# Patient Record
Sex: Female | Born: 1975
Health system: Southern US, Community
[De-identification: ages and names within clinical notes are randomized; demographics above are authoritative.]

## PROBLEM LIST (undated history)

## (undated) DIAGNOSIS — F419 Anxiety disorder, unspecified: Secondary | ICD-10-CM

## (undated) DIAGNOSIS — K219 Gastro-esophageal reflux disease without esophagitis: Secondary | ICD-10-CM

## (undated) DIAGNOSIS — I10 Essential (primary) hypertension: Secondary | ICD-10-CM

## (undated) DIAGNOSIS — D649 Anemia, unspecified: Secondary | ICD-10-CM

## (undated) DIAGNOSIS — Z862 Personal history of diseases of the blood and blood-forming organs and certain disorders involving the immune mechanism: Secondary | ICD-10-CM

## (undated) DIAGNOSIS — E785 Hyperlipidemia, unspecified: Secondary | ICD-10-CM

## (undated) DIAGNOSIS — M109 Gout, unspecified: Secondary | ICD-10-CM

## (undated) DIAGNOSIS — R519 Headache, unspecified: Secondary | ICD-10-CM

## (undated) HISTORY — DX: Anemia, unspecified: D64.9

## (undated) HISTORY — PX: TUBAL LIGATION: SHX77

---

## 1997-05-11 ENCOUNTER — Inpatient Hospital Stay (HOSPITAL_COMMUNITY): Admission: AD | Admit: 1997-05-11 | Discharge: 1997-05-11 | Payer: Self-pay | Admitting: Obstetrics

## 1997-06-28 ENCOUNTER — Inpatient Hospital Stay (HOSPITAL_COMMUNITY): Admission: EM | Admit: 1997-06-28 | Discharge: 1997-06-28 | Payer: Self-pay | Admitting: Obstetrics

## 1998-04-14 ENCOUNTER — Inpatient Hospital Stay (HOSPITAL_COMMUNITY): Admission: AD | Admit: 1998-04-14 | Discharge: 1998-04-14 | Payer: Self-pay | Admitting: Obstetrics

## 1998-07-24 ENCOUNTER — Inpatient Hospital Stay (HOSPITAL_COMMUNITY): Admission: EM | Admit: 1998-07-24 | Discharge: 1998-07-24 | Payer: Self-pay | Admitting: Obstetrics

## 1998-08-16 ENCOUNTER — Encounter: Payer: Self-pay | Admitting: Emergency Medicine

## 1998-08-16 ENCOUNTER — Emergency Department (HOSPITAL_COMMUNITY): Admission: EM | Admit: 1998-08-16 | Discharge: 1998-08-16 | Payer: Self-pay | Admitting: Emergency Medicine

## 1999-07-23 ENCOUNTER — Emergency Department (HOSPITAL_COMMUNITY): Admission: EM | Admit: 1999-07-23 | Discharge: 1999-07-23 | Payer: Self-pay | Admitting: Emergency Medicine

## 1999-08-07 ENCOUNTER — Other Ambulatory Visit: Admission: RE | Admit: 1999-08-07 | Discharge: 1999-08-07 | Payer: Self-pay | Admitting: Obstetrics

## 2000-01-22 ENCOUNTER — Inpatient Hospital Stay: Admission: AD | Admit: 2000-01-22 | Discharge: 2000-01-22 | Payer: Self-pay | Admitting: Obstetrics

## 2000-03-02 ENCOUNTER — Inpatient Hospital Stay (HOSPITAL_COMMUNITY): Admission: AD | Admit: 2000-03-02 | Discharge: 2000-03-02 | Payer: Self-pay | Admitting: Obstetrics

## 2000-03-17 ENCOUNTER — Inpatient Hospital Stay (HOSPITAL_COMMUNITY): Admission: AD | Admit: 2000-03-17 | Discharge: 2000-03-17 | Payer: Self-pay | Admitting: Obstetrics

## 2000-03-24 ENCOUNTER — Inpatient Hospital Stay (HOSPITAL_COMMUNITY): Admission: AD | Admit: 2000-03-24 | Discharge: 2000-03-24 | Payer: Self-pay | Admitting: Obstetrics

## 2000-03-28 ENCOUNTER — Inpatient Hospital Stay (HOSPITAL_COMMUNITY): Admission: AD | Admit: 2000-03-28 | Discharge: 2000-03-28 | Payer: Self-pay | Admitting: Obstetrics

## 2000-04-01 ENCOUNTER — Inpatient Hospital Stay (HOSPITAL_COMMUNITY): Admission: AD | Admit: 2000-04-01 | Discharge: 2000-04-03 | Payer: Self-pay | Admitting: Obstetrics and Gynecology

## 2000-04-01 ENCOUNTER — Encounter: Payer: Self-pay | Admitting: Obstetrics

## 2001-12-16 ENCOUNTER — Emergency Department (HOSPITAL_COMMUNITY): Admission: EM | Admit: 2001-12-16 | Discharge: 2001-12-16 | Payer: Self-pay | Admitting: Emergency Medicine

## 2002-04-08 HISTORY — PX: TUBAL LIGATION: SHX77

## 2002-06-15 ENCOUNTER — Inpatient Hospital Stay (HOSPITAL_COMMUNITY): Admission: AD | Admit: 2002-06-15 | Discharge: 2002-06-15 | Payer: Self-pay | Admitting: Obstetrics

## 2002-06-26 ENCOUNTER — Inpatient Hospital Stay (HOSPITAL_COMMUNITY): Admission: AD | Admit: 2002-06-26 | Discharge: 2002-06-26 | Payer: Self-pay | Admitting: Obstetrics

## 2002-07-27 ENCOUNTER — Encounter: Payer: Self-pay | Admitting: Obstetrics

## 2002-07-27 ENCOUNTER — Inpatient Hospital Stay (HOSPITAL_COMMUNITY): Admission: AD | Admit: 2002-07-27 | Discharge: 2002-07-27 | Payer: Self-pay | Admitting: Obstetrics

## 2003-02-03 ENCOUNTER — Inpatient Hospital Stay (HOSPITAL_COMMUNITY): Admission: AD | Admit: 2003-02-03 | Discharge: 2003-02-07 | Payer: Self-pay | Admitting: Obstetrics

## 2003-02-04 ENCOUNTER — Encounter (INDEPENDENT_AMBULATORY_CARE_PROVIDER_SITE_OTHER): Payer: Self-pay | Admitting: *Deleted

## 2004-05-15 ENCOUNTER — Emergency Department (HOSPITAL_COMMUNITY): Admission: EM | Admit: 2004-05-15 | Discharge: 2004-05-15 | Payer: Self-pay | Admitting: *Deleted

## 2004-12-08 ENCOUNTER — Inpatient Hospital Stay (HOSPITAL_COMMUNITY): Admission: AD | Admit: 2004-12-08 | Discharge: 2004-12-08 | Payer: Self-pay | Admitting: *Deleted

## 2005-09-27 ENCOUNTER — Inpatient Hospital Stay (HOSPITAL_COMMUNITY): Admission: AD | Admit: 2005-09-27 | Discharge: 2005-09-27 | Payer: Self-pay | Admitting: Obstetrics & Gynecology

## 2006-04-08 DIAGNOSIS — D259 Leiomyoma of uterus, unspecified: Secondary | ICD-10-CM

## 2006-05-15 ENCOUNTER — Emergency Department (HOSPITAL_COMMUNITY): Admission: EM | Admit: 2006-05-15 | Discharge: 2006-05-15 | Payer: Self-pay | Admitting: Family Medicine

## 2006-10-12 ENCOUNTER — Inpatient Hospital Stay (HOSPITAL_COMMUNITY): Admission: AD | Admit: 2006-10-12 | Discharge: 2006-10-12 | Payer: Self-pay | Admitting: Obstetrics & Gynecology

## 2006-10-15 ENCOUNTER — Encounter: Payer: Self-pay | Admitting: Obstetrics & Gynecology

## 2006-10-15 ENCOUNTER — Ambulatory Visit: Payer: Self-pay | Admitting: Obstetrics & Gynecology

## 2006-10-15 ENCOUNTER — Other Ambulatory Visit: Admission: RE | Admit: 2006-10-15 | Discharge: 2006-10-15 | Payer: Self-pay | Admitting: Obstetrics and Gynecology

## 2006-10-15 ENCOUNTER — Encounter: Payer: Self-pay | Admitting: Obstetrics and Gynecology

## 2006-10-17 ENCOUNTER — Ambulatory Visit (HOSPITAL_COMMUNITY): Admission: RE | Admit: 2006-10-17 | Discharge: 2006-10-17 | Payer: Self-pay | Admitting: Obstetrics and Gynecology

## 2006-10-29 ENCOUNTER — Ambulatory Visit: Payer: Self-pay | Admitting: Obstetrics & Gynecology

## 2006-12-31 ENCOUNTER — Emergency Department (HOSPITAL_COMMUNITY): Admission: EM | Admit: 2006-12-31 | Discharge: 2006-12-31 | Payer: Self-pay | Admitting: Family Medicine

## 2007-05-25 ENCOUNTER — Emergency Department (HOSPITAL_COMMUNITY): Admission: EM | Admit: 2007-05-25 | Discharge: 2007-05-25 | Payer: Self-pay | Admitting: Family Medicine

## 2007-10-02 ENCOUNTER — Emergency Department (HOSPITAL_COMMUNITY): Admission: EM | Admit: 2007-10-02 | Discharge: 2007-10-02 | Payer: Self-pay | Admitting: Emergency Medicine

## 2008-01-17 ENCOUNTER — Emergency Department (HOSPITAL_COMMUNITY): Admission: EM | Admit: 2008-01-17 | Discharge: 2008-01-17 | Payer: Self-pay | Admitting: Family Medicine

## 2008-02-18 ENCOUNTER — Ambulatory Visit: Payer: Self-pay | Admitting: Nurse Practitioner

## 2008-02-18 DIAGNOSIS — F17200 Nicotine dependence, unspecified, uncomplicated: Secondary | ICD-10-CM

## 2008-04-27 ENCOUNTER — Ambulatory Visit: Payer: Self-pay | Admitting: Nurse Practitioner

## 2008-04-27 ENCOUNTER — Encounter (INDEPENDENT_AMBULATORY_CARE_PROVIDER_SITE_OTHER): Payer: Self-pay | Admitting: Nurse Practitioner

## 2008-04-27 DIAGNOSIS — E669 Obesity, unspecified: Secondary | ICD-10-CM

## 2008-04-27 LAB — CONVERTED CEMR LAB
Bilirubin Urine: NEGATIVE
Glucose, Urine, Semiquant: NEGATIVE
KOH Prep: NEGATIVE
Protein, U semiquant: NEGATIVE
Specific Gravity, Urine: 1.01

## 2008-04-28 ENCOUNTER — Encounter (INDEPENDENT_AMBULATORY_CARE_PROVIDER_SITE_OTHER): Payer: Self-pay | Admitting: Nurse Practitioner

## 2008-04-28 LAB — CONVERTED CEMR LAB
ALT: 21 units/L (ref 0–35)
AST: 22 units/L (ref 0–37)
Albumin: 4.3 g/dL (ref 3.5–5.2)
Alkaline Phosphatase: 71 units/L (ref 39–117)
Basophils Absolute: 0.1 10*3/uL (ref 0.0–0.1)
Basophils Relative: 1 % (ref 0–1)
Eosinophils Absolute: 0.1 10*3/uL (ref 0.0–0.7)
LDL Cholesterol: 29 mg/dL (ref 0–99)
Lymphs Abs: 2.3 10*3/uL (ref 0.7–4.0)
MCHC: 28.7 g/dL — ABNORMAL LOW (ref 30.0–36.0)
MCV: 67.4 fL — ABNORMAL LOW (ref 78.0–100.0)
Neutrophils Relative %: 67 % (ref 43–77)
Platelets: 302 10*3/uL (ref 150–400)
Potassium: 3.9 meq/L (ref 3.5–5.3)
RDW: 18.6 % — ABNORMAL HIGH (ref 11.5–15.5)
Sodium: 143 meq/L (ref 135–145)
TSH: 1.366 microintl units/mL (ref 0.350–4.50)
Total Protein: 7.4 g/dL (ref 6.0–8.3)
VLDL: 32 mg/dL (ref 0–40)
WBC: 9 10*3/uL (ref 4.0–10.5)

## 2008-04-29 ENCOUNTER — Telehealth (INDEPENDENT_AMBULATORY_CARE_PROVIDER_SITE_OTHER): Payer: Self-pay | Admitting: Nurse Practitioner

## 2008-04-29 ENCOUNTER — Encounter (INDEPENDENT_AMBULATORY_CARE_PROVIDER_SITE_OTHER): Payer: Self-pay | Admitting: Nurse Practitioner

## 2008-04-29 DIAGNOSIS — D649 Anemia, unspecified: Secondary | ICD-10-CM

## 2008-04-29 LAB — CONVERTED CEMR LAB: Retic Ct Pct: 1.4 % (ref 0.4–3.1)

## 2008-06-07 ENCOUNTER — Emergency Department (HOSPITAL_COMMUNITY): Admission: EM | Admit: 2008-06-07 | Discharge: 2008-06-07 | Payer: Self-pay | Admitting: Family Medicine

## 2008-09-21 ENCOUNTER — Inpatient Hospital Stay (HOSPITAL_COMMUNITY): Admission: AD | Admit: 2008-09-21 | Discharge: 2008-09-21 | Payer: Self-pay | Admitting: Obstetrics & Gynecology

## 2008-10-26 ENCOUNTER — Ambulatory Visit: Payer: Self-pay | Admitting: Obstetrics & Gynecology

## 2009-01-18 ENCOUNTER — Ambulatory Visit: Payer: Self-pay | Admitting: Obstetrics and Gynecology

## 2009-01-18 ENCOUNTER — Encounter: Payer: Self-pay | Admitting: Obstetrics and Gynecology

## 2009-01-18 ENCOUNTER — Encounter: Payer: Self-pay | Admitting: Obstetrics & Gynecology

## 2009-01-18 LAB — CONVERTED CEMR LAB
MCHC: 29.2 g/dL — ABNORMAL LOW (ref 30.0–36.0)
Platelets: 356 10*3/uL (ref 150–400)
RDW: 18.2 % — ABNORMAL HIGH (ref 11.5–15.5)

## 2009-01-24 ENCOUNTER — Ambulatory Visit (HOSPITAL_COMMUNITY): Admission: RE | Admit: 2009-01-24 | Discharge: 2009-01-24 | Payer: Self-pay | Admitting: Family Medicine

## 2009-02-08 ENCOUNTER — Ambulatory Visit: Payer: Self-pay | Admitting: Obstetrics & Gynecology

## 2009-02-22 ENCOUNTER — Ambulatory Visit (HOSPITAL_COMMUNITY): Admission: RE | Admit: 2009-02-22 | Discharge: 2009-02-22 | Payer: Self-pay | Admitting: Obstetrics and Gynecology

## 2009-02-22 ENCOUNTER — Ambulatory Visit: Payer: Self-pay | Admitting: Obstetrics and Gynecology

## 2009-03-15 ENCOUNTER — Ambulatory Visit: Payer: Self-pay | Admitting: Obstetrics and Gynecology

## 2009-12-22 ENCOUNTER — Emergency Department (HOSPITAL_COMMUNITY): Admission: EM | Admit: 2009-12-22 | Discharge: 2009-12-22 | Payer: Self-pay | Admitting: Family Medicine

## 2010-03-09 ENCOUNTER — Emergency Department (HOSPITAL_COMMUNITY)
Admission: EM | Admit: 2010-03-09 | Discharge: 2010-03-09 | Payer: Self-pay | Source: Home / Self Care | Admitting: Emergency Medicine

## 2010-06-21 LAB — DIFFERENTIAL
Basophils Relative: 0 % (ref 0–1)
Eosinophils Relative: 2 % (ref 0–5)
Lymphocytes Relative: 22 % (ref 12–46)
Monocytes Relative: 4 % (ref 3–12)
Neutro Abs: 6.2 10*3/uL (ref 1.7–7.7)
Neutrophils Relative %: 72 % (ref 43–77)

## 2010-06-21 LAB — COMPREHENSIVE METABOLIC PANEL
AST: 17 U/L (ref 0–37)
Albumin: 4 g/dL (ref 3.5–5.2)
BUN: 7 mg/dL (ref 6–23)
Calcium: 9.2 mg/dL (ref 8.4–10.5)
Chloride: 105 mEq/L (ref 96–112)
Creatinine, Ser: 0.66 mg/dL (ref 0.4–1.2)
GFR calc Af Amer: >60 mL/min (ref >60)
Total Bilirubin: 0.6 mg/dL (ref 0.3–1.2)
Total Protein: 7.5 g/dL (ref 6.0–8.3)

## 2010-06-21 LAB — POCT URINALYSIS DIPSTICK
Bilirubin Urine: NEGATIVE
Glucose, UA: NEGATIVE mg/dL
Nitrite: NEGATIVE
Urobilinogen, UA: 0.2 mg/dL (ref 0.0–1.0)
pH: 6.5 (ref 5.0–8.0)

## 2010-06-21 LAB — CBC
MCH: 19.8 pg — ABNORMAL LOW (ref 26.0–34.0)
MCV: 65.8 fL — ABNORMAL LOW (ref 78.0–100.0)
Platelets: 383 10*3/uL (ref 150–400)
RBC: 4.65 MIL/uL (ref 3.87–5.11)
RDW: 18.4 % — ABNORMAL HIGH (ref 11.5–15.5)

## 2010-07-11 LAB — CBC
HCT: 32.3 % — ABNORMAL LOW (ref 36.0–46.0)
Hemoglobin: 10.2 g/dL — ABNORMAL LOW (ref 12.0–15.0)
MCHC: 31.4 g/dL (ref 30.0–36.0)
MCV: 71.3 fL — ABNORMAL LOW (ref 78.0–100.0)
RBC: 4.53 MIL/uL (ref 3.87–5.11)
WBC: 9.2 10*3/uL (ref 4.0–10.5)

## 2010-07-11 LAB — PREGNANCY, URINE: Preg Test, Ur: NEGATIVE

## 2010-08-21 NOTE — Group Therapy Note (Signed)
NAME:  Rebecca Joseph, Rebecca Joseph NO.:  [id number]   MEDICAL RECORD NO.:  [id number]          PATIENT TYPE:  WOC   LOCATION:  [facility]                   FACILITY:  [facility]   PHYSICIAN:  Elsie Lincoln, MD      DATE OF BIRTH:  [birth year]   DATE OF SERVICE:  10/15/2006                                  CLINIC NOTE   The patient is a 35 year old G4, para 3-0-1-3 who is coming here for  workup of menorrhagia.  The patient complains she has periods that last  7 or 8 days, very heavy flow, with severe pain.  She also has some  bleeding between periods.  She has never had this worked up before.  She  also has irregular cycles.  Her last cycle was September 08, 2006, and she is  probably going to start sometime today she thinks.  The patient is also  being treated with antibiotics for a urinary tract infection from the  MAU.  She has recurrent UTIs; however, she does wipe inappropriately  back to front.  We went over this, and she is going to wipe front to  back.  The patient uses tubal ligation for birth control.  She has not  had a Pap smear in many years.  She thinks that she gets them in the MAU  when she goes for pelvics; however, they do not do Pap smears in that  facility.   PAST MEDICAL HISTORY:  Denies all medical problems.   PAST SURGICAL HISTORY:  Bilateral tubal ligation.   OB HISTORY:  NSVD x2.  Miscarriage x1.  C-section x1.   GYN HISTORY:  No history of ovarian cysts, fibroid tumors, or Pap smears  that are not normal.  She has had gonorrhea and Trichomonas in the past.  Again, she uses a tubal for birth control.   FAMILY HISTORY:  Father is positive for diabetes, father is positive for  heart attack, and father is positive for high blood pressure.   SOCIAL HISTORY:  Minimal alcohol intake.  No tobacco.  No drugs.  No  history of abuse.   REVIEW OF SYSTEMS:  Positive for fevers, night sweats, hot flashes, and  recurrent urinary tract infections.   MEDICATIONS:   Antibiotic.   ALLERGIES:  NAPROXEN.   PHYSICAL EXAMINATION:  GENERAL:  Well-nourished, well-developed, no  apparent distress.  VITAL SIGNS:  Temperature 97.5, pulse 95, blood pressure 119/81.  Height  5 feet 6 inches, weight 218 pounds.  HEENT:  Normocephalic, atraumatic.  ABDOMEN:  Obese, soft, nontender.  No organomegaly.  No hernia.  GENITALIA:  Tanner 5.  Vagina:  Pink with normal rugae.  Cervix:  Large  with very large nabothian cyst on anterior lip.  Uterus anteverted.  Adnexa with no masses.  Uterus sounds to 6 cm.   ASSESSMENT AND PLAN:  35 year old female with menometrorrhagia, painful  periods.  1. Pap smear, cultures.  2. Endometrial biopsy done after negative urinary chorionic      gonadotropins and informed consent.  Again, the uterus sounds to 6      cm which hopefully are in the uterine canal and not just in the  endocervical canal.  Two passes were done.  There was abundant      mucus.  3.  Get a complete blood count today to see if she is      anemic.  3. Transvaginal ultrasound to rule out fibroids, polyps.  4. Return to clinic in 2 weeks.  5. The patient might be a candidate for intrauterine device or      dilatation and curettage/hysteroscopy if polyp or fibroid is noted.      The patient could also be a candidate for ablation.           ______________________________  Elsie Lincoln, MD     KL/MEDQ  D:  10/15/2006  T:  10/16/2006  Job:  [id number]

## 2010-08-24 NOTE — H&P (Signed)
   NAMECALIOPE, RUPPERT                      ACCOUNT NO.:  [id number]   MEDICAL RECORD NO.:  [id number]                   PATIENT TYPE:  INP   LOCATION:  9122                                 [facility]ILITY[facility]  [facility]   PHYSICIAN:  Kathreen Cosier, M.D.           DATE OF BIRTH:  Nov 15, 1975   DATE OF ADMISSION:  02/03/2003  DATE OF DISCHARGE:                                HISTORY & PHYSICAL   HISTORY OF PRESENT ILLNESS:  The patient is a 35 year old gravida 4, para 2-  0-1-2, [facility] February 16, 2003.  Came to the hospital because of decreased  fetal movements past 24 hours.  It was known that the fetus had an  arrhythmia in the later part of the pregnancy and the fetal heart usually  ranged between 140-160.  She was also seen by [facility] perin[facility]ology in  consultation.  The patient was not contracting.  However, we were unable to  monitor the baby because of the severity of the arrhythmia and because of  that fact with decreased movement, it was decided to deliver her by cesarean  section.   PHYSICAL EXAMINATION:  GENERAL:  Well-developed female in no distress.  HEENT:  Negative.  LUNGS:  Clear.  HEART:  Regular rhythm.  No murmurs or gallops.  BREASTS:  Negative.  ABDOMEN:  Term size.  EXTREMITIES:  Negative.                                               Kathreen Cosier, M.D.    BAM/MEDQ  D:  02/04/2003  T:  02/04/2003  Job:  [id number]

## 2010-08-24 NOTE — Discharge Summary (Signed)
   Rebecca Joseph, Rebecca Joseph                      ACCOUNT NO.:  [id number]   MEDICAL RECORD NO.:  [id number]                   PATIENT TYPE:  INP   LOCATION:  9122                                 [facility]   PHYSICIAN:  Kathreen Cosier, M.D.           DATE OF BIRTH:  March 16, 1976   DATE OF ADMISSION:  02/03/2003  DATE OF DISCHARGE:  02/07/2003                                 DISCHARGE SUMMARY   HISTORY OF PRESENT ILLNESS:  The patient is a 35 year old gravida 4, para 2-  0-1-2, [facility] February 16, 2003 who came to the triage with decreased fetal  movement.  We knew there was a fetal arrhythmia.  However, the arrhythmia  was more severe and had been seen at [facility] in consult.  We  were unable to monitor the baby because of the severity of the arrhythmia  and it was decided that she would be delivered by cesarean section because  of the severe nature of this arrhythmia.  She underwent a primary low  transverse cesarean section and tubal ligation.  She had a female, Apgar 8/8  with a nuchal cord.  Baby weighed 7 pounds 10 ounces.  She also had tubal  ligation performed.  On admission her hemoglobin was 9.1 and postoperative  6.8.  She was asymptomatic.  Did well and was discharged home on the third  postoperative day ambulatory, on a regular diet, on Tylox for pain and  ferrous sulfate for her anemia.   DISCHARGE DIAGNOSES:  Status post elective cesarean section at 38 weeks  because of severe cardiac arrhythmia in the fetus.                                               Kathreen Cosier, M.D.    BAM/MEDQ  D:  02/07/2003  T:  02/07/2003  Job:  [id number]

## 2010-08-24 NOTE — Op Note (Signed)
   Rebecca Joseph, Rebecca Joseph NO.:  [id number]   MEDICAL RECORD NO.:  [id number]                   PATIENT TYPE:  INP   LOCATION:  9199                                 [facility]   PHYSICIAN:  Kathreen Cosier, M.D.           DATE OF BIRTH:  1975/07/01   DATE OF PROCEDURE:  02/04/2003  DATE OF DISCHARGE:                                 OPERATIVE REPORT   SURGEON:  Kathreen Cosier, M.D.   ASSISTANT:  Charles A. Clearance Coots, M.D.   ANESTHESIA:  Spinal.   DESCRIPTION OF PROCEDURE:  The patient was placed on the operating table in  the supine position.  Abdomen prepped and draped.  Bladder emptied with a  Foley catheter.  A transverse suprapubic incision made, carried down to the  rectus fascia.  The fascia cleaned and incised the length of the incision.  Recti muscles retracted laterally, the peritoneum incised longitudinally.  A  transverse incision made in the visceral peritoneum above the bladder and  the bladder mobilized inferiorly.  A transverse low uterine incision was  made with scissors.  The patient delivered from the LOA position of a female,  Apgar 8 and 8, weighing 7 pounds 10 ounces.  There was a nuchal cord which  was loose.  The placenta was posterior and removed manually.  The uterine  cavity cleaned with dry laps.  The uterine incision closed in one layer with  a continuous suture of #1 chromic.  Hemostasis was satisfactory.  The  bladder flap reattached with 2-0 chromic.  The uterus was well-contracted,  tubes and ovaries were normal.  The right tube grasped in the midportion  with a Babcock clamp, 0 plain suture placed in the mesosalpinx below the  portion of the tube which had been clamped.  This was tied and the tube cut.  Hemostasis was satisfactory.  The procedure done in a similar fashion on the  other side.  Lap and sponge counts correct.  Abdomen closed in layers, the  peritoneum with a continuous suture of 0 chromic, the  fascia with a  continuous suture of 0 Dexon, and the skin closed with subcuticular stitch  of 3-0 Monocryl.  Blood loss 600 mL.  The patient tolerated the procedure  well, taken to the recovery room in good condition.                                               Kathreen Cosier, M.D.    BAM/MEDQ  D:  02/04/2003  T:  02/04/2003  Job:  [id number]

## 2011-01-03 LAB — POCT CARDIAC MARKERS
CKMB, poc: 1.1
Troponin i, poc: <0.05

## 2011-01-08 LAB — POCT URINALYSIS DIP (DEVICE)
Bilirubin Urine: NEGATIVE
Glucose, UA: NEGATIVE
Nitrite: NEGATIVE
Urobilinogen, UA: 0.2

## 2011-01-08 LAB — POCT PREGNANCY, URINE: Preg Test, Ur: NEGATIVE

## 2011-01-08 LAB — URINE CULTURE: Colony Count: 85000

## 2011-01-22 LAB — URINALYSIS, ROUTINE W REFLEX MICROSCOPIC
Bilirubin Urine: NEGATIVE
Glucose, UA: NEGATIVE
Nitrite: NEGATIVE
Specific Gravity, Urine: 1.025
pH: 6

## 2011-01-22 LAB — URINE MICROSCOPIC-ADD ON

## 2011-08-20 ENCOUNTER — Ambulatory Visit (INDEPENDENT_AMBULATORY_CARE_PROVIDER_SITE_OTHER): Payer: Self-pay | Admitting: *Deleted

## 2011-08-20 VITALS — BP 134/97 | HR 99 | Temp 97.7°F | Ht 65.5 in | Wt 171.2 lb

## 2011-08-20 DIAGNOSIS — Z01419 Encounter for gynecological examination (general) (routine) without abnormal findings: Secondary | ICD-10-CM

## 2011-08-20 NOTE — Progress Notes (Signed)
No complaints today.  Pap Smear:    Pap smear completed today. Patients last Pap smear was 02/05/09 at the [facility] and normal. Prior Pap smear on 04/27/08 was normal. Per patient no history of abnormal Pap smears but stated she has a history of genital warts. Pap smear results above are in EPIC.  Physical exam: Breasts Breasts symmetrical. No skin abnormalities bilateral breasts. No nipple retraction bilateral breasts. No nipple discharge bilateral breasts. No lymphadenopathy. No lumps palpated bilateral breasts. No complaints of pain or tenderness on exam.     Pelvic/Bimanual   Ext Genitalia No lesions, no swelling and no discharge observed on external genitalia.         Vagina Vagina pink and normal texture. No lesions or discharge observed in vagina.          Cervix Cervix is present. Cervix pink and of normal texture. Small amount of blood observed at cervical os related to menstruation.    Uterus Uterus is present and palpable. Uterus in normal position and normal size.        Adnexae Bilateral ovaries present and palpable. No tenderness on palpation.          Rectovaginal No rectal exam completed today since patient had no rectal complaints. No skin abnormalities observed on exam.

## 2011-08-20 NOTE — Patient Instructions (Addendum)
Taught patient how to perform BSE. Let her know [facility] will cover Pap smears every 3 years unless has a history of abnormal Pap smears. Since patient has a history of genital warts recommended that she have a Pap smear at 1-2 years. Let her know about the free Pap smear screenings offered at the [facility] if would like a Pap smear at 1-2 years. Let patient know will follow up with her within the next couple weeks with results. Talked with patient about her elevated BP of 134/97. Per patient does not usually have a high BP. Recommended she have her BP taken several different times and keep a log of results. Gave patient a log to fill out with BP results. Told patient if BP continues to be elevated that she needs to follow up with PCP. Patient verbalized understanding.

## 2011-08-26 ENCOUNTER — Encounter: Payer: Self-pay | Admitting: *Deleted

## 2013-12-03 ENCOUNTER — Encounter (HOSPITAL_COMMUNITY): Payer: Self-pay | Admitting: Emergency Medicine

## 2013-12-03 ENCOUNTER — Emergency Department (HOSPITAL_COMMUNITY)
Admission: EM | Admit: 2013-12-03 | Discharge: 2013-12-03 | Disposition: A | Discharge status: Home / Self Care | Payer: Self-pay | Attending: Emergency Medicine | Admitting: Emergency Medicine

## 2013-12-03 DIAGNOSIS — J029 Acute pharyngitis, unspecified: Secondary | ICD-10-CM | POA: Insufficient documentation

## 2013-12-03 DIAGNOSIS — R599 Enlarged lymph nodes, unspecified: Secondary | ICD-10-CM | POA: Insufficient documentation

## 2013-12-03 DIAGNOSIS — R51 Headache: Secondary | ICD-10-CM | POA: Insufficient documentation

## 2013-12-03 DIAGNOSIS — J020 Streptococcal pharyngitis: Secondary | ICD-10-CM | POA: Insufficient documentation

## 2013-12-03 DIAGNOSIS — Z862 Personal history of diseases of the blood and blood-forming organs and certain disorders involving the immune mechanism: Secondary | ICD-10-CM | POA: Insufficient documentation

## 2013-12-03 LAB — RAPID STREP SCREEN (MED CTR MEBANE ONLY): STREPTOCOCCUS, GROUP A SCREEN (DIRECT): POSITIVE — AB

## 2013-12-03 MED ORDER — PENICILLIN G BENZATHINE 1200000 UNIT/2ML IM SUSP
1.2000 10*6.[IU] | Freq: Once | INTRAMUSCULAR | Status: AC
  Administered 2013-12-03: 1.2 10*6.[IU] via INTRAMUSCULAR
  Filled 2013-12-03: qty 2

## 2013-12-03 MED ORDER — LIDOCAINE VISCOUS 2 % MT SOLN: 20.0000 mL | OROMUCOSAL | Status: DC | PRN

## 2013-12-03 NOTE — ED Provider Notes (Signed)
  This was a shared visit with a mid-level provided (NP or PA).  Throughout the patient's course I was available for consultation/collaboration.  I saw the relevant labs and studies - I agree with the interpretation.  On my exam the patient was in no distress.  She had a patent airway, and although she had sore throat, with exudative lesions, was in and no systemic distress.       Carmin Muskrat, MD 12/03/13 [phone]

## 2013-12-03 NOTE — ED Provider Notes (Signed)
CSN: [id number]     Arrival date & time 12/03/13  0815 History   First MD Initiated Contact with Patient 12/03/13 [phone]     Chief Complaint  Patient presents with  . Headache  . Sore Throat     (Consider location/radiation/quality/duration/timing/severity/associated sxs/prior Treatment) HPI Comments: Patient presents today with a chief complaint of headache and sore throat.  She reports that she began having a sore throat 2 days ago, which is gradually worsening.  She states that pain is worse with swallowing.  She has been taken Ibuprofen and OTC decongestant for her symptoms with mild relief.  She also reports that last evening she developed a frontal headache.  Headache gradual in onset.  Headache improved with Ibuprofen.  Headache similar to headache she has had in the past.  She reports associated subjective fever, chills, bilateral ear pain, and congestion.  She denies cough, chest pain, SOB, nausea, vomiting, rash, neck pain/stiffness, vision changes, or weakness.  She reports that her son was recently diagnosed with Strep Throat five days ago.    The history is provided by the patient.    Past Medical History  Diagnosis Date  . Anemia    Past Surgical History  Procedure Laterality Date  . Tubal ligation    . Cesarean section     Family History  Problem Relation Age of Onset  . Diabetes Father   . Hypertension Father   . Heart disease Father   . Hypertension Mother    History  Substance Use Topics  . Smoking status: Never Smoker   . Smokeless tobacco: Never Used  . Alcohol Use: 1.8 oz/week    3 Glasses of wine per week   OB History   Grav Para Term Preterm Abortions TAB SAB Ect Mult Living   4 3 3  1  1   3      Review of Systems  All other systems reviewed and are negative.     Allergies  Naproxen  Home Medications   Prior to Admission medications   Medication Sig Start Date End Date Taking? Authorizing Provider  ibuprofen (ADVIL,MOTRIN) 200 MG tablet  Take 400 mg by mouth every 6 (six) hours as needed for headache or moderate pain.   Yes Historical Provider, MD   BP 115/71  Pulse 98  Temp(Src) 98.4 F (36.9 C) (Oral)  Resp 14  Ht 5\' 5"  (1.651 m)  Wt 170 lb (77.111 kg)  BMI 28.29 kg/m2  SpO2 100%  LMP 10/30/2013 Physical Exam  Nursing note and vitals reviewed. Constitutional: She appears well-developed and well-nourished.  HENT:  Head: Normocephalic and atraumatic.  Right Ear: Tympanic membrane and ear canal normal.  Left Ear: Tympanic membrane and ear canal normal.  Nose: Nose normal.  Mouth/Throat: Uvula is midline. No trismus in the jaw. Oropharyngeal exudate and posterior oropharyngeal erythema present.  Bilateral tonsillar exudate with mild bilateral tonsillar enlargement. Uvula midline Mild erythema of oropharynx Normal voice phonation Patient handling secretions well, no drooling   Eyes: EOM are normal. Pupils are equal, round, and reactive to light.  Neck: Normal range of motion. Neck supple.  Cardiovascular: Normal rate, regular rhythm and normal heart sounds.   Pulmonary/Chest: Effort normal and breath sounds normal.  Musculoskeletal: Normal range of motion.  Lymphadenopathy:    She has cervical adenopathy.  Neurological: She is alert. She has normal strength. No cranial nerve deficit or sensory deficit. Gait normal.  Skin: Skin is warm and dry. No rash noted.  Psychiatric: She has  a normal mood and affect.    ED Course  Procedures (including critical care time) Labs Review Labs Reviewed  RAPID STREP SCREEN - Abnormal; Notable for the following:    Streptococcus, Group A Screen (Direct) POSITIVE (*)    All other components within normal limits    Imaging Review No results found.   EKG Interpretation None      MDM   Final diagnoses:  None   Patient presenting with sore throat and headache.  Rapid strep positive.  Patient given IM PCN in the ED.  No signs of Peritonsillar or Retropharyngeal  Abscess at this time.  Patient also complaining of a headache.  Headache gradual in onset.  Normal neurological exam.  No nuchal rigidity.  Similar to prior headache.  Headache relieved with Ibuprofen.  Therefore, do not feel that any imaging or further work up is indicated.  Patient stable for discharge.  Return precautions given.    Hyman Bible, PA-C 12/03/13 1025

## 2013-12-03 NOTE — ED Notes (Signed)
Pt states that her son recently has strep throat. Pt states that she has had a headache and sore throat for several days now. Pt states that she has had some yellow nasal drainage as well.  NAD noted.

## 2014-02-07 ENCOUNTER — Encounter (HOSPITAL_COMMUNITY): Payer: Self-pay | Admitting: Emergency Medicine

## 2015-09-08 ENCOUNTER — Emergency Department (HOSPITAL_COMMUNITY)
Admission: EM | Admit: 2015-09-08 | Discharge: 2015-09-08 | Disposition: A | Discharge status: Home / Self Care | Payer: Self-pay | Attending: Emergency Medicine | Admitting: Emergency Medicine

## 2015-09-08 ENCOUNTER — Encounter (HOSPITAL_COMMUNITY): Payer: Self-pay | Admitting: *Deleted

## 2015-09-08 DIAGNOSIS — Z79899 Other long term (current) drug therapy: Secondary | ICD-10-CM | POA: Insufficient documentation

## 2015-09-08 DIAGNOSIS — A084 Viral intestinal infection, unspecified: Secondary | ICD-10-CM | POA: Insufficient documentation

## 2015-09-08 DIAGNOSIS — R51 Headache: Secondary | ICD-10-CM | POA: Insufficient documentation

## 2015-09-08 LAB — COMPREHENSIVE METABOLIC PANEL
ALT: 13 U/L — ABNORMAL LOW (ref 14–54)
ANION GAP: 7 (ref 5–15)
AST: 19 U/L (ref 15–41)
Albumin: 3.4 g/dL — ABNORMAL LOW (ref 3.5–5.0)
Alkaline Phosphatase: 52 U/L (ref 38–126)
BUN: 8 mg/dL (ref 6–20)
CALCIUM: 8.8 mg/dL — AB (ref 8.9–10.3)
CHLORIDE: 104 mmol/L (ref 101–111)
CO2: 23 mmol/L (ref 22–32)
Creatinine, Ser: 0.8 mg/dL (ref 0.44–1.00)
GFR calc non Af Amer: >60 mL/min (ref >60)
Glucose, Bld: 114 mg/dL — ABNORMAL HIGH (ref 65–99)
Potassium: 3.2 mmol/L — ABNORMAL LOW (ref 3.5–5.1)
SODIUM: 134 mmol/L — AB (ref 135–145)
Total Bilirubin: 1 mg/dL (ref 0.3–1.2)
Total Protein: 7.4 g/dL (ref 6.5–8.1)

## 2015-09-08 LAB — URINALYSIS, ROUTINE W REFLEX MICROSCOPIC
Bilirubin Urine: NEGATIVE
Glucose, UA: NEGATIVE mg/dL
Ketones, ur: 15 mg/dL — AB
LEUKOCYTES UA: NEGATIVE
Nitrite: NEGATIVE
PROTEIN: 30 mg/dL — AB
SPECIFIC GRAVITY, URINE: 1.029 (ref 1.005–1.030)
pH: 5.5 (ref 5.0–8.0)

## 2015-09-08 LAB — TSH: TSH: 1.13 u[IU]/mL (ref 0.350–4.500)

## 2015-09-08 LAB — CBC
HCT: 31.1 % — ABNORMAL LOW (ref 36.0–46.0)
HEMOGLOBIN: 9 g/dL — AB (ref 12.0–15.0)
MCH: 20 pg — AB (ref 26.0–34.0)
MCHC: 28.9 g/dL — ABNORMAL LOW (ref 30.0–36.0)
MCV: 69.1 fL — ABNORMAL LOW (ref 78.0–100.0)
PLATELETS: 236 10*3/uL (ref 150–400)
RBC: 4.5 MIL/uL (ref 3.87–5.11)
RDW: 20.8 % — ABNORMAL HIGH (ref 11.5–15.5)
WBC: 10.8 10*3/uL — AB (ref 4.0–10.5)

## 2015-09-08 LAB — URINE MICROSCOPIC-ADD ON

## 2015-09-08 LAB — LIPASE, BLOOD: LIPASE: 27 U/L (ref 11–51)

## 2015-09-08 LAB — I-STAT BETA HCG BLOOD, ED (MC, WL, AP ONLY)

## 2015-09-08 LAB — I-STAT CG4 LACTIC ACID, ED: LACTIC ACID, VENOUS: 1.61 mmol/L (ref 0.5–2.0)

## 2015-09-08 MED ORDER — SODIUM CHLORIDE 0.9 % IV BOLUS (SEPSIS)
1000.0000 mL | Freq: Once | INTRAVENOUS | Status: AC
  Administered 2015-09-08: 1000 mL via INTRAVENOUS

## 2015-09-08 MED ORDER — POTASSIUM CHLORIDE CRYS ER 20 MEQ PO TBCR
40.0000 meq | EXTENDED_RELEASE_TABLET | Freq: Once | ORAL | Status: AC
  Administered 2015-09-08: 40 meq via ORAL
  Filled 2015-09-08: qty 2

## 2015-09-08 MED ORDER — DIPHENHYDRAMINE HCL 50 MG/ML IJ SOLN
12.5000 mg | Freq: Once | INTRAMUSCULAR | Status: AC
  Administered 2015-09-08: 12.5 mg via INTRAVENOUS
  Filled 2015-09-08: qty 1

## 2015-09-08 MED ORDER — ONDANSETRON HCL 4 MG/2ML IJ SOLN: 4.0000 mg | Freq: Once | INTRAMUSCULAR | Status: DC | PRN

## 2015-09-08 MED ORDER — SODIUM CHLORIDE 0.9 % IV BOLUS (SEPSIS): 1000.0000 mL | Freq: Once | INTRAVENOUS | Status: DC

## 2015-09-08 MED ORDER — METOCLOPRAMIDE HCL 5 MG/ML IJ SOLN
10.0000 mg | Freq: Once | INTRAMUSCULAR | Status: AC
  Administered 2015-09-08: 10 mg via INTRAVENOUS
  Filled 2015-09-08: qty 2

## 2015-09-08 NOTE — ED Provider Notes (Signed)
CSN: [id number]     Arrival date & time 09/08/15  [id number] History   First MD Initiated Contact with Patient 09/08/15 [phone]     Chief Complaint  Patient presents with  . Diarrhea  . Headache   (Consider location/radiation/quality/duration/timing/severity/associated sxs/prior Treatment) HPI 40 y.o. female with a hx of Anemia, presents to the Emergency Department today complaining of generalized abdominal pain since last night as well as headache. States pain felt like a cramping sensation that spans from one side of her abdomen to the other. No pain currently. No headache currently. States that she tried eating something last night that caused her pain to increase. No RUQ pain at that time. Mostly lower abdomen. Noted fever 101.2 at home. Also noted headache. No neck stiffness/rigidity. Has had diarrhea yesterday as well. No N/V. No recent ABX use. Pt able to tolerate PO. Of note, pt states that her mom has had similar symptoms with Viral gastroenteritis. No other symptoms noted.    Past Medical History  Diagnosis Date  . Anemia    Past Surgical History  Procedure Laterality Date  . Tubal ligation    . Cesarean section     Family History  Problem Relation Age of Onset  . Diabetes Father   . Hypertension Father   . Heart disease Father   . Hypertension Mother    Social History  Substance Use Topics  . Smoking status: Never Smoker   . Smokeless tobacco: Never Used  . Alcohol Use: 10.2 oz/week    3 Glasses of wine, 14 Shots of liquor per week     Comment: 2 glasses vodka per day   OB History    Gravida Para Term Preterm AB TAB SAB Ectopic Multiple Living   4 3 3  1  1   3      Review of Systems ROS reviewed and all are negative for acute change except as noted in the HPI.  Allergies  Naproxen  Home Medications   Prior to Admission medications   Medication Sig Start Date End Date Taking? Authorizing Provider  ibuprofen (ADVIL,MOTRIN) 200 MG tablet Take 400 mg by mouth every 6  (six) hours as needed for headache or moderate pain.    Historical Provider, MD  lidocaine (XYLOCAINE) 2 % solution Use as directed 20 mLs in the mouth or throat as needed for mouth pain. 12/03/13   Heather Laisure, PA-C   BP 134/99 mmHg  Pulse 147  Temp(Src) 99.7 F (37.6 C) (Oral)  Resp 16  Ht 5\' 3"  (1.6 m)  Wt 79.379 kg  BMI 31.01 kg/m2  SpO2 100%  LMP 08/08/2015   Physical Exam  Constitutional: She is oriented to person, place, and time. She appears well-developed and well-nourished.  Pt sitting comfortably. Non tremulous on exam. In NAD  HENT:  Head: Normocephalic and atraumatic.  Eyes: EOM are normal. Pupils are equal, round, and reactive to light.  Neck: Normal range of motion and full passive range of motion without pain. Neck supple. No spinous process tenderness and no muscular tenderness present. No rigidity. No tracheal deviation, no edema, no erythema and normal range of motion present. No Brudzinski's sign and no Kernig's sign noted.  Cardiovascular: Regular rhythm, normal heart sounds and intact distal pulses.  Tachycardia present.   No murmur heard. Pulmonary/Chest: Effort normal. No respiratory distress. She has no wheezes. She has no rales. She exhibits no tenderness.  Abdominal: Soft. Normal appearance and bowel sounds are normal. There is no tenderness. There is  no rigidity, no rebound, no guarding, no tenderness at McBurney's point and negative Murphy's sign.  Abdomen soft. Non peritoneal.   Musculoskeletal: Normal range of motion.  Neurological: She is alert and oriented to person, place, and time.  Skin: Skin is warm and dry.  Psychiatric: She has a normal mood and affect. Her behavior is normal. Thought content normal.  Nursing note and vitals reviewed.  ED Course  Procedures (including critical care time) Labs Review Labs Reviewed  COMPREHENSIVE METABOLIC PANEL - Abnormal; Notable for the following:    Sodium 134 (*)    Potassium 3.2 (*)    Glucose, Bld  114 (*)    Calcium 8.8 (*)    Albumin 3.4 (*)    ALT 13 (*)    All other components within normal limits  CBC - Abnormal; Notable for the following:    WBC 10.8 (*)    Hemoglobin 9.0 (*)    HCT 31.1 (*)    MCV 69.1 (*)    MCH 20.0 (*)    MCHC 28.9 (*)    RDW 20.8 (*)    All other components within normal limits  URINALYSIS, ROUTINE W REFLEX MICROSCOPIC (NOT AT [facility]) - Abnormal; Notable for the following:    Color, Urine AMBER (*)    APPearance TURBID (*)    Hgb urine dipstick LARGE (*)    Ketones, ur 15 (*)    Protein, ur 30 (*)    All other components within normal limits  URINE MICROSCOPIC-ADD ON - Abnormal; Notable for the following:    Squamous Epithelial / LPF 0-5 (*)    Bacteria, UA RARE (*)    All other components within normal limits  LIPASE, BLOOD  I-STAT BETA HCG BLOOD, ED ([facility], [facility], [facility] ONLY)  I-STAT CG4 LACTIC ACID, ED   Imaging Review No results found. I have personally reviewed and evaluated these images and lab results as part of my medical decision-making.   EKG Interpretation   Date/Time:  Friday September 08 2015 11:36:28 EDT Ventricular Rate:  122 PR Interval:  148 QRS Duration: 79 QT Interval:  316 QTC Calculation: 450 R Axis:   63 Text Interpretation:  Sinus tachycardia normal intervals and normal axis   No acute changes Confirmed by Kathrynn Humble, MD, Thelma Comp [phone]) on 09/08/2015  1:52:45 PM      MDM  I have reviewed and evaluated the relevant laboratory values I have reviewed and evaluated the relevant imaging studies. I have reviewed the relevant previous healthcare records.  I obtained HPI from historian. Patient discussed with supervising physician  ED Course:  Assessment: Pt is a 40yF with hx Anemia who presents with epigastric abdominal pain since yesterday and headache. Noted diarrhea x7 with no recent abx use. On exam, pt in NAD. Nontoxic/nonseptic appearing. VS show tachycardia. No hypoxia. Temp 99.7. Lungs CTA. Heart RRR. Abdomen nontender soft.  NOn surgical abdomen. No nuchal rigidity. Full ROM of neck without pain. Notes no pain currently. No CP/ABD pain. No SOB. iStat lactate 1.61. CBC with WBC 10.8. EKG unremarkable. Given 2L Fluids in ED. HR responded well to fluids. Plan is to DC home with follow up to PCP. Likely viral gastroenteritis. At time of discharge, Patient is in no acute distress. Vital Signs are stable. Patient is able to ambulate. Patient able to tolerate PO.    Disposition/Plan:  DC Home Additional Verbal discharge instructions given and discussed with patient.  Pt Instructed to f/u with PCP in the next week for evaluation and  treatment of symptoms. Return precautions given Pt acknowledges and agrees with plan  Supervising Physician Varney Biles, MD   Final diagnoses:  Viral gastroenteritis     Shary Decamp, PA-C 09/08/15 [place], MD 09/08/15 1624

## 2015-09-08 NOTE — ED Notes (Addendum)
Pt states abdominal cramping, diarrhea x 7, leg cramping, fever of 101.2 and headache since yesterday.  HR 147.  Pt drinks 2 glasses vodka per day and has not had any since falling ill.

## 2015-09-08 NOTE — Discharge Instructions (Signed)
Please read and follow all provided instructions.  Your diagnoses today include:  1. Viral gastroenteritis    Tests performed today include:  Vital signs. See below for your results today.   Medications prescribed:   Take as prescribed   Home care instructions:  Follow any educational materials contained in this packet.  Follow-up instructions: Please follow-up with your primary care provider for further evaluation of symptoms and treatment   Return instructions:   Please return to the Emergency Department if you do not get better, if you get worse, or new symptoms OR  - Fever (temperature greater than 101.49F)  - Bleeding that does not stop with holding pressure to the area    -Severe pain (please note that you may be more sore the day after your accident)  - Chest Pain  - Difficulty breathing  - Severe nausea or vomiting  - Inability to tolerate food and liquids  - Passing out  - Skin becoming red around your wounds  - Change in mental status (confusion or lethargy)  - New numbness or weakness     Please return if you have any other emergent concerns.  Additional Information:  Your vital signs today were: BP 149/97 mmHg   Pulse 121   Temp(Src) 98.2 F (36.8 C) (Oral)   Resp 17   Ht 5\' 3"  (1.6 m)   Wt 79.379 kg   BMI 31.01 kg/m2   SpO2 100%   LMP 08/08/2015 If your blood pressure (BP) was elevated above 135/85 this visit, please have this repeated by your doctor within one month. ---------------

## 2015-09-11 ENCOUNTER — Telehealth: Payer: Self-pay | Admitting: *Deleted

## 2015-09-11 ENCOUNTER — Ambulatory Visit: Payer: Self-pay | Admitting: Family Medicine

## 2015-09-18 ENCOUNTER — Ambulatory Visit (HOSPITAL_COMMUNITY)
Admission: EM | Admit: 2015-09-18 | Discharge: 2015-09-18 | Disposition: A | Discharge status: Home / Self Care | Payer: Self-pay | Attending: Family Medicine | Admitting: Family Medicine

## 2015-09-18 ENCOUNTER — Encounter (HOSPITAL_COMMUNITY): Payer: Self-pay | Admitting: Emergency Medicine

## 2015-09-18 DIAGNOSIS — M109 Gout, unspecified: Secondary | ICD-10-CM

## 2015-09-18 DIAGNOSIS — M10072 Idiopathic gout, left ankle and foot: Secondary | ICD-10-CM

## 2015-09-18 MED ORDER — COLCHICINE 0.6 MG PO TABS: 0.6000 mg | ORAL_TABLET | Freq: Two times a day (BID) | ORAL | Status: DC

## 2015-09-18 MED ORDER — PREDNISONE 10 MG PO TABS: ORAL_TABLET | ORAL | Status: DC

## 2015-09-18 NOTE — ED Provider Notes (Signed)
CSN: [id number]     Arrival date & time 09/18/15  1307 History   First MD Initiated Contact with Patient 09/18/15 1341     Chief Complaint  Patient presents with  . Ankle Pain   (Consider location/radiation/quality/duration/timing/severity/associated sxs/prior Treatment) HPI History obtained from patient:  Pt presents with the cc of:  Left ankle pain Duration of symptoms: 2 days Treatment prior to arrival: Ibuprofen Context: Patient states that she ate a large amount of seafood this past weekend no known injury but suddenly started having pain in her left ankle. Other symptoms include: Swelling, redness Pain score: 4 FAMILY HISTORY: Gout-father    Past Medical History  Diagnosis Date  . Anemia    Past Surgical History  Procedure Laterality Date  . Tubal ligation    . Cesarean section     Family History  Problem Relation Age of Onset  . Diabetes Father   . Hypertension Father   . Heart disease Father   . Gout Father   . Hypertension Mother    Social History  Substance Use Topics  . Smoking status: Never Smoker   . Smokeless tobacco: Never Used  . Alcohol Use: 10.2 oz/week    3 Glasses of wine, 14 Shots of liquor per week     Comment: 2 glasses vodka per day   OB History    Gravida Para Term Preterm AB TAB SAB Ectopic Multiple Living   4 3 3  1  1   3      Review of Systems  Denies: HEADACHE, NAUSEA, ABDOMINAL PAIN, CHEST PAIN, CONGESTION, DYSURIA, SHORTNESS OF BREATH  Allergies  Naproxen  Home Medications   Prior to Admission medications   Medication Sig Start Date End Date Taking? Authorizing Provider  colchicine 0.6 MG tablet Take 1 tablet (0.6 mg total) by mouth 2 (two) times daily. 09/18/15 09/23/15  Konrad Felix, PA  ibuprofen (ADVIL,MOTRIN) 200 MG tablet Take 400 mg by mouth every 6 (six) hours as needed for headache or moderate pain.    Historical Provider, MD  predniSONE (DELTASONE) 10 MG tablet Sig: 4 tables once a day for 3 days, 3 tablets once a  day X3 days, 2 tablets a day for 3 days, 1 tablet a day for 3 days. 09/18/15   Konrad Felix, PA   Meds Ordered and Administered this Visit  Medications - No data to display  BP 137/91 mmHg  Pulse 108  Temp(Src) 98 F (36.7 C) (Oral)  Resp 20  SpO2 100%  LMP 09/11/2015 No data found.   Physical Exam NURSES NOTES AND VITAL SIGNS REVIEWED. CONSTITUTIONAL: Well developed, well nourished, no acute distress HEENT: normocephalic, atraumatic EYES: Conjunctiva normal NECK:normal ROM, supple, no adenopathy PULMONARY:No respiratory distress, normal effort ABDOMINAL: Soft, ND, NT BS+, No CVAT MUSCULOSKELETAL: Normal ROM of all extremities, Medial malleolus  Tender to lightest touch, warm with mild redness.  SKIN: warm and dry without rash PSYCHIATRIC: Mood and affect, behavior are normal  ED Course  Procedures (including critical care time)  Labs Review Labs Reviewed - No data to display  Imaging Review No results found.   Visual Acuity Review  Right Eye Distance:   Left Eye Distance:   Bilateral Distance:    Right Eye Near:   Left Eye Near:    Bilateral Near:      [id number], colchicine   MDM   1. Acute gout of left ankle, unspecified cause     Patient is reassured that there are no issues  that require transfer to higher level of care at this time or additional tests. Patient is advised to continue home symptomatic treatment. Patient is advised that if there are new or worsening symptoms to attend the emergency department, contact primary care provider, or return to UC. Instructions of care provided discharged home in stable condition.    THIS NOTE WAS GENERATED USING A VOICE RECOGNITION SOFTWARE PROGRAM. ALL REASONABLE EFFORTS  WERE MADE TO PROOFREAD THIS DOCUMENT FOR ACCURACY.  I have verbally reviewed the discharge instructions with the patient. A printed AVS was given to the patient.  All questions were answered prior to discharge.      Konrad Felix, [place] [employer] [place] 09/18/15 [phone]

## 2015-09-18 NOTE — Discharge Instructions (Signed)
Gout °Gout is when your joints become red, sore, and swell (inflamed). This is caused by the buildup of uric acid crystals in the joints. Uric acid is a chemical that is normally in the blood. If the level of uric acid gets too high in the blood, these crystals form in your joints and tissues. Over time, these crystals can form into masses near the joints and tissues. These masses can destroy bone and cause the bone to look misshapen (deformed). °HOME CARE  °· Do not take aspirin for pain. °· Only take medicine as told by your doctor. °· Rest the joint as much as you can. When in bed, keep sheets and blankets off painful areas. °· Keep the sore joints raised (elevated). °· Put warm or cold packs on painful joints. Use of warm or cold packs depends on which works best for you. °· Use crutches if the painful joint is in your leg. °· Drink enough fluids to keep your pee (urine) clear or pale yellow. Limit alcohol, sugary drinks, and drinks with fructose in them. °· Follow your diet instructions. Pay careful attention to how much protein you eat. Include fruits, vegetables, whole grains, and fat-free or low-fat milk products in your daily diet. Talk to your doctor or dietitian about the use of coffee, vitamin C, and cherries. These may help lower uric acid levels. °· Keep a healthy body weight. °GET HELP RIGHT AWAY IF:  °· You have watery poop (diarrhea), throw up (vomit), or have any side effects from medicines. °· You do not feel better in 24 hours, or you are getting worse. °· Your joint becomes suddenly more tender, and you have chills or a fever. °MAKE SURE YOU:  °· Understand these instructions. °· Will watch your condition. °· Will get help right away if you are not doing well or get worse. °  °This information is not intended to replace advice given to you by your health care provider. Make sure you discuss any questions you have with your health care provider. °  °Document Released: 01/02/2008 Document Revised:  04/15/2014 Document Reviewed: 11/06/2011 °Elsevier Interactive Patient Education ©2016 [employer] ° °

## 2015-09-18 NOTE — ED Notes (Signed)
Left ankle pain.  Patient woke with ankle pain on Sunday morning.  No known injury.  The inside of left ankle is swollen and painful.  Patient reports there is pain with and without weight bearing.  Only new shoes are work shoes she has worn for 2 weeks.  Patient is able to move toes, no numbness, left pedal pulse is 2 +.

## 2016-03-27 ENCOUNTER — Other Ambulatory Visit (HOSPITAL_COMMUNITY): Payer: Self-pay | Admitting: *Deleted

## 2016-03-27 ENCOUNTER — Ambulatory Visit: Payer: Self-pay | Admitting: Obstetrics and Gynecology

## 2016-03-27 DIAGNOSIS — N644 Mastodynia: Secondary | ICD-10-CM

## 2016-04-08 DIAGNOSIS — D649 Anemia, unspecified: Secondary | ICD-10-CM

## 2016-04-08 HISTORY — DX: Anemia, unspecified: D64.9

## 2016-04-25 ENCOUNTER — Ambulatory Visit (HOSPITAL_COMMUNITY): Payer: Self-pay

## 2016-04-26 ENCOUNTER — Other Ambulatory Visit: Payer: Self-pay

## 2016-05-28 ENCOUNTER — Ambulatory Visit
Admission: RE | Admit: 2016-05-28 | Discharge: 2016-05-28 | Disposition: A | Discharge status: Home / Self Care | Payer: No Typology Code available for payment source | Source: Ambulatory Visit | Attending: Obstetrics and Gynecology | Admitting: Obstetrics and Gynecology

## 2016-05-28 ENCOUNTER — Ambulatory Visit (HOSPITAL_COMMUNITY)
Admission: RE | Admit: 2016-05-28 | Discharge: 2016-05-28 | Disposition: A | Discharge status: Home / Self Care | Payer: Self-pay | Source: Ambulatory Visit | Attending: Obstetrics and Gynecology | Admitting: Obstetrics and Gynecology

## 2016-05-28 ENCOUNTER — Encounter (HOSPITAL_COMMUNITY): Payer: Self-pay

## 2016-05-28 VITALS — BP 156/100 | Temp 98.5°F | Ht 65.0 in | Wt 187.6 lb

## 2016-05-28 DIAGNOSIS — N644 Mastodynia: Secondary | ICD-10-CM

## 2016-05-28 DIAGNOSIS — Z01419 Encounter for gynecological examination (general) (routine) without abnormal findings: Secondary | ICD-10-CM

## 2016-05-28 NOTE — Progress Notes (Signed)
Complaints of left outer breast pain that radiates to the center of the breast x 3 months. Patient rates the pain at a 4 out of 10.  Pap Smear: Pap smear completed today. Last Pap smear was 08/20/2011 at the [facility] and normal. Per patient has no history of an abnormal Pap smear. Last Pap smear result is in EPIC.  Physical exam: Breasts Breasts symmetrical. No skin abnormalities bilateral breasts. No nipple retraction bilateral breasts. No nipple discharge bilateral breasts. No lymphadenopathy. No lumps palpated bilateral breasts. No complaints of pain or tenderness on exam. Referred patient to the [place] for a diagnostic mammogram. Appointment scheduled for Tuesday, May 28, 2016 at 1350.  Pelvic/Bimanual   Ext Genitalia No lesions, no swelling and no discharge observed on external genitalia.         Vagina Vagina pink and normal texture. No lesions or discharge observed in vagina.          Cervix Cervix is present. Cervix pink and of normal texture. No discharge observed.     Uterus Uterus is present and palpable. Uterus in normal position and normal size.        Adnexae Bilateral ovaries present and palpable. No tenderness on palpation.          Rectovaginal No rectal exam completed today since patient had no rectal complaints. No skin abnormalities observed on exam.    Smoking History: Patient has never smoked.  Patient Navigation: Patient education provided. Access to services provided for patient through [facility] program.

## 2016-05-28 NOTE — Progress Notes (Signed)
Pap smear completed

## 2016-05-28 NOTE — Patient Instructions (Signed)
Explained breast self awareness with Brain Hilts. Let patient know [facility] will cover Pap smears and HPV typing every 5 years unless has a history of abnormal Pap smears. Referred patient to the [place] for a diagnostic mammogram. Appointment scheduled for Tuesday, May 28, 2016 at 1350. Let patient know will follow up with her within the next couple weeks with results of Pap smear by phone. Brain Hilts verbalized understanding.  Tyreck Bell, Arvil Chaco, RN 3:08 PM

## 2016-05-29 LAB — CYTOLOGY - PAP
DIAGNOSIS: NEGATIVE
HPV (WINDOPATH): NOT DETECTED

## 2016-05-31 ENCOUNTER — Encounter (HOSPITAL_COMMUNITY): Payer: Self-pay | Admitting: *Deleted

## 2016-07-12 ENCOUNTER — Encounter (HOSPITAL_COMMUNITY): Payer: Self-pay | Admitting: *Deleted

## 2016-07-12 NOTE — Progress Notes (Signed)
Letter sent with pap smear results.

## 2016-09-09 ENCOUNTER — Ambulatory Visit (HOSPITAL_COMMUNITY)
Admission: EM | Admit: 2016-09-09 | Discharge: 2016-09-09 | Disposition: A | Discharge status: Home / Self Care | Payer: Self-pay

## 2016-09-09 NOTE — ED Notes (Addendum)
Right lower quadrant abdominal pain for a week, no worse, just not any better.  Some diarrhea, poor appetite.  Denies urinary issues.  "nagging pain".   Dr Marcille Blanco discussed options with patient .  Patient left going to emergency department

## 2016-10-15 ENCOUNTER — Ambulatory Visit (HOSPITAL_COMMUNITY)
Admission: EM | Admit: 2016-10-15 | Discharge: 2016-10-15 | Disposition: A | Discharge status: Home / Self Care | Payer: Self-pay | Attending: Family Medicine | Admitting: Family Medicine

## 2016-10-15 ENCOUNTER — Encounter (HOSPITAL_COMMUNITY): Payer: Self-pay | Admitting: *Deleted

## 2016-10-15 DIAGNOSIS — M1040 Other secondary gout, unspecified site: Secondary | ICD-10-CM

## 2016-10-15 DIAGNOSIS — M79675 Pain in left toe(s): Secondary | ICD-10-CM

## 2016-10-15 HISTORY — DX: Gout, unspecified: M10.9

## 2016-10-15 MED ORDER — METHYLPREDNISOLONE 4 MG PO TBPK: ORAL_TABLET | ORAL | 0 refills | Status: DC

## 2016-10-15 MED ORDER — COLCHICINE 0.6 MG PO TABS: 0.6000 mg | ORAL_TABLET | Freq: Two times a day (BID) | ORAL | 0 refills | Status: DC

## 2016-10-15 NOTE — ED Provider Notes (Signed)
CSN: [id number]     Arrival date & time 10/15/16  1623 History   None    Chief Complaint  Patient presents with  . Toe Pain   (Consider location/radiation/quality/duration/timing/severity/associated sxs/prior Treatment) Patient is having a gout flare and c/o left toe pain.   The history is provided by the patient.  Toe Pain  This is a new problem. The problem occurs constantly. The problem has not changed since onset.Nothing aggravates the symptoms. Nothing relieves the symptoms. She has tried nothing for the symptoms.    Past Medical History:  Diagnosis Date  . Anemia   . Gout    Past Surgical History:  Procedure Laterality Date  . CESAREAN SECTION    . TUBAL LIGATION     Family History  Problem Relation Age of Onset  . Diabetes Father   . Hypertension Father   . Heart disease Father   . Gout Father   . Hypertension Mother   . Breast cancer Maternal Aunt   . Breast cancer Cousin    Social History  Substance Use Topics  . Smoking status: Never Smoker  . Smokeless tobacco: Never Used  . Alcohol use Yes     Comment: 3 mixed drinks a week   OB History    Gravida Para Term Preterm AB Living   4 3 3   1 3    SAB TAB Ectopic Multiple Live Births   1             Review of Systems  Constitutional: Negative.   HENT: Negative.   Eyes: Negative.   Respiratory: Negative.   Cardiovascular: Negative.   Gastrointestinal: Negative.   Endocrine: Negative.   Genitourinary: Negative.   Musculoskeletal: Positive for arthralgias.  Allergic/Immunologic: Negative.   Neurological: Negative.   Hematological: Negative.   Psychiatric/Behavioral: Negative.     Allergies  Naproxen  Home Medications   Prior to Admission medications   Medication Sig Start Date End Date Taking? Authorizing Provider  colchicine 0.6 MG tablet Take 1 tablet (0.6 mg total) by mouth 2 (two) times daily. 10/15/16 10/20/16  Lysbeth Penner, FNP  ibuprofen (ADVIL,MOTRIN) 200 MG tablet Take 400 mg by  mouth every 6 (six) hours as needed for headache or moderate pain.    [provider]  methylPREDNISolone (MEDROL DOSEPAK) 4 MG TBPK tablet Take 6-5-4-3-2-1 po qd 10/15/16   Lysbeth Penner, FNP  predniSONE (DELTASONE) 10 MG tablet Sig: 4 tables once a day for 3 days, 3 tablets once a day X3 days, 2 tablets a day for 3 days, 1 tablet a day for 3 days. Patient not taking: Reported on 05/28/2016 09/18/15   Konrad Felix, PA   Meds Ordered and Administered this Visit  Medications - No data to display  BP (!) 147/86 (BP Location: Right Arm)   Pulse (!) 107 Comment: rn notified  Temp 99.4 F (37.4 C) (Oral)   LMP 10/01/2016   SpO2 100%  No data found.   Physical Exam  Constitutional: She appears well-developed and well-nourished.  HENT:  Head: Normocephalic and atraumatic.  Right Ear: External ear normal.  Left Ear: External ear normal.  Mouth/Throat: Oropharynx is clear and moist.  Eyes: Conjunctivae and EOM are normal. Pupils are equal, round, and reactive to light.  Neck: Normal range of motion. Neck supple.  Cardiovascular: Normal rate, regular rhythm and normal heart sounds.   Pulmonary/Chest: Effort normal and breath sounds normal.  Abdominal: Soft. Bowel sounds are normal.  Musculoskeletal: She exhibits  tenderness.  TTP left first toe,  Left first toe erythematous and swollen  Nursing note and vitals reviewed.   Urgent Care Course     Procedures (including critical care time)  Labs Review Labs Reviewed - No data to display  Imaging Review No results found.   Visual Acuity Review  Right Eye Distance:   Left Eye Distance:   Bilateral Distance:    Right Eye Near:   Left Eye Near:    Bilateral Near:         MDM   1. Toe pain, left   2. Other secondary acute gout, unspecified site    Medrol dose pack as directed 4;mg #21 Colchicine 0.6mg  one po bid #10      Lysbeth Penner, FNP 10/15/16 1826

## 2016-10-15 NOTE — ED Triage Notes (Signed)
Flair  Up  Of  Her  Gout  yest    The  Toe  Affected  Is    Left   Big  Toe    Denies   Any  Injury

## 2016-11-19 ENCOUNTER — Encounter (HOSPITAL_COMMUNITY): Payer: Self-pay | Admitting: Emergency Medicine

## 2016-11-19 ENCOUNTER — Ambulatory Visit (HOSPITAL_COMMUNITY)
Admission: EM | Admit: 2016-11-19 | Discharge: 2016-11-19 | Disposition: A | Discharge status: Home / Self Care | Payer: Self-pay | Attending: Family Medicine | Admitting: Family Medicine

## 2016-11-19 DIAGNOSIS — M79671 Pain in right foot: Secondary | ICD-10-CM

## 2016-11-19 DIAGNOSIS — M1000 Idiopathic gout, unspecified site: Secondary | ICD-10-CM

## 2016-11-19 MED ORDER — METHYLPREDNISOLONE 4 MG PO TBPK: ORAL_TABLET | ORAL | 0 refills | Status: DC

## 2016-11-19 MED ORDER — COLCHICINE 0.6 MG PO TABS: 0.6000 mg | ORAL_TABLET | Freq: Two times a day (BID) | ORAL | 0 refills | Status: DC

## 2016-11-19 NOTE — ED Provider Notes (Signed)
  [facility]   [id number] 11/19/16 Arrival Time: 8546  ASSESSMENT & PLAN:  1. Foot pain, right   2. Acute idiopathic gout, unspecified site     Meds ordered this encounter  Medications  . colchicine 0.6 MG tablet    Sig: Take 1 tablet (0.6 mg total) by mouth 2 (two) times daily.    Dispense:  20 tablet    Refill:  0    Order Specific Question:   Supervising Provider    Answer:   Sherlene Shams [[id number]]  . methylPREDNISolone (MEDROL DOSEPAK) 4 MG TBPK tablet    Sig: Take 6-5-4-3-2-1 po qd    Dispense:  21 tablet    Refill:  0    Order Specific Question:   Supervising Provider    Answer:   Sherlene Shams [[id number]]    Reviewed expectations re: course of current medical issues. Questions answered. Outlined signs and symptoms indicating need for more acute intervention. Patient verbalized understanding. After Visit Summary given.   SUBJECTIVE:  Rebecca Joseph is a 41 y.o. female who presents with complaint of right first toe pain and recurrent gout flares.  She does not have PCP.  She has been getting recurrent gout.  ROS: As per HPI.   OBJECTIVE:  Vitals:   11/19/16 1348  BP: (!) 159/69  Pulse: 89  Resp: 16  Temp: 98.4 F (36.9 C)  TempSrc: Oral  SpO2: 100%  Weight: 182 lb (82.6 kg)  Height: 5\' 5"  (1.651 m)     General appearance: alert; no distress HEENT: normocephalic; atraumatic; conjunctivae normal; TMs normal; nasal mucosa normal; oral mucosa normal Neck: supple Lungs: clear to auscultation bilaterally Heart: regular rate and rhythm Abdomen: soft, non-tender; bowel sounds normal; no masses or organomegaly; no guarding or rebound tenderness Back: no CVA tenderness Extremities: no cyanosis or edema; symmetrical with no gross deformities Skin: warm and dry Neurologic: normal symmetric reflexes; normal gait Psychological:  alert and cooperative; normal mood and affect    Labs Reviewed - No data to display  No results  found.  Allergies  Allergen Reactions  . Naproxen Shortness Of Breath    PMHx, SurgHx, SocialHx, Medications, and Allergies were reviewed in the Visit Navigator and updated as appropriate.      Lysbeth Penner, [place] 11/19/16 [phone]

## 2016-11-19 NOTE — ED Triage Notes (Signed)
PT reports pain in right toe. PT has history of gout.

## 2016-11-27 NOTE — Progress Notes (Signed)
Patient ID: Rebecca Joseph, female   DOB: 07-Dec-1975, 41 y.o.   MRN: [id number]    Rebecca Joseph, is a 41 y.o. female  [id number]  [id number]  DOB - March 11, 1976  Subjective:  Chief Complaint and HPI: Rebecca Joseph is a 41 y.o. female here today to establish care and for a follow up visit After being seen int he ED 11/19/2016 for R foot pain that was presumed to be recurrent gout and treated with colchicine and prednisone.  Gout is much improved. she would like a RF of colchicine to use if needed again to avoid ED visit.  Usu <3 episodes annually.  BP was elevated in ED.  usu runs "high normal."  BP issues do run in her family.  She denies HA, dizziness, CP, SOB.   PMH:  Anemia with heavy periods.  She hasn't taken iron in a long time secondary to nausea as a SE from iron tablets  ED/Hospital notes reviewed.    FH:  htn-dad and mom;  Dad no longer living, dad had gout and diabetes as well  ROS:   Constitutional:  No f/c, No night sweats, No unexplained weight loss. EENT:  No vision changes, No blurry vision, No hearing changes. No mouth, throat, or ear problems.  Respiratory: No cough, No SOB Cardiac: No CP, no palpitations GI:  No abd pain, No N/V/D. GU: No Urinary s/sx Musculoskeletal: No joint pain Neuro: No headache, no dizziness, no motor weakness.  Skin: No rash Endocrine:  No polydipsia. No polyuria.  Psych: Denies SI/HI  No problems updated.  ALLERGIES: Allergies  Allergen Reactions  . Naproxen Shortness Of Breath    PAST MEDICAL HISTORY: Past Medical History:  Diagnosis Date  . Anemia   . Gout     MEDICATIONS AT HOME: Prior to Admission medications   Medication Sig Start Date End Date Taking? Authorizing Provider  colchicine 0.6 MG tablet Take 1 tablet (0.6 mg total) by mouth 2 (two) times daily. prn 11/28/16 12/03/16  Argentina Donovan, PA-C  ferrous sulfate 325 (65 FE) MG tablet Take 1 tablet (325 mg total) by mouth daily with  breakfast. 11/28/16   Argentina Donovan, PA-C  ibuprofen (ADVIL,MOTRIN) 200 MG tablet Take 400 mg by mouth every 6 (six) hours as needed for headache or moderate pain.    [provider]  methylPREDNISolone (MEDROL DOSEPAK) 4 MG TBPK tablet Take 6-5-4-3-2-1 po qd 11/19/16   Lysbeth Penner, FNP  ondansetron [facility]) 8 MG tablet 1/2-1 every 8 hours as needed for nausea 11/28/16   Freeman Caldron M, PA-C  predniSONE (DELTASONE) 10 MG tablet Sig: 4 tables once a day for 3 days, 3 tablets once a day X3 days, 2 tablets a day for 3 days, 1 tablet a day for 3 days. Patient not taking: Reported on 05/28/2016 09/18/15   Linde Gillis C, PA     Objective:  EXAM:   Vitals:   11/28/16 1046  BP: 133/88  Pulse: (!) 108  Resp: 18  Temp: 98 F (36.7 C)  TempSrc: Oral  SpO2: 100%  Weight: 186 lb (84.4 kg)  Height: 5\' 5"  (1.651 m)    General appearance : A&OX3. NAD. Non-toxic-appearing HEENT: Atraumatic and Normocephalic.  PERRLA. EOM intact.  TM clear B. Neck: supple, no JVD. No cervical lymphadenopathy. No thyromegaly Chest/Lungs:  Breathing-non-labored, Good air entry bilaterally, breath sounds normal without rales, rhonchi, or wheezing  CVS: S1 S2 regular, no murmurs, gallops, rubs  Extremities: Bilateral Lower Ext shows no  edema, both legs are warm to touch with = pulse throughout Neurology:  CN II-XII grossly intact, Non focal.   Psych:  TP linear. J/I WNL. Normal speech. Appropriate eye contact and affect.  Skin:  No Rash  Data Review No results found for: HGBA1C   Assessment & Plan   1. Elevated BP without diagnosis of hypertension Check BP OOO and record and bring to next visit - Comprehensive metabolic panel  2. Iron deficiency anemia due to chronic blood loss - ferrous sulfate 325 (65 FE) MG tablet; Take 1 tablet (325 mg total) by mouth daily with breakfast.  Dispense: 100 tablet; Refill: 3 - ondansetron (ZOFRAN) 8 MG tablet; 1/2-1 every 8 hours as needed for nausea   Dispense: 20 tablet; Refill: 0 - CBC with Differential/Platelet  3. Gout of foot, unspecified cause, unspecified chronicity, unspecified laterality For prn use-use sparingly - colchicine 0.6 MG tablet; Take 1 tablet (0.6 mg total) by mouth 2 (two) times daily. prn  Dispense: 20 tablet; Refill: 0  Patient have been counseled extensively about nutrition and exercise  Return in about 2 months (around 01/28/2017) for assign PCP; f/up anemia and gout.  The patient was given clear instructions to go to ER or return to medical center if symptoms don't improve, worsen or new problems develop. The patient verbalized understanding. The patient was told to call to get lab results if they haven't heard anything in the next week.     Freeman Caldron, PA-C [facility] [place], [place]   11/28/2016, 10:51 AM

## 2016-11-28 ENCOUNTER — Ambulatory Visit: Payer: Self-pay | Attending: Internal Medicine | Admitting: Physician Assistant

## 2016-11-28 ENCOUNTER — Encounter: Payer: Self-pay | Admitting: Physician Assistant

## 2016-11-28 VITALS — BP 133/88 | HR 108 | Temp 98.0°F | Resp 18 | Ht 65.0 in | Wt 186.0 lb

## 2016-11-28 DIAGNOSIS — Z888 Allergy status to other drugs, medicaments and biological substances status: Secondary | ICD-10-CM | POA: Insufficient documentation

## 2016-11-28 DIAGNOSIS — Z79899 Other long term (current) drug therapy: Secondary | ICD-10-CM | POA: Insufficient documentation

## 2016-11-28 DIAGNOSIS — M79671 Pain in right foot: Secondary | ICD-10-CM | POA: Insufficient documentation

## 2016-11-28 DIAGNOSIS — D500 Iron deficiency anemia secondary to blood loss (chronic): Secondary | ICD-10-CM

## 2016-11-28 DIAGNOSIS — R030 Elevated blood-pressure reading, without diagnosis of hypertension: Secondary | ICD-10-CM

## 2016-11-28 DIAGNOSIS — M109 Gout, unspecified: Secondary | ICD-10-CM

## 2016-11-28 MED ORDER — COLCHICINE 0.6 MG PO TABS: 0.6000 mg | ORAL_TABLET | Freq: Two times a day (BID) | ORAL | 0 refills | Status: DC

## 2016-11-28 MED ORDER — FERROUS SULFATE 325 (65 FE) MG PO TABS: 325.0000 mg | ORAL_TABLET | Freq: Every day | ORAL | 3 refills | Status: DC

## 2016-11-28 MED ORDER — ONDANSETRON HCL 8 MG PO TABS
ORAL_TABLET | ORAL | 0 refills | Status: DC
Start: 2016-11-28 — End: 2017-07-14

## 2016-11-28 MED FILL — COLCHICINE 0.6 MG TABLET: 0.6 | 10 days supply | Qty: 20 | Fill #0

## 2016-11-28 MED FILL — ONDANSETRON HCL 8 MG TABLET: 8 | 6 days supply | Qty: 20 | Fill #0

## 2016-11-28 MED FILL — FERROUS SULFATE 325 MG TAB: 325 (65 FE) | 30 days supply | Qty: 30 | Fill #0

## 2016-11-28 NOTE — Patient Instructions (Addendum)
Check BP out of office 2-3 times weekly and record  Gout Gout is painful swelling that can occur in some of your joints. Gout is a type of arthritis. This condition is caused by having too much uric acid in your body. Uric acid is a chemical that forms when your body breaks down substances called purines. Purines are important for building body proteins. When your body has too much uric acid, sharp crystals can form and build up inside your joints. This causes pain and swelling. Gout attacks can happen quickly and be very painful (acute gout). Over time, the attacks can affect more joints and become more frequent (chronic gout). Gout can also cause uric acid to build up under your skin and inside your kidneys. What are the causes? This condition is caused by too much uric acid in your blood. This can occur because:  Your kidneys do not remove enough uric acid from your blood. This is the most common cause.  Your body makes too much uric acid. This can occur with some cancers and cancer treatments. It can also occur if your body is breaking down too many red blood cells (hemolytic anemia).  You eat too many foods that are high in purines. These foods include organ meats and some seafood. Alcohol, especially beer, is also high in purines.  A gout attack may be triggered by trauma or stress. What increases the risk? This condition is more likely to develop in people who:  Have a family history of gout.  Are female and middle-aged.  Are female and have gone through menopause.  Are obese.  Frequently drink alcohol, especially beer.  Are dehydrated.  Lose weight too quickly.  Have an organ transplant.  Have lead poisoning.  Take certain medicines, including aspirin, cyclosporine, diuretics, levodopa, and niacin.  Have kidney disease or psoriasis.  What are the signs or symptoms? An attack of acute gout happens quickly. It usually occurs in just one joint. The most common place is the  big toe. Attacks often start at night. Other joints that may be affected include joints of the feet, ankle, knee, fingers, wrist, or elbow. Symptoms may include:  Severe pain.  Warmth.  Swelling.  Stiffness.  Tenderness. The affected joint may be very painful to touch.  Shiny, red, or purple skin.  Chills and fever.  Chronic gout may cause symptoms more frequently. More joints may be involved. You may also have white or yellow lumps (tophi) on your hands or feet or in other areas near your joints. How is this diagnosed? This condition is diagnosed based on your symptoms, medical history, and physical exam. You may have tests, such as:  Blood tests to measure uric acid levels.  Removal of joint fluid with a needle (aspiration) to look for uric acid crystals.  X-rays to look for joint damage.  How is this treated? Treatment for this condition has two phases: treating an acute attack and preventing future attacks. Acute gout treatment may include medicines to reduce pain and swelling, including:  NSAIDs.  Steroids. These are strong anti-inflammatory medicines that can be taken by mouth (orally) or injected into a joint.  Colchicine. This medicine relieves pain and swelling when it is taken soon after an attack. It can be given orally or through an IV tube.  Preventive treatment may include:  Daily use of smaller doses of NSAIDs or colchicine.  Use of a medicine that reduces uric acid levels in your blood.  Changes to your diet.  You may need to see a specialist about healthy eating (dietitian).  Follow these instructions at home: During a Gout Attack  If directed, apply ice to the affected area: ? Put ice in a plastic bag. ? Place a towel between your skin and the bag. ? Leave the ice on for 20 minutes, 2-3 times a day.  Rest the joint as much as possible. If the affected joint is in your leg, you may be given crutches to use.  Raise (elevate) the affected joint above  the level of your heart as often as possible.  Drink enough fluids to keep your urine clear or pale yellow.  Take over-the-counter and prescription medicines only as told by your health care provider.  Do not drive or operate heavy machinery while taking prescription pain medicine.  Follow instructions from your health care provider about eating or drinking restrictions.  Return to your normal activities as told by your health care provider. Ask your health care provider what activities are safe for you. Avoiding Future Gout Attacks  Follow a low-purine diet as told by your dietitian or health care provider. Avoid foods and drinks that are high in purines, including liver, kidney, anchovies, asparagus, herring, mushrooms, mussels, and beer.  Limit alcohol intake to no more than 1 drink a day for nonpregnant women and 2 drinks a day for men. One drink equals 12 oz of beer, 5 oz of wine, or 1 oz of hard liquor.  Maintain a healthy weight or lose weight if you are overweight. If you want to lose weight, talk with your health care provider. It is important that you do not lose weight too quickly.  Start or maintain an exercise program as told by your health care provider.  Drink enough fluids to keep your urine clear or pale yellow.  Take over-the-counter and prescription medicines only as told by your health care provider.  Keep all follow-up visits as told by your health care provider. This is important. Contact a health care provider if:  You have another gout attack.  You continue to have symptoms of a gout attack after10 days of treatment.  You have side effects from your medicines.  You have chills or a fever.  You have burning pain when you urinate.  You have pain in your lower back or belly. Get help right away if:  You have severe or uncontrolled pain.  You cannot urinate. This information is not intended to replace advice given to you by your health care provider.  Make sure you discuss any questions you have with your health care provider. Document Released: 03/22/2000 Document Revised: 08/31/2015 Document Reviewed: 01/05/2015 Elsevier Interactive Patient Education  2017 [employer].

## 2016-11-29 ENCOUNTER — Telehealth: Payer: Self-pay | Admitting: *Deleted

## 2016-11-29 LAB — COMPREHENSIVE METABOLIC PANEL
A/G RATIO: 1.4 (ref 1.2–2.2)
ALK PHOS: 64 IU/L (ref 39–117)
ALT: 17 IU/L (ref 0–32)
AST: 14 IU/L (ref 0–40)
Albumin: 4.1 g/dL (ref 3.5–5.5)
BILIRUBIN TOTAL: 0.5 mg/dL (ref 0.0–1.2)
BUN/Creatinine Ratio: 14 (ref 9–23)
BUN: 11 mg/dL (ref 6–24)
CHLORIDE: 101 mmol/L (ref 96–106)
CO2: 22 mmol/L (ref 20–29)
Calcium: 9.8 mg/dL (ref 8.7–10.2)
Creatinine, Ser: 0.77 mg/dL (ref 0.57–1.00)
GFR calc non Af Amer: 96 mL/min/{1.73_m2} (ref >59)
GFR, EST AFRICAN AMERICAN: 111 mL/min/{1.73_m2} (ref >59)
GLUCOSE: 94 mg/dL (ref 65–99)
Globulin, Total: 3 g/dL (ref 1.5–4.5)
POTASSIUM: 4.5 mmol/L (ref 3.5–5.2)
SODIUM: 139 mmol/L (ref 134–144)
Total Protein: 7.1 g/dL (ref 6.0–8.5)

## 2016-11-29 LAB — CBC WITH DIFFERENTIAL/PLATELET
BASOS: 1 %
Basophils Absolute: 0.1 10*3/uL (ref 0.0–0.2)
EOS (ABSOLUTE): 0.3 10*3/uL (ref 0.0–0.4)
EOS: 3 %
HEMATOCRIT: 27.9 % — AB (ref 34.0–46.6)
Hemoglobin: 7.6 g/dL — ABNORMAL LOW (ref 11.1–15.9)
IMMATURE GRANS (ABS): 0 10*3/uL (ref 0.0–0.1)
IMMATURE GRANULOCYTES: 0 %
LYMPHS: 21 %
Lymphocytes Absolute: 2 10*3/uL (ref 0.7–3.1)
MCH: 17.5 pg — ABNORMAL LOW (ref 26.6–33.0)
MCHC: 27.2 g/dL — ABNORMAL LOW (ref 31.5–35.7)
MCV: 64 fL — AB (ref 79–97)
Monocytes Absolute: 0.3 10*3/uL (ref 0.1–0.9)
Monocytes: 3 %
NEUTROS PCT: 72 %
Neutrophils Absolute: 6.8 10*3/uL (ref 1.4–7.0)
PLATELETS: 513 10*3/uL — AB (ref 150–379)
RBC: 4.35 x10E6/uL (ref 3.77–5.28)
RDW: 20.8 % — AB (ref 12.3–15.4)
WBC: 9.4 10*3/uL (ref 3.4–10.8)

## 2016-11-29 NOTE — Telephone Encounter (Signed)
Medical Assistant left message on patient's home and cell voicemail. Voicemail states to give a call back to [place] with [facility] at [phone].  !!!Please inform patient of blood count being VERY low. Patient is instructed to take an iron supplement 3 times a day for 1 week. Then begin 2 times a da for 2 weeks. And then 1 tablet daily. Supplement was sent to [facility]. It is vital for patient to take as prescribed OR she may have to receive a blood transfusion. Please increase water intake. All other labs were normal!!!

## 2016-11-29 NOTE — Telephone Encounter (Signed)
-----   Message from Argentina Donovan, [place] sent at 11/29/2016 10:41 AM EDT ----- Your Hemoglobin is VERY low.  I want you to take iron tablets 3 times daily with food for 1 week, then twice daily for 2 weeks, then 1 daily.  This is very important or you may need a blood transfusion soon.  Increase water intake.  Your other labs look good. Follow-up as planned. Thanks, Freeman Caldron, PA-C

## 2016-11-30 ENCOUNTER — Emergency Department (HOSPITAL_COMMUNITY)
Admission: EM | Admit: 2016-11-30 | Discharge: 2016-12-01 | Disposition: A | Discharge status: Home / Self Care | Payer: Self-pay | Attending: Emergency Medicine | Admitting: Emergency Medicine

## 2016-11-30 ENCOUNTER — Encounter (HOSPITAL_COMMUNITY): Payer: Self-pay

## 2016-11-30 DIAGNOSIS — K0889 Other specified disorders of teeth and supporting structures: Secondary | ICD-10-CM | POA: Insufficient documentation

## 2016-11-30 MED ORDER — OXYCODONE-ACETAMINOPHEN 5-325 MG PO TABS
ORAL_TABLET | ORAL | Status: AC
Start: 2016-11-30 — End: 2016-12-01
  Administered 2016-12-01: 1 via ORAL
  Filled 2016-11-30: qty 1

## 2016-11-30 MED ORDER — OXYCODONE-ACETAMINOPHEN 5-325 MG PO TABS
1.0000 | ORAL_TABLET | ORAL | Status: AC | PRN
  Administered 2016-11-30 – 2016-12-01 (×2): 1 via ORAL
  Filled 2016-11-30: qty 1

## 2016-11-30 NOTE — ED Triage Notes (Signed)
Pt. Reports dental pain to right upper molar. Pt reports sharp pain that started today. Pt. Reports not having a fever or signs of infection.

## 2016-12-01 MED ORDER — PENICILLIN V POTASSIUM 500 MG PO TABS: 500.0000 mg | ORAL_TABLET | Freq: Four times a day (QID) | ORAL | 0 refills | Status: AC

## 2016-12-01 MED ORDER — IBUPROFEN 800 MG PO TABS: 800.0000 mg | ORAL_TABLET | Freq: Three times a day (TID) | ORAL | 0 refills | Status: DC

## 2016-12-01 NOTE — ED Provider Notes (Signed)
[facility] DEPT Provider Note   CSN: [id number] Arrival date & time: 11/30/16  2302     History   Chief Complaint Chief Complaint  Patient presents with  . Dental Pain    HPI Rebecca Joseph is a 41 y.o. female presenting with right upper dental pain.  Patient states that intermittently over the past week, she's had minimal antral pain. She has been controlling with salt gargles. Today around 5:00, she had increased pain. She took 800 ibuprofen and 2 BC powders without relief. She has had dental abscesses requiring tooth extraction in the past, but has been several years. Pain is of the right upper molar. She denies fever, chills, vision changes, chest mass, difficulty handling secretions, pain in the neck, or vomiting. She has no medical problems, does not take any medications on a daily basis. She is not immunocompromised. She has no recent URI symptoms.  HPI  Past Medical History:  Diagnosis Date  . Anemia   . Gout     Patient Active Problem List   Diagnosis Date Noted  . ANEMIA 04/29/2008  . OBESITY 04/27/2008  . TOBACCO ABUSE 02/18/2008  . FIBROIDS, UTERUS 04/08/2006    Past Surgical History:  Procedure Laterality Date  . CESAREAN SECTION    . TUBAL LIGATION      OB History    Gravida Para Term Preterm AB Living   4 3 3   1 3    SAB TAB Ectopic Multiple Live Births   1               Home Medications    Prior to Admission medications   Medication Sig Start Date End Date Taking? Authorizing Provider  colchicine 0.6 MG tablet Take 1 tablet (0.6 mg total) by mouth 2 (two) times daily. prn 11/28/16 12/03/16  Argentina Donovan, PA-C  ferrous sulfate 325 (65 FE) MG tablet Take 1 tablet (325 mg total) by mouth daily with breakfast. 11/28/16   Argentina Donovan, PA-C  ibuprofen (ADVIL,MOTRIN) 800 MG tablet Take 1 tablet (800 mg total) by mouth 3 (three) times daily. 12/01/16   Lashaunta Sicard, PA-C  methylPREDNISolone (MEDROL DOSEPAK) 4 MG TBPK tablet Take  6-5-4-3-2-1 po qd 11/19/16   Lysbeth Penner, FNP  ondansetron [facility]) 8 MG tablet 1/2-1 every 8 hours as needed for nausea 11/28/16   Freeman Caldron M, PA-C  penicillin v potassium (VEETID) 500 MG tablet Take 1 tablet (500 mg total) by mouth 4 (four) times daily. 12/01/16 12/08/16  Dalessandro Baldyga, PA-C  predniSONE (DELTASONE) 10 MG tablet Sig: 4 tables once a day for 3 days, 3 tablets once a day X3 days, 2 tablets a day for 3 days, 1 tablet a day for 3 days. Patient not taking: Reported on 05/28/2016 09/18/15   Konrad Felix, PA    Family History Family History  Problem Relation Age of Onset  . Diabetes Father   . Hypertension Father   . Heart disease Father   . Gout Father   . Hypertension Mother   . Breast cancer Maternal Aunt   . Breast cancer Cousin     Social History Social History  Substance Use Topics  . Smoking status: Never Smoker  . Smokeless tobacco: Never Used  . Alcohol use Yes     Comment: 3 mixed drinks a week     Allergies   Naproxen   Review of Systems Review of Systems  Constitutional: Negative for chills and fever.  HENT: Positive for dental  problem. Negative for congestion, sinus pain, sinus pressure and trouble swallowing.   Eyes: Negative for pain and visual disturbance.  Gastrointestinal: Negative for nausea and vomiting.  Allergic/Immunologic: Negative for immunocompromised state.     Physical Exam Updated Vital Signs BP 133/88 (BP Location: Right Arm)   Pulse 85   Temp 98.7 F (37.1 C) (Oral)   Resp 16   Ht 5\' 5"  (1.651 m)   Wt 84.4 kg (186 lb)   LMP 11/20/2016   SpO2 100%   BMI 30.95 kg/m   Physical Exam  Constitutional: She is oriented to person, place, and time. She appears well-developed and well-nourished. No distress.  HENT:  Head: Normocephalic and atraumatic.  Nose: Nose normal.  Mouth/Throat: Uvula is midline, oropharynx is clear and moist and mucous membranes are normal. No trismus in the jaw. Abnormal dentition.  Dental caries present. No uvula swelling.    Tenderness to palpation of right upper molar. No obvious facial swelling. No pain of lower teeth  Eyes: EOM are normal.  Neck: Normal range of motion.  Cardiovascular: Normal rate, regular rhythm and intact distal pulses.   Pulmonary/Chest: Effort normal and breath sounds normal. No respiratory distress. She has no wheezes.  Abdominal: She exhibits no distension.  Musculoskeletal: Normal range of motion.  Neurological: She is alert and oriented to person, place, and time.  Skin: Skin is warm. No rash noted.  Psychiatric: She has a normal mood and affect.  Nursing note and vitals reviewed.    ED Treatments / Results  Labs (all labs ordered are listed, but only abnormal results are displayed) Labs Reviewed - No data to display  EKG  EKG Interpretation None       Radiology No results found.  Procedures Procedures (including critical care time)  Medications Ordered in ED Medications  oxyCODONE-acetaminophen (PERCOCET/ROXICET) 5-325 MG per tablet 1 tablet (1 tablet Oral Given 12/01/16 0040)     Initial Impression / Assessment and Plan / ED Course  I have reviewed the triage vital signs and the nursing notes.  Pertinent labs & imaging results that were available during my care of the patient were reviewed by me and considered in my medical decision making (see chart for details).     Patient presenting with right upper tooth pain, worsening this afternoon. No signs of systemic infection including fever, chills, nausea, or vomiting. No signs of Ludwig's angina or periorbital cellulitis. No gross abscess, but erythema and edema of surrounding gingiva present. Will discharge with antibiotics and anti-inflammatories. Follow-up with dentistry. At this time, patient appears safe discharge. Return precautions given. Patient states she stands agrees to plan.   Final Clinical Impressions(s) / ED Diagnoses   Final diagnoses:  Pain,  dental    New Prescriptions Discharge Medication List as of 12/01/2016 12:27 AM    START taking these medications   Details  penicillin v potassium (VEETID) 500 MG tablet Take 1 tablet (500 mg total) by mouth 4 (four) times daily., Starting Sun 12/01/2016, Until Sun 12/08/2016, [place], Mukund Weinreb, PA-C 12/01/16 [place], MD 12/01/16 [phone]

## 2016-12-01 NOTE — ED Notes (Signed)
PT states understanding of care given, follow up care, and medication prescribed. Pt ambulated from ED to car with a steady gait. 

## 2016-12-01 NOTE — Discharge Instructions (Signed)
Take antibiotics as prescribed. Take ibuprofen 3 times a day with meals. Do not take other anti-inflammatories at the same time (Advil, Motrin, naproxen, Aleve). You may supplement with Tylenol as needed. Follow-up with the dentist. You may contact the dentist listed below, or use the resource guide in this packet to find another dentist. Return to the emergency room if you develop fevers, chills, difficulty breathing, difficult swallowing, or any new or worsening symptoms.

## 2016-12-13 ENCOUNTER — Ambulatory Visit: Payer: Self-pay | Attending: Internal Medicine

## 2016-12-23 MED FILL — FERROUS SULFATE 325 MG TAB: 325 (65 FE) | 30 days supply | Qty: 30 | Fill #1

## 2016-12-31 ENCOUNTER — Other Ambulatory Visit: Payer: Self-pay | Admitting: Physician Assistant

## 2016-12-31 DIAGNOSIS — M109 Gout, unspecified: Secondary | ICD-10-CM

## 2016-12-31 MED FILL — COLCHICINE 0.6 MG TABLET: 0.6 | 10 days supply | Qty: 20 | Fill #0

## 2017-01-27 ENCOUNTER — Ambulatory Visit: Payer: Self-pay | Admitting: Family Medicine

## 2017-02-10 ENCOUNTER — Ambulatory Visit: Payer: Self-pay | Admitting: Family Medicine

## 2017-02-27 ENCOUNTER — Emergency Department (HOSPITAL_COMMUNITY)
Admission: EM | Admit: 2017-02-27 | Discharge: 2017-02-27 | Disposition: A | Discharge status: Home / Self Care | Payer: Self-pay | Attending: Emergency Medicine | Admitting: Emergency Medicine

## 2017-02-27 ENCOUNTER — Other Ambulatory Visit: Payer: Self-pay

## 2017-02-27 ENCOUNTER — Encounter (HOSPITAL_COMMUNITY): Payer: Self-pay

## 2017-02-27 DIAGNOSIS — Y939 Activity, unspecified: Secondary | ICD-10-CM | POA: Insufficient documentation

## 2017-02-27 DIAGNOSIS — W268XXA Contact with other sharp object(s), not elsewhere classified, initial encounter: Secondary | ICD-10-CM | POA: Insufficient documentation

## 2017-02-27 DIAGNOSIS — Z79899 Other long term (current) drug therapy: Secondary | ICD-10-CM | POA: Insufficient documentation

## 2017-02-27 DIAGNOSIS — Y929 Unspecified place or not applicable: Secondary | ICD-10-CM | POA: Insufficient documentation

## 2017-02-27 DIAGNOSIS — S61212A Laceration without foreign body of right middle finger without damage to nail, initial encounter: Secondary | ICD-10-CM | POA: Insufficient documentation

## 2017-02-27 DIAGNOSIS — Y998 Other external cause status: Secondary | ICD-10-CM | POA: Insufficient documentation

## 2017-02-27 DIAGNOSIS — Z23 Encounter for immunization: Secondary | ICD-10-CM | POA: Insufficient documentation

## 2017-02-27 MED ORDER — TETANUS-DIPHTH-ACELL PERTUSSIS 5-2.5-18.5 LF-MCG/0.5 IM SUSP
0.5000 mL | Freq: Once | INTRAMUSCULAR | Status: AC
  Administered 2017-02-27: 0.5 mL via INTRAMUSCULAR
  Filled 2017-02-27: qty 0.5

## 2017-02-27 NOTE — Discharge Instructions (Signed)
Please gently wash with warm soapy water beginning tomorrow.  Keep the area covered.  Your tetanus shot was updated in the ER today.  Please return to the emergency department if you develop redness, pain around the laceration.

## 2017-02-27 NOTE — ED Triage Notes (Signed)
Patient here with small laceration to right hand middle finger after cutting same on can top at 0100, no bleeding

## 2017-02-27 NOTE — ED Notes (Signed)
States she was opening a can and the lid fell on her finger. Small laceration to right middle finger. Bleeding controled.

## 2017-02-27 NOTE — ED Provider Notes (Signed)
[facility] EMERGENCY DEPARTMENT Provider Note   CSN: [id number] Arrival date & time: 02/27/17  5916     History   Chief Complaint No chief complaint on file.   HPI Rebecca Joseph is a 41 y.o. female.  HPI   Rebecca Joseph is a 41 year old female with a history of tobacco use, gout who presents emergency department for evaluation of right middle finger laceration.  She states that she cut the finger on a tin can about 7 hours ago.  States that she placed hydrogen peroxide over the wound and Neosporin.  Bleeding stopped with pressure. She endorses minimal burning pain with palpation over the laceration.  She is unsure of her last tetanus vaccination.  Denies numbness, weakness, fever, wound elsewhere.  Past Medical History:  Diagnosis Date  . Anemia   . Gout     Patient Active Problem List   Diagnosis Date Noted  . ANEMIA 04/29/2008  . OBESITY 04/27/2008  . TOBACCO ABUSE 02/18/2008  . FIBROIDS, UTERUS 04/08/2006    Past Surgical History:  Procedure Laterality Date  . CESAREAN SECTION    . TUBAL LIGATION      OB History    Gravida Para Term Preterm AB Living   4 3 3   1 3    SAB TAB Ectopic Multiple Live Births   1               Home Medications    Prior to Admission medications   Medication Sig Start Date End Date Taking? Authorizing Provider  colchicine 0.6 MG tablet Take 1 tablet (0.6 mg total) by mouth 2 (two) times daily. prn 12/31/16   Argentina Donovan, PA-C  ferrous sulfate 325 (65 FE) MG tablet Take 1 tablet (325 mg total) by mouth daily with breakfast. 11/28/16   Argentina Donovan, PA-C  ibuprofen (ADVIL,MOTRIN) 800 MG tablet Take 1 tablet (800 mg total) by mouth 3 (three) times daily. 12/01/16   [place], Sophia, PA-C  methylPREDNISolone (MEDROL DOSEPAK) 4 MG TBPK tablet Take 6-5-4-3-2-1 po qd 11/19/16   Lysbeth Penner, FNP  ondansetron [facility]) 8 MG tablet 1/2-1 every 8 hours as needed for nausea 11/28/16   Freeman Caldron M,  PA-C  predniSONE (DELTASONE) 10 MG tablet Sig: 4 tables once a day for 3 days, 3 tablets once a day X3 days, 2 tablets a day for 3 days, 1 tablet a day for 3 days. Patient not taking: Reported on 05/28/2016 09/18/15   Konrad Felix, PA    Family History Family History  Problem Relation Age of Onset  . Diabetes Father   . Hypertension Father   . Heart disease Father   . Gout Father   . Hypertension Mother   . Breast cancer Maternal Aunt   . Breast cancer Cousin     Social History Social History   Tobacco Use  . Smoking status: Never Smoker  . Smokeless tobacco: Never Used  Substance Use Topics  . Alcohol use: Yes    Comment: 3 mixed drinks a week  . Drug use: No     Allergies   Naproxen   Review of Systems Review of Systems  Constitutional: Negative for fever.  Musculoskeletal: Negative for arthralgias and joint swelling.  Skin: Positive for wound.  Neurological: Negative for weakness and numbness.     Physical Exam Updated Vital Signs BP (!) 149/101 (BP Location: Left Arm)   Pulse 85   Temp 97.7 F (36.5 C) (Oral)  Resp 17   SpO2 100%   Physical Exam  Constitutional: She is oriented to person, place, and time. She appears well-developed and well-nourished. No distress.  HENT:  Head: Normocephalic and atraumatic.  Eyes: Right eye exhibits no discharge. Left eye exhibits no discharge.  Pulmonary/Chest: Effort normal. No respiratory distress.  Musculoskeletal:  Right middle finger with approximately 0.5 cm laceration on the dorsal aspect of the middle phalanx.  Laceration is approximately 1 mm deep.  No surrounding erythema, edema, ecchymosis.  Full active ROM without pain.  Grip strength 5/5.  Radial pulse 2+.  Cap refill less than 2 seconds.  Sensation intact distal to the injury.  Neurological: She is alert and oriented to person, place, and time. Coordination normal.  Skin: Skin is warm and dry. Capillary refill takes less than 2 seconds. She is not  diaphoretic.  Psychiatric: She has a normal mood and affect. Her behavior is normal.  Nursing note and vitals reviewed.    ED Treatments / Results  Labs (all labs ordered are listed, but only abnormal results are displayed) Labs Reviewed - No data to display  EKG  EKG Interpretation None       Radiology No results found.  Procedures Procedures (including critical care time)  Medications Ordered in ED Medications  Tdap (BOOSTRIX) injection 0.5 mL (not administered)     Initial Impression / Assessment and Plan / ED Course  I have reviewed the triage vital signs and the nursing notes.  Pertinent labs & imaging results that were available during my care of the patient were reviewed by me and considered in my medical decision making (see chart for details).    Laceration is superficial, no need for suturing.  I cleaned the wound extensively with sterile saline pressure wash.  Closed with Dermabond.  No erythema, edema, induration to suggest infection.  No tenderness over the phalanx to suggest fracture or deeper injury. Tdap was updated in the ER.  Discussed wound care.  Counseled patient to return if any signs of infection.  Patient's blood pressure was elevated in the ER today, counseled her to have this rechecked.  Patient agreed and voiced understanding to the above plan.  Final Clinical Impressions(s) / ED Diagnoses   Final diagnoses:  Laceration of right middle finger without foreign body without damage to nail, initial encounter    ED Discharge Orders    None       Bernarda Caffey 02/27/17 8546    Lajean Saver, MD 02/27/17 [phone]

## 2017-03-03 ENCOUNTER — Encounter (HOSPITAL_COMMUNITY): Payer: Self-pay

## 2017-03-10 ENCOUNTER — Ambulatory Visit: Payer: Self-pay | Attending: Family Medicine | Admitting: Family Medicine

## 2017-03-10 ENCOUNTER — Encounter: Payer: Self-pay | Admitting: Family Medicine

## 2017-03-10 VITALS — BP 133/87 | HR 104 | Temp 97.8°F | Resp 18 | Ht 65.0 in | Wt 190.0 lb

## 2017-03-10 DIAGNOSIS — D649 Anemia, unspecified: Secondary | ICD-10-CM | POA: Insufficient documentation

## 2017-03-10 DIAGNOSIS — M109 Gout, unspecified: Secondary | ICD-10-CM | POA: Insufficient documentation

## 2017-03-10 DIAGNOSIS — Z79899 Other long term (current) drug therapy: Secondary | ICD-10-CM | POA: Insufficient documentation

## 2017-03-10 DIAGNOSIS — Z7952 Long term (current) use of systemic steroids: Secondary | ICD-10-CM | POA: Insufficient documentation

## 2017-03-10 DIAGNOSIS — Z8742 Personal history of other diseases of the female genital tract: Secondary | ICD-10-CM

## 2017-03-10 DIAGNOSIS — N760 Acute vaginitis: Secondary | ICD-10-CM | POA: Insufficient documentation

## 2017-03-10 DIAGNOSIS — Z87898 Personal history of other specified conditions: Secondary | ICD-10-CM | POA: Insufficient documentation

## 2017-03-10 DIAGNOSIS — Z791 Long term (current) use of non-steroidal anti-inflammatories (NSAID): Secondary | ICD-10-CM | POA: Insufficient documentation

## 2017-03-10 DIAGNOSIS — N920 Excessive and frequent menstruation with regular cycle: Secondary | ICD-10-CM | POA: Insufficient documentation

## 2017-03-10 MED ORDER — FLUCONAZOLE 150 MG PO TABS: 150.0000 mg | ORAL_TABLET | Freq: Once | ORAL | 0 refills | Status: AC

## 2017-03-10 MED FILL — FLUCONAZOLE 150 MG TABLET: 150 | 1 days supply | Qty: 1 | Fill #0

## 2017-03-10 MED FILL — FERROUS SULFATE 325 MG TAB: 325 (65 FE) | 30 days supply | Qty: 30 | Fill #2

## 2017-03-10 NOTE — Progress Notes (Signed)
Subjective:  Patient ID: Rebecca Joseph, female    DOB: [birth year]  Age: 41 y.o. MRN: [id number]  CC: Establish Care   HPI Rebecca Joseph presents to establish care.  History of anemia.  She does report history of heavy menstrual cycles.  She reports seeing a gynecologist less than 1 year ago.  Menstrual cycles typically last 7 days with having bleeding days 1 through 5.  She reports using ultra absorbency tampons and pads with menstrual cycles, she reports using 6-7 tampons per day.  Complaints of vaginal irritation and burning onset 3 days ago.  She reports using vagisil for symptoms with no relief.  She denies any vaginal lesions, vaginal discharge, or dysuria.  History of gout she reports last gout flare was 6 months ago.  Left and right great toe affected.  She reports adherence with use of colchicine for gout and dietary modifications.   Outpatient Medications Prior to Visit  Medication Sig Dispense Refill  . ferrous sulfate 325 (65 FE) MG tablet Take 1 tablet (325 mg total) by mouth daily with breakfast. 100 tablet 3  . colchicine 0.6 MG tablet Take 1 tablet (0.6 mg total) by mouth 2 (two) times daily. prn 20 tablet 0  . ibuprofen (ADVIL,MOTRIN) 800 MG tablet Take 1 tablet (800 mg total) by mouth 3 (three) times daily. 21 tablet 0  . methylPREDNISolone (MEDROL DOSEPAK) 4 MG TBPK tablet Take 6-5-4-3-2-1 po qd 21 tablet 0  . ondansetron (ZOFRAN) 8 MG tablet 1/2-1 every 8 hours as needed for nausea 20 tablet 0  . predniSONE (DELTASONE) 10 MG tablet Sig: 4 tables once a day for 3 days, 3 tablets once a day X3 days, 2 tablets a day for 3 days, 1 tablet a day for 3 days. (Patient not taking: Reported on 05/28/2016) 30 tablet 0   No facility-administered medications prior to visit.     ROS Review of Systems  Constitutional: Negative.   Respiratory: Negative.   Cardiovascular: Negative.   Gastrointestinal: Negative.   Genitourinary: Positive for menstrual problem (heavy  menstrual periods).       Vaginal irritation.  Musculoskeletal: Negative.   Skin: Negative.    Objective:  BP 133/87 (BP Location: Left Arm, Patient Position: Sitting, Cuff Size: Normal)   Pulse (!) 104   Temp 97.8 F (36.6 C) (Oral)   Resp 18   Ht 5\' 5"  (1.651 m)   Wt 190 lb (86.2 kg)   SpO2 100%   BMI 31.62 kg/m   BP/Weight 03/10/2017 02/27/2017 3/54/6568  Systolic BP 127 517 001  Diastolic BP 87 749 88  Wt. (Lbs) 190 - -  BMI 31.62 - -     Physical Exam  Constitutional: She appears well-developed and well-nourished.  HENT:  Mouth/Throat: Oropharynx is clear and moist.  Eyes: Conjunctivae are normal. Pupils are equal, round, and reactive to light.  Neck: No JVD present.  Cardiovascular: Normal rate, regular rhythm, normal heart sounds and intact distal pulses.  Pulmonary/Chest: Effort normal and breath sounds normal.  Abdominal: Soft. Bowel sounds are normal. There is no tenderness.  Genitourinary:  Genitourinary Comments: Urine cyto  Skin: Skin is warm and dry.  Nursing note and vitals reviewed.   Depression screen [facility] 2/9 03/10/2017 11/28/2016  Decreased Interest 0 0  Down, Depressed, Hopeless 0 0  PHQ - 2 Score 0 0  Altered sleeping 0 -  Tired, decreased energy 0 -  Change in appetite 0 -  Feeling bad or failure about yourself  0 -  Trouble concentrating 0 -  Moving slowly or fidgety/restless 0 -  Suicidal thoughts 0 -  PHQ-9 Score 0 -    GAD 7 : Generalized Anxiety Score 03/10/2017 11/28/2016  Nervous, Anxious, on Edge 0 0  Control/stop worrying 0 0  Worry too much - different things 0 0  Trouble relaxing 0 0  Restless 0 0  Easily annoyed or irritable 0 0  Afraid - awful might happen 0 0  Total GAD 7 Score 0 0       Assessment & Plan:   1. Menorrhagia with regular cycle Interventions with hormonal contraceptive discussed patient declines at this time. - Ambulatory referral to Gynecology - US Pelvis Complete; Future - US Transvaginal Non-OB;  Future - CBC  2. Personal history of endometriosis  - Ambulatory referral to Gynecology  3. Chronic anemia Referral for imaging to determine cause. - US Pelvis Complete; Future - US Transvaginal Non-OB; Future - CBC - Iron, TIBC and Ferritin Panel  4. Vulvovaginitis  - Urine cytology ancillary only - fluconazole (DIFLUCAN) 150 MG tablet; Take 1 tablet (150 mg total) by mouth once for 1 dose.  Dispense: 1 tablet; Refill: 0     Follow-up: Return in about 3 months (around 06/08/2017) for Anemia.   Alfonse Spruce FNP

## 2017-03-10 NOTE — Patient Instructions (Signed)
Menorrhagia Menorrhagia is when your menstrual periods are heavy or last longer than usual. Follow these instructions at home:  Only take medicine as told by your doctor.  Take any iron pills as told by your doctor. Heavy bleeding may cause low levels of iron in your body.  Do not take aspirin 1 week before or during your period. Aspirin can make the bleeding worse.  Lie down for a while if you change your tampon or pad more than once in 2 hours. This may help lessen the bleeding.  Eat a healthy diet and foods with iron. These foods include leafy green vegetables, meat, liver, eggs, and whole grain breads and cereals.  Do not try to lose weight. Wait until the heavy bleeding has stopped and your iron level is normal. Contact a doctor if:  You soak through a pad or tampon every 1 or 2 hours, and this happens every time you have a period.  You need to use pads and tampons at the same time because you are bleeding so much.  You need to change your pad or tampon during the night.  You have a period that lasts for more than 8 days.  You pass clots bigger than 1 inch (2.5 cm) wide.  You have irregular periods that happen more or less often than once a month.  You feel dizzy or pass out (faint).  You feel very weak or tired.  You feel short of breath or feel your heart is beating too fast when you exercise.  You feel sick to your stomach (nausea) and you throw up (vomit) while you are taking your medicine.  You have watery poop (diarrhea) while you are taking your medicine.  You have any problems that may be related to the medicine you are taking. Get help right away if:  You soak through 4 or more pads or tampons in 2 hours.  You have any bleeding while you are pregnant. This information is not intended to replace advice given to you by your health care provider. Make sure you discuss any questions you have with your health care provider. Document Released: 01/02/2008 Document  Revised: 08/31/2015 Document Reviewed: 09/24/2012 Elsevier Interactive Patient Education  2017 Elsevier Inc.  

## 2017-03-10 NOTE — Progress Notes (Signed)
Patient is here for establish care   Patient uninsured about flu shot

## 2017-03-11 LAB — IRON,TIBC AND FERRITIN PANEL
Ferritin: 6 ng/mL — ABNORMAL LOW (ref 15–150)
IRON SATURATION: 5 % — AB (ref 15–55)
Iron: 19 ug/dL — ABNORMAL LOW (ref 27–159)
TIBC: 399 ug/dL (ref 250–450)
UIBC: 380 ug/dL (ref 131–425)

## 2017-03-11 LAB — CBC
Hematocrit: 31.3 % — ABNORMAL LOW (ref 34.0–46.6)
Hemoglobin: 9.5 g/dL — ABNORMAL LOW (ref 11.1–15.9)
MCH: 22.4 pg — AB (ref 26.6–33.0)
MCHC: 30.4 g/dL — AB (ref 31.5–35.7)
MCV: 74 fL — AB (ref 79–97)
PLATELETS: 339 10*3/uL (ref 150–379)
RBC: 4.24 x10E6/uL (ref 3.77–5.28)
RDW: 19.4 % — ABNORMAL HIGH (ref 12.3–15.4)
WBC: 10.4 10*3/uL (ref 3.4–10.8)

## 2017-03-13 ENCOUNTER — Other Ambulatory Visit: Payer: Self-pay | Admitting: Family Medicine

## 2017-03-13 ENCOUNTER — Other Ambulatory Visit: Payer: Self-pay | Admitting: Physician Assistant

## 2017-03-13 DIAGNOSIS — M109 Gout, unspecified: Secondary | ICD-10-CM

## 2017-03-13 DIAGNOSIS — D500 Iron deficiency anemia secondary to blood loss (chronic): Secondary | ICD-10-CM

## 2017-03-13 LAB — URINE CYTOLOGY ANCILLARY ONLY
Bacterial vaginitis: NEGATIVE
Candida vaginitis: NEGATIVE

## 2017-03-13 MED ORDER — FERROUS SULFATE 325 (65 FE) MG PO TABS
325.0000 mg | ORAL_TABLET | Freq: Three times a day (TID) | ORAL | 3 refills | Status: DC
Start: 2017-03-13 — End: 2017-12-26

## 2017-03-13 MED FILL — FERROUS SULFATE 325 MG TAB: 325 (65 FE) | 30 days supply | Qty: 90 | Fill #0

## 2017-03-13 MED FILL — COLCHICINE 0.6 MG TABS: 0.6 | 10 days supply | Qty: 20 | Fill #0

## 2017-03-14 ENCOUNTER — Telehealth: Payer: Self-pay

## 2017-03-14 NOTE — Telephone Encounter (Signed)
-----   Message from Alfonse Spruce, [place] sent at 03/13/2017  2:19 PM EST ----- Labs show chronic anemia. Anemia slightly improved from previous labs. You will be prescribed iron supplement. Take on an empty stomach with orange juice for better absorption. Increase fiber and water intake to prevent constipation. Iron levels are very low due to anemia.  -Increase your dietary iron intake. Good sources of iron include dark green leafy vegetables, meats, beans, and iron fortified cereals. Follow up with imaging.

## 2017-03-14 NOTE — Telephone Encounter (Signed)
CMA call regarding lab results   Patient Verify DOB   Patient was aware and understood  

## 2017-03-17 ENCOUNTER — Ambulatory Visit (HOSPITAL_COMMUNITY): Payer: Self-pay

## 2017-03-24 ENCOUNTER — Ambulatory Visit (HOSPITAL_COMMUNITY)
Admission: RE | Admit: 2017-03-24 | Discharge: 2017-03-24 | Disposition: A | Discharge status: Home / Self Care | Payer: Self-pay | Source: Ambulatory Visit | Attending: Family Medicine | Admitting: Family Medicine

## 2017-03-24 DIAGNOSIS — D649 Anemia, unspecified: Secondary | ICD-10-CM | POA: Insufficient documentation

## 2017-03-24 DIAGNOSIS — N920 Excessive and frequent menstruation with regular cycle: Secondary | ICD-10-CM | POA: Insufficient documentation

## 2017-03-28 ENCOUNTER — Other Ambulatory Visit: Payer: Self-pay | Admitting: Family Medicine

## 2017-03-28 DIAGNOSIS — D269 Other benign neoplasm of uterus, unspecified: Secondary | ICD-10-CM

## 2017-04-22 ENCOUNTER — Inpatient Hospital Stay (HOSPITAL_COMMUNITY)
Admission: AD | Admit: 2017-04-22 | Discharge: 2017-04-22 | Disposition: A | Discharge status: Home / Self Care | Payer: Self-pay | Source: Ambulatory Visit | Attending: Obstetrics and Gynecology | Admitting: Obstetrics and Gynecology

## 2017-04-22 ENCOUNTER — Other Ambulatory Visit: Payer: Self-pay

## 2017-04-22 ENCOUNTER — Encounter (HOSPITAL_COMMUNITY): Payer: Self-pay

## 2017-04-22 DIAGNOSIS — R102 Pelvic and perineal pain: Secondary | ICD-10-CM | POA: Insufficient documentation

## 2017-04-22 DIAGNOSIS — Z711 Person with feared health complaint in whom no diagnosis is made: Secondary | ICD-10-CM

## 2017-04-22 NOTE — MAU Provider Note (Signed)
History     CSN: [id number]  Arrival date and time: 04/22/17 5093   First Provider Initiated Contact with Patient 04/22/17 1735      Chief Complaint  Patient presents with  . Foreign Body in Vagina    pt is unsure if tampon is lost in vagina or not   HPI   Patient is a 42 y.o. nonpregnant female who presents for a possible tampon stuck inside her vagina. She states that she had inserted a tampon on Sunday and was unsure if she removed it before inserting a new one. She denies any pain, discharge, or bleeding. She has no other complaints.   OB History    Gravida Para Term Preterm AB Living   4 3 3   1 3    SAB TAB Ectopic Multiple Live Births   1              Past Medical History:  Diagnosis Date  . Anemia   . Gout     Past Surgical History:  Procedure Laterality Date  . CESAREAN SECTION    . TUBAL LIGATION      Family History  Problem Relation Age of Onset  . Diabetes Father   . Hypertension Father   . Heart disease Father   . Gout Father   . Hypertension Mother   . Breast cancer Maternal Aunt   . Breast cancer Cousin     Social History   Tobacco Use  . Smoking status: Never Smoker  . Smokeless tobacco: Never Used  Substance Use Topics  . Alcohol use: Yes    Comment: 3 mixed drinks a week  . Drug use: No    Allergies:  Allergies  Allergen Reactions  . Naproxen Shortness Of Breath    Medications Prior to Admission  Medication Sig Dispense Refill Last Dose  . colchicine 0.6 MG tablet TAKE 1 TABLET BY MOUTH 2 TIMES DAILY AS NEEDED 20 tablet 0   . ferrous sulfate 325 (65 FE) MG tablet Take 1 tablet (325 mg total) by mouth 3 (three) times daily with meals. 90 tablet 3   . ibuprofen (ADVIL,MOTRIN) 800 MG tablet Take 1 tablet (800 mg total) by mouth 3 (three) times daily. 21 tablet 0   . methylPREDNISolone (MEDROL DOSEPAK) 4 MG TBPK tablet Take 6-5-4-3-2-1 po qd 21 tablet 0   . ondansetron (ZOFRAN) 8 MG tablet 1/2-1 every 8 hours as needed for nausea  20 tablet 0   . predniSONE (DELTASONE) 10 MG tablet Sig: 4 tables once a day for 3 days, 3 tablets once a day X3 days, 2 tablets a day for 3 days, 1 tablet a day for 3 days. (Patient not taking: Reported on 05/28/2016) 30 tablet 0 Not Taking    Review of Systems  All other systems reviewed and are negative.  Physical Exam   Blood pressure (!) 142/97, pulse (!) 111, temperature 97.9 F (36.6 C), temperature source Oral, resp. rate 20, height 5\' 5"  (1.651 m), weight 84.7 kg (186 lb 12 oz), last menstrual period 04/18/2017, SpO2 99 %.  Physical Exam  Nursing note and vitals reviewed. Constitutional: She is oriented to person, place, and time. She appears well-developed and well-nourished.  HENT:  Head: Normocephalic and atraumatic.  Genitourinary: Vagina normal.  Neurological: She is alert and oriented to person, place, and time.  Skin: Skin is warm and dry.    MAU Course  Procedures  MDM Pelvic exam with speculum done today. No tampon visualized  Assessment and Plan  Patient is a 42 y.o. Female with concerns of possible tampon stuck in vagina.   1. Physically well but worried Plan: Discharge patient Follow up with GYN as scheduled    Elsworth Soho PA-S2 04/22/2017, 5:35 PM

## 2017-04-22 NOTE — MAU Provider Note (Signed)
History     CSN: [id number]  Arrival date and time: 04/22/17 3295   First Provider Initiated Contact with Patient 04/22/17 1735      Chief Complaint  Patient presents with  . Foreign Body in Vagina    pt is unsure if tampon is lost in vagina or not   HPI Rebecca Joseph is a 42 y.o. [phone] non pregnant female who presents unsure if she has a tampon still in her vagina. She states she used a tampon Sunday night and is unsure if it fell out when she went to the bathroom or if it is still inside. She denies any pain, discharge or bleeding.   OB History    Gravida Para Term Preterm AB Living   4 3 3   1 3    SAB TAB Ectopic Multiple Live Births   1              Past Medical History:  Diagnosis Date  . Anemia   . Gout     Past Surgical History:  Procedure Laterality Date  . CESAREAN SECTION    . TUBAL LIGATION      Family History  Problem Relation Age of Onset  . Diabetes Father   . Hypertension Father   . Heart disease Father   . Gout Father   . Hypertension Mother   . Breast cancer Maternal Aunt   . Breast cancer Cousin     Social History   Tobacco Use  . Smoking status: Never Smoker  . Smokeless tobacco: Never Used  Substance Use Topics  . Alcohol use: Yes    Comment: 3 mixed drinks a week  . Drug use: No    Allergies:  Allergies  Allergen Reactions  . Naproxen Shortness Of Breath    Medications Prior to Admission  Medication Sig Dispense Refill Last Dose  . colchicine 0.6 MG tablet TAKE 1 TABLET BY MOUTH 2 TIMES DAILY AS NEEDED 20 tablet 0   . ferrous sulfate 325 (65 FE) MG tablet Take 1 tablet (325 mg total) by mouth 3 (three) times daily with meals. 90 tablet 3   . ibuprofen (ADVIL,MOTRIN) 800 MG tablet Take 1 tablet (800 mg total) by mouth 3 (three) times daily. 21 tablet 0   . methylPREDNISolone (MEDROL DOSEPAK) 4 MG TBPK tablet Take 6-5-4-3-2-1 po qd 21 tablet 0   . ondansetron (ZOFRAN) 8 MG tablet 1/2-1 every 8 hours as needed for  nausea 20 tablet 0   . predniSONE (DELTASONE) 10 MG tablet Sig: 4 tables once a day for 3 days, 3 tablets once a day X3 days, 2 tablets a day for 3 days, 1 tablet a day for 3 days. (Patient not taking: Reported on 05/28/2016) 30 tablet 0 Not Taking    Review of Systems  Constitutional: Negative.  Negative for fatigue and fever.  HENT: Negative.   Respiratory: Negative.  Negative for shortness of breath.   Cardiovascular: Negative.  Negative for chest pain.  Gastrointestinal: Negative.  Negative for abdominal pain, constipation, diarrhea, nausea and vomiting.  Genitourinary: Negative.  Negative for dysuria.  Neurological: Negative.  Negative for dizziness and headaches.   Physical Exam   Blood pressure (!) 142/97, pulse (!) 111, temperature 97.9 F (36.6 C), temperature source Oral, resp. rate 20, height 5\' 5"  (1.651 m), weight 186 lb 12 oz (84.7 kg), last menstrual period 04/18/2017, SpO2 99 %.  Physical Exam  Nursing note and vitals reviewed. Constitutional: She is oriented to person, place,  and time. She appears well-developed and well-nourished. No distress.  HENT:  Head: Normocephalic.  Eyes: Pupils are equal, round, and reactive to light.  Cardiovascular: Normal rate, regular rhythm and normal heart sounds.  Respiratory: Effort normal and breath sounds normal. No respiratory distress.  GI: Soft. Bowel sounds are normal. She exhibits no distension. There is no tenderness.  Genitourinary:  Genitourinary Comments: No tampon seen in vagina Pelvic exam: Cervix pink, visually closed, without lesion, scant white creamy discharge, vaginal walls and external genitalia normal   Neurological: She is alert and oriented to person, place, and time.  Skin: Skin is warm and dry.  Psychiatric: She has a normal mood and affect. Her behavior is normal. Judgment and thought content normal.    MAU Course  Procedures  MDM Reassurance provided, no retained tampon  Assessment and Plan   1.  Physically well but worried   2. Vaginal pain    -Discharge home in stable condition -Patient advised to follow-up with gyn of choice for routine gyn care -Patient may return to MAU as needed or if her condition were to change or worsen  Wende Mott CNM 04/22/2017, 5:37 PM

## 2017-04-22 NOTE — MAU Note (Signed)
Pt states that Sunday night she put a tampon into her vagina, but was unsure if she took it out or if it came out. She put in another tampon on Monday and thinks it is possible she could have pushed the first one up further.   She denies pain.

## 2017-04-22 NOTE — Discharge Instructions (Signed)
In late 2019, the [facility] will be moving to the [place]. At that time, the MAU (Maternity Admissions Unit), where you are being seen today, will no longer take care of non-pregnant patients. We strongly encourage you to find a doctor's office before that time, so that you can be seen with any GYN concerns, like vaginal discharge, urinary tract infection, etc.. in a timely manner.  In order to make an office visit more convenient, the [facility] for [place] at [facility] will be offering evening hours with same-day appointments, walk-in appointments and scheduled appointments available during this time.  [facility] @ [facility] Hours: Monday - 8am - 7:30 pm with walk-in between 4pm- 7:30 pm Tuesday - 8 am - 5 pm (starting 07/08/17 we will be open late and accepting walk-ins from 4pm - 7:30pm) Wednesday - 8 am - 5 pm (starting 10/08/17 we will be open late and accepting walk-ins from 4pm - 7:30pm) Thursday 8 am - 5 pm (starting 01/08/18 we will be open late and accepting walk-ins from 4pm - 7:30pm) Friday 8 am - 5 pm  For an appointment please call the Center for [place] @ [facility] at [phone]  For urgent needs, [facility] is also available for management of urgent GYN complaints such as vaginal discharge or urinary tract infections.     [employer] for [employer] at [facility]       Phone: [phone]  Center for [employer] at [place] Phone: [place] for [employer] at [place]  Phone: [place] for [place] at [employer]  Phone: [place] [facility] [place] at [place]  Phone: [facility]       Phone: [phone]  [facility]    Phone: [phone]   [facility])    Phone: [facility]    Phone: [phone]  [facility]    Phone: [phone]  [place]    Phone: [phone]  [place] [facility]       Phone: [phone]   [place] [facility]  Phone: [place]    Phone: [phone]  [facility]   Phone: [phone]  [facility]      Phone: [phone]  [facility]    Phone: [phone]

## 2017-05-08 ENCOUNTER — Encounter: Payer: Self-pay | Admitting: Obstetrics & Gynecology

## 2017-05-30 ENCOUNTER — Ambulatory Visit: Payer: Self-pay | Admitting: Obstetrics & Gynecology

## 2017-05-30 ENCOUNTER — Encounter: Payer: Self-pay | Admitting: Obstetrics & Gynecology

## 2017-05-30 VITALS — BP 154/103 | HR 104 | Wt 189.4 lb

## 2017-05-30 DIAGNOSIS — N920 Excessive and frequent menstruation with regular cycle: Secondary | ICD-10-CM

## 2017-05-30 DIAGNOSIS — Z0000 Encounter for general adult medical examination without abnormal findings: Secondary | ICD-10-CM

## 2017-05-30 DIAGNOSIS — D500 Iron deficiency anemia secondary to blood loss (chronic): Secondary | ICD-10-CM

## 2017-05-30 DIAGNOSIS — N800 Endometriosis of uterus: Secondary | ICD-10-CM

## 2017-05-30 DIAGNOSIS — N809 Endometriosis, unspecified: Secondary | ICD-10-CM

## 2017-05-30 DIAGNOSIS — N8003 Adenomyosis of the uterus: Secondary | ICD-10-CM

## 2017-05-30 MED ORDER — NORGESTREL-ETHINYL ESTRADIOL 0.3-30 MG-MCG PO TABS: 1.0000 | ORAL_TABLET | Freq: Every day | ORAL | 11 refills | Status: DC

## 2017-05-30 NOTE — Progress Notes (Signed)
Patient ID: Rebecca Joseph, female   DOB: 1975/06/22, 42 y.o.   MRN: [id number]  No chief complaint on file.   HPI Rebecca Joseph is a 42 y.o. female. Separated P3 (19, 56, and 42 yo sons) here today to discuss her heavy, painful periods. They last 7 days. She is anemic, taking iron TID. Her hbg was 7 last summer, most recently 9 in 12/18. Her u/s done 12/18 showed a small uterus, normal endometrium, with adenomyosis. Many years ago she reports being seen by Dr. Elly Modena (but I can't find any notes). She says that she was in the OR and was told when she woke that there was too much scar tissue to do anything. At [place] she was offered OCPs but she was too worried about weight gain. She does not smoke cigarettes.   HPI  Past Medical History:  Diagnosis Date  . Anemia   . Gout     Past Surgical History:  Procedure Laterality Date  . CESAREAN SECTION    . TUBAL LIGATION      Family History  Problem Relation Age of Onset  . Diabetes Father   . Hypertension Father   . Heart disease Father   . Gout Father   . Hypertension Mother   . Breast cancer Maternal Aunt   . Breast cancer Cousin     Social History Social History   Tobacco Use  . Smoking status: Never Smoker  . Smokeless tobacco: Never Used  Substance Use Topics  . Alcohol use: Yes    Comment: 3 mixed drinks a week  . Drug use: No    Allergies  Allergen Reactions  . Naproxen Shortness Of Breath    Current Outpatient Medications  Medication Sig Dispense Refill  . colchicine 0.6 MG tablet TAKE 1 TABLET BY MOUTH 2 TIMES DAILY AS NEEDED 20 tablet 0  . ferrous sulfate 325 (65 FE) MG tablet Take 1 tablet (325 mg total) by mouth 3 (three) times daily with meals. 90 tablet 3  . ibuprofen (ADVIL,MOTRIN) 800 MG tablet Take 1 tablet (800 mg total) by mouth 3 (three) times daily. 21 tablet 0  . methylPREDNISolone (MEDROL DOSEPAK) 4 MG TBPK tablet Take 6-5-4-3-2-1 po qd 21 tablet 0  .  ondansetron (ZOFRAN) 8 MG tablet 1/2-1 every 8 hours as needed for nausea 20 tablet 0  . predniSONE (DELTASONE) 10 MG tablet Sig: 4 tables once a day for 3 days, 3 tablets once a day X3 days, 2 tablets a day for 3 days, 1 tablet a day for 3 days. (Patient not taking: Reported on 05/28/2016) 30 tablet 0   No current facility-administered medications for this visit.     Review of Systems Review of Systems She has been monogamous for about 2-3 years Occasional dyspareunia She has had a BTL, done with Cesarean section. Works at [employer].  There were no vitals taken for this visit.  Physical Exam Physical Exam  Breathing, conversing, and ambulating normally Well nourished, well hydrated Black female, no apparent distress  Abd- benign  Data Reviewed Measurements: 9.5 x 6.5 x 7.6 cm. Retroverted uterus. No discrete uterine fibroids. Suspected adenomyosis.  Endometrium  Thickness: 7 mm.  No focal abnormality visualized.  Right ovary  Measurements: 4.5 x 2.1 x 2.1 cm. Normal appearance/no adnexal mass.  Left ovary  Measurements: 4.2 x 1.3 x 2.0 cm. Normal appearance/no adnexal mass.  Other findings  No abnormal free fluid.  IMPRESSION: Suspected uterine adenomyosis.  Endometrial complex  measures 7 mm, within normal limits.   Electronically Signed   By: Julian Hy M.D.   On: 03/24/2017 13:13  Assessment    Anemia, menorrhagia, adenomyosis Preventative care    Plan    Can't use OCPs due to her HTN. This being followed at [place] She will sign a form to get a free Mirena Schedule mammogram         Emily Filbert 05/30/2017, 8:42 AM

## 2017-06-23 ENCOUNTER — Other Ambulatory Visit: Payer: Self-pay | Admitting: Pharmacist

## 2017-06-23 DIAGNOSIS — M109 Gout, unspecified: Secondary | ICD-10-CM

## 2017-06-23 MED ORDER — COLCHICINE 0.6 MG PO TABS: ORAL_TABLET | ORAL | 0 refills | Status: DC

## 2017-07-02 MED FILL — COLCHICINE 0.6 MG TABS: 0.6 | 5 days supply | Qty: 10 | Fill #0

## 2017-07-02 MED FILL — FERROUS SULFATE 325 MG TAB: 325 (65 FE) | 30 days supply | Qty: 90 | Fill #1

## 2017-07-14 ENCOUNTER — Encounter: Payer: Self-pay | Admitting: Nurse Practitioner

## 2017-07-14 ENCOUNTER — Ambulatory Visit: Payer: Self-pay | Attending: Nurse Practitioner | Admitting: Nurse Practitioner

## 2017-07-14 VITALS — BP 158/105 | HR 87 | Temp 98.4°F | Ht 65.0 in | Wt 189.2 lb

## 2017-07-14 DIAGNOSIS — Z8249 Family history of ischemic heart disease and other diseases of the circulatory system: Secondary | ICD-10-CM | POA: Insufficient documentation

## 2017-07-14 DIAGNOSIS — I10 Essential (primary) hypertension: Secondary | ICD-10-CM | POA: Insufficient documentation

## 2017-07-14 DIAGNOSIS — E669 Obesity, unspecified: Secondary | ICD-10-CM | POA: Insufficient documentation

## 2017-07-14 DIAGNOSIS — M109 Gout, unspecified: Secondary | ICD-10-CM | POA: Insufficient documentation

## 2017-07-14 DIAGNOSIS — Z79899 Other long term (current) drug therapy: Secondary | ICD-10-CM | POA: Insufficient documentation

## 2017-07-14 DIAGNOSIS — Z886 Allergy status to analgesic agent status: Secondary | ICD-10-CM | POA: Insufficient documentation

## 2017-07-14 DIAGNOSIS — Z833 Family history of diabetes mellitus: Secondary | ICD-10-CM | POA: Insufficient documentation

## 2017-07-14 DIAGNOSIS — Z6831 Body mass index (BMI) 31.0-31.9, adult: Secondary | ICD-10-CM | POA: Insufficient documentation

## 2017-07-14 DIAGNOSIS — Z87891 Personal history of nicotine dependence: Secondary | ICD-10-CM | POA: Insufficient documentation

## 2017-07-14 MED ORDER — HYDROCHLOROTHIAZIDE 25 MG PO TABS: 25.0000 mg | ORAL_TABLET | Freq: Every day | ORAL | 3 refills | Status: DC

## 2017-07-14 MED ORDER — CLONIDINE HCL 0.1 MG PO TABS
0.2000 mg | ORAL_TABLET | Freq: Once | ORAL | Status: AC
  Administered 2017-07-14: 0.2 mg via ORAL

## 2017-07-14 MED FILL — HYDROCHLOROTHIAZIDE 25 MG T: 25 | 30 days supply | Qty: 30 | Fill #0

## 2017-07-14 NOTE — Patient Instructions (Signed)
Hypertension Hypertension is another name for high blood pressure. High blood pressure forces your heart to work harder to pump blood. This can cause problems over time. There are two numbers in a blood pressure reading. There is a top number (systolic) over a bottom number (diastolic). It is best to have a blood pressure below 120/80. Healthy choices can help lower your blood pressure. You may need medicine to help lower your blood pressure if:  Your blood pressure cannot be lowered with healthy choices.  Your blood pressure is higher than 130/80.  Follow these instructions at home: Eating and drinking  If directed, follow the DASH eating plan. This diet includes: ? Filling half of your plate at each meal with fruits and vegetables. ? Filling one quarter of your plate at each meal with whole grains. Whole grains include whole wheat pasta, brown rice, and whole grain bread. ? Eating or drinking low-fat dairy products, such as skim milk or low-fat yogurt. ? Filling one quarter of your plate at each meal with low-fat (lean) proteins. Low-fat proteins include fish, skinless chicken, eggs, beans, and tofu. ? Avoiding fatty meat, cured and processed meat, or chicken with skin. ? Avoiding premade or processed food.  Eat less than 1,500 mg of salt (sodium) a day.  Limit alcohol use to no more than 1 drink a day for nonpregnant women and 2 drinks a day for men. One drink equals 12 oz of beer, 5 oz of wine, or 1 oz of hard liquor. Lifestyle  Work with your doctor to stay at a healthy weight or to lose weight. Ask your doctor what the best weight is for you.  Get at least 30 minutes of exercise that causes your heart to beat faster (aerobic exercise) most days of the week. This may include walking, swimming, or biking.  Get at least 30 minutes of exercise that strengthens your muscles (resistance exercise) at least 3 days a week. This may include lifting weights or pilates.  Do not use any  products that contain nicotine or tobacco. This includes cigarettes and e-cigarettes. If you need help quitting, ask your doctor.  Check your blood pressure at home as told by your doctor.  Keep all follow-up visits as told by your doctor. This is important. Medicines  Take over-the-counter and prescription medicines only as told by your doctor. Follow directions carefully.  Do not skip doses of blood pressure medicine. The medicine does not work as well if you skip doses. Skipping doses also puts you at risk for problems.  Ask your doctor about side effects or reactions to medicines that you should watch for. Contact a doctor if:  You think you are having a reaction to the medicine you are taking.  You have headaches that keep coming back (recurring).  You feel dizzy.  You have swelling in your ankles.  You have trouble with your vision. Get help right away if:  You get a very bad headache.  You start to feel confused.  You feel weak or numb.  You feel faint.  You get very bad pain in your: ? Chest. ? Belly (abdomen).  You throw up (vomit) more than once.  You have trouble breathing. Summary  Hypertension is another name for high blood pressure.  Making healthy choices can help lower blood pressure. If your blood pressure cannot be controlled with healthy choices, you may need to take medicine. This information is not intended to replace advice given to you by your health care   provider. Make sure you discuss any questions you have with your health care provider. Document Released: 09/11/2007 Document Revised: 02/21/2016 Document Reviewed: 02/21/2016 Elsevier Interactive Patient Education  2018 [employer]  DASH Eating Plan DASH stands for "Dietary Approaches to Stop Hypertension." The DASH eating plan is a healthy eating plan that has been shown to reduce high blood pressure (hypertension). It may also reduce your risk for type 2 diabetes, heart disease, and  stroke. The DASH eating plan may also help with weight loss. What are tips for following this plan? General guidelines  Avoid eating more than 2,300 mg (milligrams) of salt (sodium) a day. If you have hypertension, you may need to reduce your sodium intake to 1,500 mg a day.  Limit alcohol intake to no more than 1 drink a day for nonpregnant women and 2 drinks a day for men. One drink equals 12 oz of beer, 5 oz of wine, or 1 oz of hard liquor.  Work with your health care provider to maintain a healthy body weight or to lose weight. Ask what an ideal weight is for you.  Get at least 30 minutes of exercise that causes your heart to beat faster (aerobic exercise) most days of the week. Activities may include walking, swimming, or biking.  Work with your health care provider or diet and nutrition specialist (dietitian) to adjust your eating plan to your individual calorie needs. Reading food labels  Check food labels for the amount of sodium per serving. Choose foods with less than 5 percent of the Daily Value of sodium. Generally, foods with less than 300 mg of sodium per serving fit into this eating plan.  To find whole grains, look for the word "whole" as the first word in the ingredient list. Shopping  Buy products labeled as "low-sodium" or "no salt added."  Buy fresh foods. Avoid canned foods and premade or frozen meals. Cooking  Avoid adding salt when cooking. Use salt-free seasonings or herbs instead of table salt or sea salt. Check with your health care provider or pharmacist before using salt substitutes.  Do not fry foods. Cook foods using healthy methods such as baking, boiling, grilling, and broiling instead.  Cook with heart-healthy oils, such as olive, canola, soybean, or sunflower oil. Meal planning   Eat a balanced diet that includes: ? 5 or more servings of fruits and vegetables each day. At each meal, try to fill half of your plate with fruits and vegetables. ? Up  to 6-8 servings of whole grains each day. ? Less than 6 oz of lean meat, poultry, or fish each day. A 3-oz serving of meat is about the same size as a deck of cards. One egg equals 1 oz. ? 2 servings of low-fat dairy each day. ? A serving of nuts, seeds, or beans 5 times each week. ? Heart-healthy fats. Healthy fats called Omega-3 fatty acids are found in foods such as flaxseeds and coldwater fish, like sardines, salmon, and mackerel.  Limit how much you eat of the following: ? Canned or prepackaged foods. ? Food that is high in trans fat, such as fried foods. ? Food that is high in saturated fat, such as fatty meat. ? Sweets, desserts, sugary drinks, and other foods with added sugar. ? Full-fat dairy products.  Do not salt foods before eating.  Try to eat at least 2 vegetarian meals each week.  Eat more home-cooked food and less restaurant, buffet, and fast food.  When eating at a restaurant,   Canned or prepackaged foods.  ? Food that is high in trans fat, such as fried foods.  ? Food that is high in saturated fat, such as fatty meat.  ? Sweets, desserts, sugary drinks, and other foods with added sugar.  ? Full-fat dairy products.   Do not salt foods before eating.   Try to eat at least 2 vegetarian meals each week.   Eat more home-cooked food and less restaurant, buffet, and fast food.   When eating at a restaurant, ask that your food be prepared with less salt or no salt, if possible.  What foods are recommended?  The items listed may not be a complete list. Talk with your dietitian about what dietary choices are best for you.  Grains  Whole-grain or whole-wheat bread. Whole-grain or whole-wheat pasta. Brown rice. Oatmeal. Quinoa. Bulgur. Whole-grain and low-sodium cereals. Pita bread. Low-fat, low-sodium crackers. Whole-wheat flour tortillas.  Vegetables  Fresh or frozen vegetables (raw, steamed, roasted, or grilled). Low-sodium or reduced-sodium tomato and vegetable juice. Low-sodium or reduced-sodium tomato sauce and tomato paste. Low-sodium or reduced-sodium canned vegetables.  Fruits  All fresh, dried, or frozen fruit. Canned fruit in natural juice (without added sugar).  Meat and other protein foods  Skinless chicken or turkey. Ground chicken or turkey. Pork with fat trimmed off. Fish and seafood. Egg whites. Dried beans, peas, or lentils. Unsalted nuts, nut butters, and seeds. Unsalted canned beans. Lean cuts of  beef with fat trimmed off. Low-sodium, lean deli meat.  Dairy  Low-fat (1%) or fat-free (skim) milk. Fat-free, low-fat, or reduced-fat cheeses. Nonfat, low-sodium ricotta or cottage cheese. Low-fat or nonfat yogurt. Low-fat, low-sodium cheese.  Fats and oils  Soft margarine without trans fats. Vegetable oil. Low-fat, reduced-fat, or light mayonnaise and salad dressings (reduced-sodium). Canola, safflower, olive, soybean, and sunflower oils. Avocado.  Seasoning and other foods  Herbs. Spices. Seasoning mixes without salt. Unsalted popcorn and pretzels. Fat-free sweets.  What foods are not recommended?  The items listed may not be a complete list. Talk with your dietitian about what dietary choices are best for you.  Grains  Baked goods made with fat, such as croissants, muffins, or some breads. Dry pasta or rice meal packs.  Vegetables  Creamed or fried vegetables. Vegetables in a cheese sauce. Regular canned vegetables (not low-sodium or reduced-sodium). Regular canned tomato sauce and paste (not low-sodium or reduced-sodium). Regular tomato and vegetable juice (not low-sodium or reduced-sodium). Pickles. Olives.  Fruits  Canned fruit in a light or heavy syrup. Fried fruit. Fruit in cream or butter sauce.  Meat and other protein foods  Fatty cuts of meat. Ribs. Fried meat. Bacon. Sausage. Bologna and other processed lunch meats. Salami. Fatback. Hotdogs. Bratwurst. Salted nuts and seeds. Canned beans with added salt. Canned or smoked fish. Whole eggs or egg yolks. Chicken or turkey with skin.  Dairy  Whole or 2% milk, cream, and half-and-half. Whole or full-fat cream cheese. Whole-fat or sweetened yogurt. Full-fat cheese. Nondairy creamers. Whipped toppings. Processed cheese and cheese spreads.  Fats and oils  Butter. Stick margarine. Lard. Shortening. Ghee. Bacon fat. Tropical oils, such as coconut, palm kernel, or palm oil.  Seasoning and other foods  Salted popcorn and pretzels. Onion salt, garlic salt, seasoned  salt, table salt, and sea salt. Worcestershire sauce. Tartar sauce. Barbecue sauce. Teriyaki sauce. Soy sauce, including reduced-sodium. Steak sauce. Canned and packaged gravies. Fish sauce. Oyster sauce. Cocktail sauce. Horseradish that you find on the shelf. Ketchup. Mustard. Meat flavorings and tenderizers.   fruits and vegetables, whole grains, lean proteins, low-fat dairy, and heart-healthy fats.  Work with your health care provider or diet and nutrition specialist (dietitian) to adjust your eating plan to your individual calorie needs. This information is not intended to replace advice given to you by your health care provider. Make sure you discuss any questions you have with your health care provider. Document Released: 03/14/2011 Document Revised: 03/18/2016 Document Reviewed: 03/18/2016 Elsevier Interactive Patient Education  2018 [employer]  

## 2017-07-14 NOTE — Progress Notes (Addendum)
Assessment & Plan:  Rebecca Joseph was seen today for establish care.  Diagnoses and all orders for this visit:  Essential hypertension -     cloNIDine (CATAPRES) tablet 0.2 mg -     hydrochlorothiazide (HYDRODIURIL) 25 MG tablet; Take 1 tablet (25 mg total) by mouth daily. -     Lipid panel -     VITAMIN D 25 Hydroxy (Vit-D Deficiency, Fractures) Continue all antihypertensives as prescribed.  Remember to bring in your blood pressure log with you for your follow up appointment.  DASH/Mediterranean Diets are healthier choices for HTN.   Obesity Discussed diet and exercise for person with BMI >30. Instructed: You must burn more calories than you eat. Losing 5 percent of your body weight should be considered a success. In the longer term, losing more than 15 percent of your body weight and staying at this weight is an extremely good result. However, keep in mind that even losing 5 percent of your body weight leads to important health benefits, so try not to get discouraged if you're not able to lose more than this. Will recheck weight at next office visit.  Patient has been counseled on age-appropriate routine health concerns for screening and prevention. These are reviewed and up-to-date. Referrals have been placed accordingly. Immunizations are up-to-date or declined.    Subjective:   Chief Complaint  Patient presents with  . Establish Care    Pt is concern with her blood pressure being high lately.    HPI Rebecca Joseph 42 y.o. female presents to office today to establish care. Her blood pressure is elevated today.    Essential Hypertension Blood pressure is elevated today. She has a significant family history of HTN in both her mother and father. She endorses past BP readings also being elevated as well. Denies chest pain, shortness of breath, palpitations, lightheadedness, dizziness, headaches or BLE edema. She is a former smoker and has not smoked in several years. Will start  on HCTZ today and follow up in a few weeks. She has been instructed to log her blood pressures several times a week.  BP Readings from Last 3 Encounters:  07/14/17 (!) 158/105  05/30/17 (!) 154/103  04/22/17 140/86    Review of Systems  Constitutional: Negative for fever, malaise/fatigue and weight loss.  HENT: Negative.  Negative for nosebleeds.   Eyes: Negative.  Negative for blurred vision, double vision and photophobia.  Respiratory: Negative.  Negative for cough and shortness of breath.   Cardiovascular: Negative.  Negative for chest pain, palpitations and leg swelling.  Gastrointestinal: Negative.  Negative for heartburn, nausea and vomiting.  Musculoskeletal: Negative.  Negative for myalgias.  Neurological: Negative.  Negative for dizziness, focal weakness, seizures and headaches.  Psychiatric/Behavioral: Negative.  Negative for suicidal ideas.    Past Medical History:  Diagnosis Date  . Anemia   . Gout     Past Surgical History:  Procedure Laterality Date  . CESAREAN SECTION    . TUBAL LIGATION      Family History  Problem Relation Age of Onset  . Diabetes Father   . Hypertension Father   . Heart disease Father        CHF  . Gout Father   . Hypertension Mother   . Breast cancer Maternal Aunt   . Cancer Maternal Aunt   . Breast cancer Cousin   . Cancer Cousin        Maternal Cousin died from breast cancer    Social History  Reviewed with no changes to be made today.   Outpatient Medications Prior to Visit  Medication Sig Dispense Refill  . colchicine 0.6 MG tablet TAKE 1 TABLET BY MOUTH 2 TIMES DAILY AS NEEDED 20 tablet 0  . ferrous sulfate 325 (65 FE) MG tablet Take 1 tablet (325 mg total) by mouth 3 (three) times daily with meals. 90 tablet 3  . ibuprofen (ADVIL,MOTRIN) 800 MG tablet Take 1 tablet (800 mg total) by mouth 3 (three) times daily. (Patient not taking: Reported on 05/30/2017) 21 tablet 0  . methylPREDNISolone (MEDROL DOSEPAK) 4 MG TBPK tablet  Take 6-5-4-3-2-1 po qd (Patient not taking: Reported on 05/30/2017) 21 tablet 0  . ondansetron (ZOFRAN) 8 MG tablet 1/2-1 every 8 hours as needed for nausea (Patient not taking: Reported on 05/30/2017) 20 tablet 0  . predniSONE (DELTASONE) 10 MG tablet Sig: 4 tables once a day for 3 days, 3 tablets once a day X3 days, 2 tablets a day for 3 days, 1 tablet a day for 3 days. (Patient not taking: Reported on 05/28/2016) 30 tablet 0   No facility-administered medications prior to visit.     Allergies  Allergen Reactions  . Naproxen Shortness Of Breath   [phone] Sarah     Objective:    BP (!) 158/105 (BP Location: Left Arm, Cuff Size: Normal)   Pulse 87   Temp 98.4 F (36.9 C) (Oral)   Ht 5\' 5"  (1.651 m)   Wt 189 lb 3.2 oz (85.8 kg)   LMP 07/13/2017   SpO2 99%   BMI 31.48 kg/m  Wt Readings from Last 3 Encounters:  07/14/17 189 lb 3.2 oz (85.8 kg)  05/30/17 189 lb 6.4 oz (85.9 kg)  04/22/17 186 lb 12 oz (84.7 kg)    Physical Exam  Constitutional: She is oriented to person, place, and time. She appears well-developed and well-nourished. She is cooperative.  HENT:  Head: Normocephalic and atraumatic.  Eyes: EOM are normal.  Neck: Normal range of motion.  Cardiovascular: Normal rate, regular rhythm and normal heart sounds. Exam reveals no gallop and no friction rub.  No murmur heard. Pulmonary/Chest: Effort normal and breath sounds normal. No tachypnea. No respiratory distress. She has no decreased breath sounds. She has no wheezes. She has no rhonchi. She has no rales. She exhibits no tenderness.  Abdominal: Soft. Bowel sounds are normal.  Musculoskeletal: Normal range of motion. She exhibits no edema.  Neurological: She is alert and oriented to person, place, and time. Coordination normal.  Skin: Skin is warm and dry.  Psychiatric: She has a normal mood and affect. Her behavior is normal. Judgment and thought content normal.  Nursing note and vitals reviewed.      Patient  has been counseled extensively about nutrition and exercise as well as the importance of adherence with medications and regular follow-up. The patient was given clear instructions to go to ER or return to medical center if symptoms don't improve, worsen or new problems develop. The patient verbalized understanding.   Follow-up: Return in about 3 weeks (around 08/04/2017) for BP recheck.   [name], FNP-BC [facility] and [place] [place], [place]   07/14/2017, 1:17 PM

## 2017-07-14 NOTE — Progress Notes (Signed)
lon

## 2017-07-15 LAB — LIPID PANEL
CHOL/HDL RATIO: 3.8 ratio (ref 0.0–4.4)
Cholesterol, Total: 159 mg/dL (ref 100–199)
HDL: 42 mg/dL (ref >39)
Triglycerides: 404 mg/dL — ABNORMAL HIGH (ref 0–149)

## 2017-07-15 LAB — VITAMIN D 25 HYDROXY (VIT D DEFICIENCY, FRACTURES): VIT D 25 HYDROXY: 9.4 ng/mL — AB (ref 30.0–100.0)

## 2017-07-20 ENCOUNTER — Other Ambulatory Visit: Payer: Self-pay | Admitting: Nurse Practitioner

## 2017-07-20 MED ORDER — VITAMIN D (ERGOCALCIFEROL) 1.25 MG (50000 UNIT) PO CAPS: 50000.0000 [IU] | ORAL_CAPSULE | ORAL | 1 refills | Status: DC

## 2017-07-20 MED ORDER — GEMFIBROZIL 600 MG PO TABS: 600.0000 mg | ORAL_TABLET | Freq: Two times a day (BID) | ORAL | 3 refills | Status: DC

## 2017-07-21 MED FILL — GEMFIBROZIL 600 MG TABS: 600 | 30 days supply | Qty: 60 | Fill #0

## 2017-07-21 MED FILL — VIT D2 1.25 MG (50,000 UNIT: 1.25 MG | 28 days supply | Qty: 4 | Fill #0

## 2017-08-12 ENCOUNTER — Other Ambulatory Visit: Payer: Self-pay | Admitting: Internal Medicine

## 2017-08-12 DIAGNOSIS — M109 Gout, unspecified: Secondary | ICD-10-CM

## 2017-08-12 MED ORDER — COLCHICINE 0.6 MG PO TABS: ORAL_TABLET | ORAL | 0 refills | Status: DC

## 2017-08-12 MED FILL — !COLCRYS 0.6 MG TABLET: 0.6 MG | 10 days supply | Qty: 20 | Fill #0

## 2017-08-15 MED FILL — HYDROCHLOROTHIAZIDE 25 MG T: 25 | 30 days supply | Qty: 30 | Fill #1

## 2017-08-20 MED FILL — VIT D2 1.25 MG (50,000 UNIT: 1.25 MG | 28 days supply | Qty: 4 | Fill #1

## 2017-08-27 MED FILL — GEMFIBROZIL 600 MG TABS: 600 | 30 days supply | Qty: 60 | Fill #1

## 2017-09-16 MED FILL — HYDROCHLOROTHIAZIDE 25 MG T: 25 | 30 days supply | Qty: 30 | Fill #2

## 2017-09-29 MED FILL — VIT D2 1.25 MG (50,000 UNIT: 1.25 MG | 28 days supply | Qty: 4 | Fill #2

## 2017-09-29 MED FILL — GEMFIBROZIL 600 MG TABS: 600 | 30 days supply | Qty: 60 | Fill #2

## 2017-10-10 MED FILL — !COLCRYS 0.6 MG TABLET: 0.6 MG | 5 days supply | Qty: 10 | Fill #1

## 2017-10-20 ENCOUNTER — Encounter: Payer: Self-pay | Admitting: Nurse Practitioner

## 2017-10-20 ENCOUNTER — Ambulatory Visit: Payer: Self-pay | Attending: Nurse Practitioner | Admitting: Nurse Practitioner

## 2017-10-20 VITALS — BP 122/87 | HR 109 | Temp 98.8°F | Ht 65.0 in | Wt 182.2 lb

## 2017-10-20 DIAGNOSIS — E559 Vitamin D deficiency, unspecified: Secondary | ICD-10-CM | POA: Insufficient documentation

## 2017-10-20 DIAGNOSIS — E781 Pure hyperglyceridemia: Secondary | ICD-10-CM | POA: Insufficient documentation

## 2017-10-20 DIAGNOSIS — N920 Excessive and frequent menstruation with regular cycle: Secondary | ICD-10-CM | POA: Insufficient documentation

## 2017-10-20 DIAGNOSIS — D500 Iron deficiency anemia secondary to blood loss (chronic): Secondary | ICD-10-CM | POA: Insufficient documentation

## 2017-10-20 DIAGNOSIS — I10 Essential (primary) hypertension: Secondary | ICD-10-CM | POA: Insufficient documentation

## 2017-10-20 MED ORDER — HYDROCHLOROTHIAZIDE 25 MG PO TABS: 25.0000 mg | ORAL_TABLET | Freq: Every day | ORAL | 3 refills | Status: DC

## 2017-10-20 MED ORDER — VITAMIN D (ERGOCALCIFEROL) 1.25 MG (50000 UNIT) PO CAPS: 50000.0000 [IU] | ORAL_CAPSULE | ORAL | 1 refills | Status: DC

## 2017-10-20 MED FILL — HYDROCHLOROTHIAZIDE 25 MG T: 25 | 30 days supply | Qty: 30 | Fill #0

## 2017-10-20 MED FILL — VIT D2 1.25 MG (50,000 UNIT: 1.25 MG | 28 days supply | Qty: 4 | Fill #3

## 2017-10-20 NOTE — Progress Notes (Signed)
Assessment & Plan:  Rebecca Joseph was seen today for follow-up.  Diagnoses and all orders for this visit:  Essential hypertension -     hydrochlorothiazide (HYDRODIURIL) 25 MG tablet; Take 1 tablet (25 mg total) by mouth daily. Continue all antihypertensives as prescribed.  Remember to bring in your blood pressure log with you for your follow up appointment.  DASH/Mediterranean Diets are healthier choices for HTN.    Vitamin D deficiency disease -     Vitamin D, Ergocalciferol, (DRISDOL) 50000 units CAPS capsule; Take 1 capsule (50,000 Units total) by mouth every 7 (seven) days. -     VITAMIN D 25 Hydroxy (Vit-D Deficiency, Fractures)  Iron deficiency anemia due to chronic blood loss -     CBC Continue ferrous sulfate as instructed.  Follow up with GYN for options to help treat menorrhagia  Hypertriglyceridemia -     Lipid panel INSTRUCTIONS: Work on a low fat, heart healthy diet and participate in regular aerobic exercise program by working out at least 150 minutes per week. No fried foods. No junk foods, sodas, sugary drinks, unhealthy snacking, alcohol or smoking.       Patient has been counseled on age-appropriate routine health concerns for screening and prevention. These are reviewed and up-to-date. Referrals have been placed accordingly. Immunizations are up-to-date or declined.    Subjective:   Chief Complaint  Patient presents with  . Follow-up    Pt. is here for blood pressure check.    HPI Rebecca Joseph 42 y.o. female presents to office today for follow up HTN.   Essential Hypertension CHRONIC HYPERTENSION Stable/well controlled.  Disease Monitoring  Blood pressure range: She has been monitoring at home but can not recall specific readings but "they have all been good"  Chest pain: no   Dyspnea: no   Claudication: no  Medication compliance: yes; taking HCTZ  Medication Side Effects  Lightheadedness: no   Urinary frequency: no   Edema: no    Impotence: no  Preventitive Healthcare:  Exercise: no   Diet Pattern: salt not added to cooking  Salt Restriction:  no  BP Readings from Last 3 Encounters:  10/20/17 122/87  07/14/17 (!) 158/105  05/30/17 (!) 154/103   Anemia Patient presents for presents evaluation of anemia. Anemia was found by routine CBC.  It has been present for several years.  Associated signs & symptoms: fatigue. She endorses dysmenorrhea and menorrhagia. She has not been taking her iron as instructed. Recently was referred to [facility] (Dr. Hulan Fray 05-30-2017). Due to her HTN the mirena/lilleta was suggested in lieu of OCPs. She has not followed up.   Hyperlipidemia Patient presents for follow up to hyperlipidemia.  She is medication compliant. She is not diet compliant and denies skin xanthelasma or statin intolerance including myalgias. Fasting lipid panel results pending.  Lab Results  Component Value Date   CHOL 159 07/14/2017   Lab Results  Component Value Date   HDL 42 07/14/2017   Lab Results  Component Value Date   LDLCALC Comment 07/14/2017   Lab Results  Component Value Date   TRIG 404 (H) 07/14/2017   Lab Results  Component Value Date   CHOLHDL 3.8 07/14/2017   No results found for: LDLDIRECT Review of Systems  Constitutional: Positive for malaise/fatigue. Negative for fever and weight loss.  HENT: Negative.  Negative for nosebleeds.   Eyes: Negative.  Negative for blurred vision, double vision and photophobia.  Respiratory: Negative.  Negative for cough and  shortness of breath.   Cardiovascular: Negative.  Negative for chest pain, palpitations and leg swelling.  Gastrointestinal: Negative for abdominal pain, blood in stool, constipation, diarrhea, heartburn, melena, nausea and vomiting.  Genitourinary:       SEE HPI  Musculoskeletal: Negative.  Negative for myalgias.  Neurological: Negative.  Negative for dizziness (initially with taking HCTZ; currently resolved),  focal weakness, seizures and headaches.  Psychiatric/Behavioral: Negative.  Negative for suicidal ideas.    Past Medical History:  Diagnosis Date  . Anemia   . Gout     Past Surgical History:  Procedure Laterality Date  . CESAREAN SECTION    . TUBAL LIGATION      Family History  Problem Relation Age of Onset  . Diabetes Father   . Hypertension Father   . Heart disease Father        CHF  . Gout Father   . Hypertension Mother   . Breast cancer Maternal Aunt   . Cancer Maternal Aunt   . Breast cancer Cousin   . Cancer Cousin        Maternal Cousin died from breast cancer    Social History Reviewed with no changes to be made today.   Outpatient Medications Prior to Visit  Medication Sig Dispense Refill  . colchicine 0.6 MG tablet TAKE 1 TABLET BY MOUTH 2 TIMES DAILY AS NEEDED 20 tablet 0  . ferrous sulfate 325 (65 FE) MG tablet Take 1 tablet (325 mg total) by mouth 3 (three) times daily with meals. 90 tablet 3  . hydrochlorothiazide (HYDRODIURIL) 25 MG tablet Take 1 tablet (25 mg total) by mouth daily. 90 tablet 3  . Vitamin D, Ergocalciferol, (DRISDOL) 50000 units CAPS capsule Take 1 capsule (50,000 Units total) by mouth every 7 (seven) days. 12 capsule 1  . gemfibrozil (LOPID) 600 MG tablet Take 1 tablet (600 mg total) by mouth 2 (two) times daily before a meal. 180 tablet 3   No facility-administered medications prior to visit.     Allergies  Allergen Reactions  . Naproxen Shortness Of Breath       Objective:    BP 122/87 (BP Location: Left Arm, Patient Position: Sitting, Cuff Size: Large)   Pulse (!) 109   Temp 98.8 F (37.1 C) (Oral)   Ht 5\' 5"  (1.651 m)   Wt 182 lb 3.2 oz (82.6 kg)   SpO2 100%   BMI 30.32 kg/m  Wt Readings from Last 3 Encounters:  10/20/17 182 lb 3.2 oz (82.6 kg)  07/14/17 189 lb 3.2 oz (85.8 kg)  05/30/17 189 lb 6.4 oz (85.9 kg)    Physical Exam  Constitutional: She is oriented to person, place, and time. She appears  well-developed and well-nourished. She is cooperative.  HENT:  Head: Normocephalic and atraumatic.  Eyes: EOM are normal.  Neck: Normal range of motion.  Cardiovascular: Regular rhythm, normal heart sounds and intact distal pulses. Tachycardia present. Exam reveals no gallop and no friction rub.  No murmur heard. Pulmonary/Chest: Effort normal and breath sounds normal. No tachypnea. No respiratory distress. She has no decreased breath sounds. She has no wheezes. She has no rhonchi. She has no rales. She exhibits no tenderness.  Abdominal: Bowel sounds are normal.  Musculoskeletal: Normal range of motion. She exhibits no edema.  Neurological: She is alert and oriented to person, place, and time. Coordination normal.  Skin: Skin is warm and dry.  Psychiatric: She has a normal mood and affect. Her behavior is normal. Judgment and  thought content normal.  Nursing note and vitals reviewed.       Patient has been counseled extensively about nutrition and exercise as well as the importance of adherence with medications and regular follow-up. The patient was given clear instructions to go to ER or return to medical center if symptoms don't improve, worsen or new problems develop. The patient verbalized understanding.   Follow-up: Return in about 2 months (around 12/21/2017) for Physical .   [name], FNP-BC [facility] and [facility], [place]   10/20/2017, 12:13 PM

## 2017-10-20 NOTE — Patient Instructions (Signed)
Anemia Anemia is a condition in which you do not have enough red blood cells or hemoglobin. Hemoglobin is a substance in red blood cells that carries oxygen. When you do not have enough red blood cells or hemoglobin (are anemic), your body cannot get enough oxygen and your organs may not work properly. As a result, you may feel very tired or have other problems. What are the causes? Common causes of anemia include:  Excessive bleeding. Anemia can be caused by excessive bleeding inside or outside the body, including bleeding from the intestine or from periods in women.  Poor nutrition.  Long-lasting (chronic) kidney, thyroid, and liver disease.  Bone marrow disorders.  Cancer and treatments for cancer.  HIV (human immunodeficiency virus) and AIDS (acquired immunodeficiency syndrome).  Treatments for HIV and AIDS.  Spleen problems.  Blood disorders.  Infections, medicines, and autoimmune disorders that destroy red blood cells.  What are the signs or symptoms? Symptoms of this condition include:  Minor weakness.  Dizziness.  Headache.  Feeling heartbeats that are irregular or faster than normal (palpitations).  Shortness of breath, especially with exercise.  Paleness.  Cold sensitivity.  Indigestion.  Nausea.  Difficulty sleeping.  Difficulty concentrating.  Symptoms may occur suddenly or develop slowly. If your anemia is mild, you may not have symptoms. How is this diagnosed? This condition is diagnosed based on:  Blood tests.  Your medical history.  A physical exam.  Bone marrow biopsy.  Your health care provider may also check your stool (feces) for blood and may do additional testing to look for the cause of your bleeding. You may also have other tests, including:  Imaging tests, such as a CT scan or MRI.  Endoscopy.  Colonoscopy.  How is this treated? Treatment for this condition depends on the cause. If you continue to lose a lot of blood,  you may need to be treated at a hospital. Treatment may include:  Taking supplements of iron, vitamin B12, or folic acid.  Taking a hormone medicine (erythropoietin) that can help to stimulate red blood cell growth.  Having a blood transfusion. This may be needed if you lose a lot of blood.  Making changes to your diet.  Having surgery to remove your spleen.  Follow these instructions at home:  Take over-the-counter and prescription medicines only as told by your health care provider.  Take supplements only as told by your health care provider.  Follow any diet instructions that you were given.  Keep all follow-up visits as told by your health care provider. This is important. Contact a health care provider if:  You develop new bleeding anywhere in the body. Get help right away if:  You are very weak.  You are short of breath.  You have pain in your abdomen or chest.  You are dizzy or feel faint.  You have trouble concentrating.  You have bloody or black, tarry stools.  You vomit repeatedly or you vomit up blood. Summary  Anemia is a condition in which you do not have enough red blood cells or enough of a substance in your red blood cells that carries oxygen (hemoglobin).  Symptoms may occur suddenly or develop slowly.  If your anemia is mild, you may not have symptoms.  This condition is diagnosed with blood tests as well as a medical history and physical exam. Other tests may be needed.  Treatment for this condition depends on the cause of the anemia. This information is not intended to replace advice   given to you by your health care provider. Make sure you discuss any questions you have with your health care provider. Document Released: 05/02/2004 Document Revised: 04/26/2016 Document Reviewed: 04/26/2016 Elsevier Interactive Patient Education  [employer].

## 2017-10-21 LAB — LIPID PANEL
CHOL/HDL RATIO: 3.1 ratio (ref 0.0–4.4)
CHOLESTEROL TOTAL: 174 mg/dL (ref 100–199)
HDL: 57 mg/dL (ref >39)
LDL Calculated: 98 mg/dL (ref 0–99)
Triglycerides: 96 mg/dL (ref 0–149)
VLDL Cholesterol Cal: 19 mg/dL (ref 5–40)

## 2017-10-21 LAB — CBC
HEMATOCRIT: 30.2 % — AB (ref 34.0–46.6)
Hemoglobin: 9.7 g/dL — ABNORMAL LOW (ref 11.1–15.9)
MCH: 24.7 pg — ABNORMAL LOW (ref 26.6–33.0)
MCHC: 32.1 g/dL (ref 31.5–35.7)
MCV: 77 fL — ABNORMAL LOW (ref 79–97)
PLATELETS: 459 10*3/uL — AB (ref 150–450)
RBC: 3.92 x10E6/uL (ref 3.77–5.28)
RDW: 15.4 % (ref 12.3–15.4)
WBC: 6.3 10*3/uL (ref 3.4–10.8)

## 2017-10-21 LAB — VITAMIN D 25 HYDROXY (VIT D DEFICIENCY, FRACTURES): Vit D, 25-Hydroxy: 34.3 ng/mL (ref 30.0–100.0)

## 2017-10-24 ENCOUNTER — Other Ambulatory Visit: Payer: Self-pay | Admitting: Obstetrics and Gynecology

## 2017-10-24 DIAGNOSIS — Z1231 Encounter for screening mammogram for malignant neoplasm of breast: Secondary | ICD-10-CM

## 2017-11-12 MED FILL — GEMFIBROZIL 600 MG TAB: 600 | 30 days supply | Qty: 60 | Fill #3

## 2017-11-17 MED FILL — HYDROCHLOROTHIAZIDE 25 MG T: 25 | 30 days supply | Qty: 30 | Fill #1

## 2017-11-25 ENCOUNTER — Ambulatory Visit (HOSPITAL_COMMUNITY)
Admission: RE | Admit: 2017-11-25 | Discharge: 2017-11-25 | Disposition: A | Discharge status: Home / Self Care | Payer: Self-pay | Source: Ambulatory Visit | Attending: Nurse Practitioner | Admitting: Nurse Practitioner

## 2017-11-25 ENCOUNTER — Ambulatory Visit: Payer: Self-pay | Attending: Nurse Practitioner | Admitting: Nurse Practitioner

## 2017-11-25 ENCOUNTER — Encounter: Payer: Self-pay | Admitting: Nurse Practitioner

## 2017-11-25 VITALS — BP 130/93 | HR 107 | Temp 98.5°F | Ht 65.0 in | Wt 183.0 lb

## 2017-11-25 DIAGNOSIS — R000 Tachycardia, unspecified: Secondary | ICD-10-CM | POA: Insufficient documentation

## 2017-11-25 DIAGNOSIS — M7989 Other specified soft tissue disorders: Secondary | ICD-10-CM | POA: Insufficient documentation

## 2017-11-25 DIAGNOSIS — M25572 Pain in left ankle and joints of left foot: Secondary | ICD-10-CM | POA: Insufficient documentation

## 2017-11-25 MED ORDER — METOPROLOL SUCCINATE ER 25 MG PO TB24: 25.0000 mg | ORAL_TABLET | Freq: Every day | ORAL | 3 refills | Status: DC

## 2017-11-25 MED ORDER — METOPROLOL SUCCINATE ER 25 MG PO TB24: 12.5000 mg | ORAL_TABLET | Freq: Every day | ORAL | 0 refills | Status: DC

## 2017-11-25 MED FILL — METOPROLOL SUCCINATE ER 25: 25 | 90 days supply | Qty: 45 | Fill #0

## 2017-11-25 NOTE — Patient Instructions (Signed)
Ankle Sprain  An ankle sprain is a stretch or tear in one of the tough tissues (ligaments) in your ankle.  Follow these instructions at home:   Rest your ankle.   Take over-the-counter and prescription medicines only as told by your doctor.   For 2-3 days, keep your ankle higher than the level of your heart (elevated) as much as possible.   If directed, put ice on the area:  ? Put ice in a plastic bag.  ? Place a towel between your skin and the bag.  ? Leave the ice on for 20 minutes, 2-3 times a day.   If you were given a brace:  ? Wear it as told.  ? Take it off to shower or bathe.  ? Try not to move your ankle much, but wiggle your toes from time to time. This helps to prevent swelling.   If you were given an elastic bandage (dressing):  ? Take it off when you shower or bathe.  ? Try not to move your ankle much, but wiggle your toes from time to time. This helps to prevent swelling.  ? Adjust the bandage to make it more comfortable if it feels too tight.  ? Loosen the bandage if you lose feeling in your foot, your foot tingles, or your foot gets cold and blue.   If you have crutches, use them as told by your doctor. Continue to use them until you can walk without feeling pain in your ankle.  Contact a doctor if:   Your bruises or swelling are quickly getting worse.   Your pain does not get better after you take medicine.  Get help right away if:   You cannot feel your toes or foot.   Your toes or your foot looks blue.   You have very bad pain that gets worse.  This information is not intended to replace advice given to you by your health care provider. Make sure you discuss any questions you have with your health care provider.  Document Released: 09/11/2007 Document Revised: 08/31/2015 Document Reviewed: 10/25/2014  Elsevier Interactive Patient Education  2018 Elsevier Inc[employer]

## 2017-11-25 NOTE — Progress Notes (Signed)
Assessment & Plan:  Rebecca Joseph was seen today for ankle pain.  Diagnoses and all orders for this visit:  Acute left ankle pain -     DG Ankle Complete Left; Future RICE instructions given   Tachycardia -     metoprolol succinate (TOPROL-XL) 25 MG 24 hr tablet; Take 0.5 tablets (12.5 mg total) by mouth daily.   Patient has been counseled on age-appropriate routine health concerns for screening and prevention. These are reviewed and up-to-date. Referrals have been placed accordingly. Immunizations are up-to-date or declined.    Subjective:   Chief Complaint  Patient presents with  . Ankle Pain    Pt. is here for ankle pain. Pt. stated she may have sprain her ankle yesterday. Pt. stated putting weight on hurts her ankle.    HPI Rebecca Joseph 42 y.o. female presents to office today with complaints of left ankle pain. Blood pressure is slightly elevated.   Ankle Pain Patient complains of left ankle pain.  Onset of the symptoms was yesterday. Inciting event: injured while walking, inverted while walking in slides. Current symptoms include ability to bear weight, but with some pain, pain at the medial aspect of the ankle, pain with inversion of the foot, pain with eversion of the foot and swelling.  Aggravating symptoms: any weight bearing. Patient's overall course: waxing and waning but worse overall. Patient has had no prior ankle problems. Previous visits for this problem: none.  Evaluation to date: none.  Treatment to date: ice.   Review of Systems  Constitutional: Negative for fever, malaise/fatigue and weight loss.  HENT: Negative for nosebleeds.   Eyes: Negative.  Negative for blurred vision, double vision and photophobia.  Respiratory: Negative.  Negative for cough and shortness of breath.   Cardiovascular: Negative.  Negative for chest pain, palpitations and leg swelling.  Gastrointestinal: Negative.  Negative for heartburn, nausea and vomiting.  Musculoskeletal:  Positive for joint pain. Negative for myalgias.  Neurological: Negative.  Negative for dizziness, focal weakness, seizures and headaches.  Psychiatric/Behavioral: Negative.  Negative for suicidal ideas.    Past Medical History:  Diagnosis Date  . Anemia   . Gout     Past Surgical History:  Procedure Laterality Date  . CESAREAN SECTION    . TUBAL LIGATION      Family History  Problem Relation Age of Onset  . Diabetes Father   . Hypertension Father   . Heart disease Father        CHF  . Gout Father   . Hypertension Mother   . Breast cancer Maternal Aunt   . Cancer Maternal Aunt   . Breast cancer Cousin   . Cancer Cousin        Maternal Cousin died from breast cancer    Social History Reviewed with no changes to be made today.   Outpatient Medications Prior to Visit  Medication Sig Dispense Refill  . colchicine 0.6 MG tablet TAKE 1 TABLET BY MOUTH 2 TIMES DAILY AS NEEDED 20 tablet 0  . ferrous sulfate 325 (65 FE) MG tablet Take 1 tablet (325 mg total) by mouth 3 (three) times daily with meals. 90 tablet 3  . hydrochlorothiazide (HYDRODIURIL) 25 MG tablet Take 1 tablet (25 mg total) by mouth daily. 90 tablet 3  . Vitamin D, Ergocalciferol, (DRISDOL) 50000 units CAPS capsule Take 1 capsule (50,000 Units total) by mouth every 7 (seven) days. 12 capsule 1  . gemfibrozil (LOPID) 600 MG tablet Take 1 tablet (600 mg total)  by mouth 2 (two) times daily before a meal. 180 tablet 3   No facility-administered medications prior to visit.     Allergies  Allergen Reactions  . Naproxen Shortness Of Breath       Objective:    BP (!) 130/93 (BP Location: Left Arm, Patient Position: Sitting, Cuff Size: Normal)   Pulse (!) 107   Temp 98.5 F (36.9 C) (Oral)   Ht 5\' 5"  (1.651 m)   Wt 183 lb (83 kg)   LMP 10/06/2017   SpO2 100%   BMI 30.45 kg/m  Wt Readings from Last 3 Encounters:  11/25/17 183 lb (83 kg)  10/20/17 182 lb 3.2 oz (82.6 kg)  07/14/17 189 lb 3.2 oz (85.8 kg)     Physical Exam  Constitutional: She is oriented to person, place, and time. She appears well-developed and well-nourished. She is cooperative.  HENT:  Head: Normocephalic and atraumatic.  Cardiovascular: Regular rhythm, normal heart sounds and intact distal pulses. Tachycardia present. Exam reveals no gallop and no friction rub.  No murmur heard. Pulses:      Dorsalis pedis pulses are 2+ on the right side, and 2+ on the left side.       Posterior tibial pulses are 2+ on the right side, and 2+ on the left side.  Pulmonary/Chest: Effort normal and breath sounds normal. No tachypnea. No respiratory distress. She has no decreased breath sounds. She has no wheezes. She has no rhonchi. She has no rales. She exhibits no tenderness.  Abdominal: Bowel sounds are normal.  Musculoskeletal: She exhibits edema, tenderness and deformity.       Right foot: There is normal range of motion and no deformity.       Left foot: There is decreased range of motion, bony tenderness, swelling and deformity.       Feet:  Neurological: She is alert and oriented to person, place, and time. Coordination normal.  Skin: Skin is warm and dry. No rash noted. No erythema.  Psychiatric: She has a normal mood and affect. Her behavior is normal. Judgment and thought content normal.  Nursing note and vitals reviewed.      Patient has been counseled extensively about nutrition and exercise as well as the importance of adherence with medications and regular follow-up. The patient was given clear instructions to go to ER or return to medical center if symptoms don't improve, worsen or new problems develop. The patient verbalized understanding.   Follow-up: Return if symptoms worsen or fail to improve.   [name], FNP-BC [facility] and [place], [place]   11/25/2017, 12:09 PM

## 2017-11-27 ENCOUNTER — Telehealth: Payer: Self-pay | Admitting: Nurse Practitioner

## 2017-11-27 ENCOUNTER — Encounter (HOSPITAL_COMMUNITY): Payer: Self-pay | Admitting: Emergency Medicine

## 2017-11-27 ENCOUNTER — Other Ambulatory Visit: Payer: Self-pay

## 2017-11-27 ENCOUNTER — Emergency Department (HOSPITAL_COMMUNITY)
Admission: EM | Admit: 2017-11-27 | Discharge: 2017-11-27 | Disposition: A | Discharge status: Home / Self Care | Payer: Self-pay | Attending: Emergency Medicine | Admitting: Emergency Medicine

## 2017-11-27 DIAGNOSIS — Z79899 Other long term (current) drug therapy: Secondary | ICD-10-CM | POA: Insufficient documentation

## 2017-11-27 DIAGNOSIS — Y929 Unspecified place or not applicable: Secondary | ICD-10-CM | POA: Insufficient documentation

## 2017-11-27 DIAGNOSIS — Z87891 Personal history of nicotine dependence: Secondary | ICD-10-CM | POA: Insufficient documentation

## 2017-11-27 DIAGNOSIS — Y998 Other external cause status: Secondary | ICD-10-CM | POA: Insufficient documentation

## 2017-11-27 DIAGNOSIS — Y939 Activity, unspecified: Secondary | ICD-10-CM | POA: Insufficient documentation

## 2017-11-27 DIAGNOSIS — M25572 Pain in left ankle and joints of left foot: Secondary | ICD-10-CM | POA: Insufficient documentation

## 2017-11-27 DIAGNOSIS — X509XXA Other and unspecified overexertion or strenuous movements or postures, initial encounter: Secondary | ICD-10-CM | POA: Insufficient documentation

## 2017-11-27 NOTE — Discharge Instructions (Signed)
Add tylenol to your pain management plan. Take one to two 500 mg tylenol along with one to two 200 mg ibuprofen every 8 hours. Continue ice therapy for another 24 hours. If no improvement in symptoms over the next week, please contact [facility] for potential follow-up with orthopedics.

## 2017-11-27 NOTE — ED Notes (Signed)
See provider assessment 

## 2017-11-27 NOTE — ED Provider Notes (Signed)
[facility] EMERGENCY DEPARTMENT Provider Note   CSN: [id number] Arrival date & time: 11/27/17  1529     History   Chief Complaint Chief Complaint  Patient presents with  . Foot Pain    HPI Rebecca Joseph is a 42 y.o. female.  Patient seen on 11/25/17 at [facility]. During that visit, reported that she twisted her left ankle and foot on Monday. Outpatient xray obtained. Patient reports continuing pain and inability to bear weight. She has been unsuccessful in obtaining the results of her xray.  The history is provided by the patient and medical records.  Foot Pain  This is a new problem. The current episode started more than 2 days ago. The problem occurs constantly. The problem has not changed since onset.The symptoms are aggravated by walking. She has tried a cold compress (ibuprofen) for the symptoms. The treatment provided no relief.    Past Medical History:  Diagnosis Date  . Anemia   . Gout     Patient Active Problem List   Diagnosis Date Noted  . Adenomyosis 05/30/2017  . ANEMIA 04/29/2008  . OBESITY 04/27/2008  . TOBACCO ABUSE 02/18/2008    Past Surgical History:  Procedure Laterality Date  . CESAREAN SECTION    . TUBAL LIGATION       OB History    Gravida  4   Para  3   Term  3   Preterm      AB  1   Living  3     SAB  1   TAB      Ectopic      Multiple      Live Births               Home Medications    Prior to Admission medications   Medication Sig Start Date End Date Taking? Authorizing Provider  colchicine 0.6 MG tablet TAKE 1 TABLET BY MOUTH 2 TIMES DAILY AS NEEDED 08/12/17   Ladell Pier, MD  ferrous sulfate 325 (65 FE) MG tablet Take 1 tablet (325 mg total) by mouth 3 (three) times daily with meals. 03/13/17   Alfonse Spruce, FNP  gemfibrozil (LOPID) 600 MG tablet Take 1 tablet (600 mg total) by mouth 2 (two) times daily before a meal. 07/20/17 10/18/17  [name], NP    hydrochlorothiazide (HYDRODIURIL) 25 MG tablet Take 1 tablet (25 mg total) by mouth daily. 10/20/17   [name], NP  metoprolol succinate (TOPROL-XL) 25 MG 24 hr tablet Take 0.5 tablets (12.5 mg total) by mouth daily. 11/25/17 02/23/18  [name], NP  Vitamin D, Ergocalciferol, (DRISDOL) 50000 units CAPS capsule Take 1 capsule (50,000 Units total) by mouth every 7 (seven) days. 10/20/17   [name], NP    Family History Family History  Problem Relation Age of Onset  . Diabetes Father   . Hypertension Father   . Heart disease Father        CHF  . Gout Father   . Hypertension Mother   . Breast cancer Maternal Aunt   . Cancer Maternal Aunt   . Breast cancer Cousin   . Cancer Cousin        Maternal Cousin died from breast cancer    Social History Social History   Tobacco Use  . Smoking status: Former Research scientist (life sciences)  . Smokeless tobacco: Never Used  Substance Use Topics  . Alcohol use: Yes    Comment:  occasionally  . Drug use: No     Allergies   Naproxen   Review of Systems Review of Systems   Physical Exam Updated Vital Signs BP 123/82   Pulse (!) 111   Temp 98.8 F (37.1 C) (Oral)   Resp 16   Ht 5\' 5"  (1.651 m)   Wt 83 kg   LMP 10/06/2017   SpO2 100%   BMI 30.45 kg/m   Physical Exam  Constitutional: She is oriented to person, place, and time. She appears well-developed and well-nourished.  HENT:  Head: Atraumatic.  Eyes: Conjunctivae are normal.  Neck: Neck supple.  Cardiovascular: Normal rate and regular rhythm.  Pulmonary/Chest: Effort normal and breath sounds normal.  Abdominal: Soft. Bowel sounds are normal.  Musculoskeletal: She exhibits edema and tenderness.       Left ankle: She exhibits decreased range of motion and swelling. She exhibits no deformity and normal pulse. Tenderness. Medial malleolus tenderness found.       Feet:  Neurological: She is alert and oriented to person, place, and time.  Skin: Skin is warm and dry.   Psychiatric: She has a normal mood and affect.  Nursing note and vitals reviewed.    ED Treatments / Results  Labs (all labs ordered are listed, but only abnormal results are displayed) Labs Reviewed - No data to display  EKG None  Radiology No results found.  Procedures Procedures (including critical care time)  Medications Ordered in ED Medications - No data to display   Initial Impression / Assessment and Plan / ED Course  I have reviewed the triage vital signs and the nursing notes.  Pertinent labs & imaging results that were available during my care of the patient were reviewed by me and considered in my medical decision making (see chart for details).     Dg Ankle Complete Left  Result Date: 11/25/2017 CLINICAL DATA:  Ankle sprain, pain, swelling EXAM: LEFT ANKLE COMPLETE - 3+ VIEW COMPARISON:  None. FINDINGS: Mild spurring in the medial right ankle. Mild overlying soft tissue swelling. No fracture, subluxation or dislocation. IMPRESSION: No acute bony abnormality. Electronically Signed   By: Rolm Baptise M.D.   On: 11/25/2017 12:52   Patient X-Ray negative for obvious fracture or dislocation.  Pt advised to follow up with orthopedics. Patient given ankle splint and crutches while in ED, conservative therapy recommended and discussed. Patient will be discharged home & is agreeable with above plan. Returns precautions discussed. Pt appears safe for discharge.  Final Clinical Impressions(s) / ED Diagnoses   Final diagnoses:  Acute left ankle pain    ED Discharge Orders    None       Etta Quill, NP 11/28/17 0149    Valarie Merino, MD 11/28/17 [phone]

## 2017-11-27 NOTE — Telephone Encounter (Signed)
Pt returned call, she has further questions to ask. Please follow up

## 2017-11-27 NOTE — ED Provider Notes (Signed)
[facility] EMERGENCY DEPARTMENT Provider Note   CSN: [id number] Arrival date & time: 11/27/17  1529     History   Chief Complaint Chief Complaint  Patient presents with  . Foot Pain    HPI Rebecca Joseph is a 42 y.o. female.  HPI  Past Medical History:  Diagnosis Date  . Anemia   . Gout     Patient Active Problem List   Diagnosis Date Noted  . Adenomyosis 05/30/2017  . ANEMIA 04/29/2008  . OBESITY 04/27/2008  . TOBACCO ABUSE 02/18/2008    Past Surgical History:  Procedure Laterality Date  . CESAREAN SECTION    . TUBAL LIGATION       OB History    Gravida  4   Para  3   Term  3   Preterm      AB  1   Living  3     SAB  1   TAB      Ectopic      Multiple      Live Births               Home Medications    Prior to Admission medications   Medication Sig Start Date End Date Taking? Authorizing Provider  colchicine 0.6 MG tablet TAKE 1 TABLET BY MOUTH 2 TIMES DAILY AS NEEDED 08/12/17   Ladell Pier, MD  ferrous sulfate 325 (65 FE) MG tablet Take 1 tablet (325 mg total) by mouth 3 (three) times daily with meals. 03/13/17   Alfonse Spruce, FNP  gemfibrozil (LOPID) 600 MG tablet Take 1 tablet (600 mg total) by mouth 2 (two) times daily before a meal. 07/20/17 10/18/17  [name], NP  hydrochlorothiazide (HYDRODIURIL) 25 MG tablet Take 1 tablet (25 mg total) by mouth daily. 10/20/17   [name], NP  metoprolol succinate (TOPROL-XL) 25 MG 24 hr tablet Take 0.5 tablets (12.5 mg total) by mouth daily. 11/25/17 02/23/18  [name], NP  Vitamin D, Ergocalciferol, (DRISDOL) 50000 units CAPS capsule Take 1 capsule (50,000 Units total) by mouth every 7 (seven) days. 10/20/17   [name], NP    Family History Family History  Problem Relation Age of Onset  . Diabetes Father   . Hypertension Father   . Heart disease Father        CHF  . Gout Father   . Hypertension Mother   . Breast cancer  Maternal Aunt   . Cancer Maternal Aunt   . Breast cancer Cousin   . Cancer Cousin        Maternal Cousin died from breast cancer    Social History Social History   Tobacco Use  . Smoking status: Former Research scientist (life sciences)  . Smokeless tobacco: Never Used  Substance Use Topics  . Alcohol use: Yes    Comment: occasionally  . Drug use: No     Allergies   Naproxen   Review of Systems Review of Systems  Musculoskeletal: Positive for arthralgias.  All other systems reviewed and are negative.    Physical Exam Updated Vital Signs BP 123/82   Pulse (!) 111   Temp 98.8 F (37.1 C) (Oral)   Resp 16   Ht 5\' 5"  (1.651 m)   Wt 83 kg   LMP 10/06/2017   SpO2 100%   BMI 30.45 kg/m   Physical Exam  Constitutional: She is oriented to person, place, and time. She appears well-developed and well-nourished. No distress.  HENT:  Head: Atraumatic.  Eyes: Conjunctivae are normal.  Musculoskeletal: She exhibits edema and tenderness.  Neurological: She is alert and oriented to person, place, and time. No sensory deficit.  Skin: Skin is warm and dry.  Psychiatric: She has a normal mood and affect.  Nursing note and vitals reviewed.    ED Treatments / Results  Labs (all labs ordered are listed, but only abnormal results are displayed) Labs Reviewed - No data to display  EKG None  Radiology    Dg Ankle Complete Left  Result Date: 11/25/2017 CLINICAL DATA:  Ankle sprain, pain, swelling EXAM: LEFT ANKLE COMPLETE - 3+ VIEW COMPARISON:  None. FINDINGS: Mild spurring in the medial right ankle. Mild overlying soft tissue swelling. No fracture, subluxation or dislocation. IMPRESSION: No acute bony abnormality. Electronically Signed   By: Rolm Baptise M.D.   On: 11/25/2017 12:52    Procedures Procedures (including critical care time)  Medications Ordered in ED Medications - No data to display   Initial Impression / Assessment and Plan / ED Course  I have reviewed the triage vital  signs and the nursing notes.  Pertinent labs & imaging results that were available during my care of the patient were reviewed by me and considered in my medical decision making (see chart for details).     Patient X-Ray negative for obvious fracture or dislocation.  Pt advised to follow up with orthopedics. Patient given ankle splint, crutches while in ED, conservative therapy recommended and discussed. Patient will be discharged home & is agreeable with above plan. Returns precautions discussed. Pt appears safe for discharge.  Final Clinical Impressions(s) / ED Diagnoses   Final diagnoses:  Acute left ankle pain    ED Discharge Orders    None       Etta Quill, NP 11/27/17 1751    Valarie Merino, MD 11/28/17 [phone]

## 2017-11-27 NOTE — Telephone Encounter (Signed)
Patient called requesting to speak to the nurse because of her ankle pain, patient requesting results of x-ray and advice on what to do because she would like to go back to work. Please follow up with patient.

## 2017-11-27 NOTE — Telephone Encounter (Signed)
Will route to PCP regarding Xray Results.  CMA attempt to call patient back, no answer and left a Vm.

## 2017-11-27 NOTE — ED Triage Notes (Signed)
Pt reports twisting her foot on Monday. Pt has been unable to bear weight on left foot since then. Pt reports 9/10 throbbing pain and minimal relief with ibuprofen. Pt unable to get in touch with doctor for xray results.

## 2017-11-28 NOTE — Telephone Encounter (Signed)
CMA called patient back. Patient stated she went out to buy a ankle sprint and she is feeling fine now.

## 2017-12-02 ENCOUNTER — Telehealth: Payer: Self-pay | Admitting: Nurse Practitioner

## 2017-12-02 NOTE — Telephone Encounter (Signed)
Patient called stating that she can not bare weight on her foot, she states she is still using crutches and would like to be referred to a foot specialist. Patient does not have oc.

## 2017-12-02 NOTE — Telephone Encounter (Signed)
CMA spoke to patient to inform her that due to she she uninsured she can establish with any foot specialist without needing PCP referral.   Patient understood.

## 2017-12-11 ENCOUNTER — Other Ambulatory Visit: Payer: Self-pay | Admitting: Internal Medicine

## 2017-12-11 ENCOUNTER — Other Ambulatory Visit: Payer: Self-pay | Admitting: Nurse Practitioner

## 2017-12-11 DIAGNOSIS — M109 Gout, unspecified: Secondary | ICD-10-CM

## 2017-12-11 MED ORDER — GEMFIBROZIL 600 MG PO TABS: 600.0000 mg | ORAL_TABLET | Freq: Two times a day (BID) | ORAL | 3 refills | Status: DC

## 2017-12-11 MED ORDER — COLCHICINE 0.6 MG PO TABS: ORAL_TABLET | ORAL | 0 refills | Status: DC

## 2017-12-11 NOTE — Telephone Encounter (Signed)
Refill Request.  

## 2017-12-12 MED FILL — GEMFIBROZIL 600 MG TAB: 600 | 30 days supply | Qty: 60 | Fill #0

## 2017-12-12 MED FILL — !COLCRYS 0.6 MG TABLET: 0.6 MG | 10 days supply | Qty: 20 | Fill #0

## 2017-12-16 MED FILL — HYDROCHLOROTHIAZIDE 25 MG T: 25 | 30 days supply | Qty: 30 | Fill #2

## 2017-12-16 MED FILL — VIT D2 1.25 MG (50,000 UNIT: 1.25 MG | 28 days supply | Qty: 4 | Fill #4

## 2017-12-18 ENCOUNTER — Encounter (HOSPITAL_COMMUNITY): Payer: Self-pay

## 2017-12-18 ENCOUNTER — Ambulatory Visit
Admission: RE | Admit: 2017-12-18 | Discharge: 2017-12-18 | Disposition: A | Discharge status: Home / Self Care | Payer: No Typology Code available for payment source | Source: Ambulatory Visit | Attending: Obstetrics and Gynecology | Admitting: Obstetrics and Gynecology

## 2017-12-18 ENCOUNTER — Ambulatory Visit (HOSPITAL_COMMUNITY)
Admission: RE | Admit: 2017-12-18 | Discharge: 2017-12-18 | Disposition: A | Discharge status: Home / Self Care | Payer: Self-pay | Source: Ambulatory Visit | Attending: Obstetrics and Gynecology | Admitting: Obstetrics and Gynecology

## 2017-12-18 VITALS — BP 124/80 | Ht 65.0 in | Wt 185.6 lb

## 2017-12-18 DIAGNOSIS — Z1231 Encounter for screening mammogram for malignant neoplasm of breast: Secondary | ICD-10-CM

## 2017-12-18 DIAGNOSIS — Z1239 Encounter for other screening for malignant neoplasm of breast: Secondary | ICD-10-CM

## 2017-12-18 HISTORY — DX: Hyperlipidemia, unspecified: E78.5

## 2017-12-18 HISTORY — DX: Essential (primary) hypertension: I10

## 2017-12-18 NOTE — Patient Instructions (Signed)
Explained breast self awareness with Rebecca Joseph. Patient did not need a Pap smear today due to last Pap smear and HPV typing was 05/28/2016. Let her know [facility] will cover Pap smears and HPV typing every 5 years unless has a history of abnormal Pap smears. Referred patient to the [facility] for a screening mammogram. Appointment scheduled for Thursday, December 18, 2017 at 1600. Let patient know the [facility] will follow up with her within the next couple weeks with results of mammogram by letter or phone. Rebecca Joseph verbalized understanding.  Rebecca Joseph, Arvil Chaco, RN 3:42 PM

## 2017-12-18 NOTE — Progress Notes (Signed)
No complaints today.   Pap Smear: Pap smear not completed today. Last Pap smear was 05/28/2016 at [facility] and normal with negative HPV. Per patient has no history of an abnormal Pap smear. Last two Pap smear results are in Epic.  Physical exam: Breasts Left breast slightly larger than right breast that per patient she has not noticed any changes. No skin abnormalities bilateral breasts. No nipple retraction bilateral breasts. No nipple discharge bilateral breasts. No lymphadenopathy. No lumps palpated bilateral breasts. No complaints of pain or tenderness on exam. Referred patient to the [facility] for a screening mammogram. Appointment scheduled for Thursday, December 18, 2017 at 1600.        Pelvic/Bimanual No Pap smear completed today since last Pap smear and HPV typing was 05/28/2016. Pap smear not indicated per [facility] guidelines.   Smoking History: Patient is a former smoker that quit over 20 years ago.   Patient Navigation: Patient education provided. Access to services provided for patient through [facility] program.   Breast and Cervical Cancer Risk Assessment: Patient has a family history of a maternal aunt having breast cancer. Patient has no known genetic mutations or history of radiation treatment to the chest before age 72. Patient has no history of cervical dysplasia, immunocompromised, or DES exposure in-utero.  Risk Assessment    Risk Scores      12/18/2017   Last edited by: Armond Hang, LPN   5-year risk: 0.5 %   Lifetime risk: 7.4 %

## 2017-12-19 ENCOUNTER — Encounter (HOSPITAL_COMMUNITY): Payer: Self-pay | Admitting: *Deleted

## 2017-12-22 ENCOUNTER — Encounter: Payer: Self-pay | Admitting: Nurse Practitioner

## 2017-12-22 ENCOUNTER — Ambulatory Visit: Payer: Self-pay | Attending: Nurse Practitioner | Admitting: Nurse Practitioner

## 2017-12-22 VITALS — BP 131/91 | HR 95 | Temp 99.0°F | Ht 65.0 in | Wt 184.8 lb

## 2017-12-22 DIAGNOSIS — M25572 Pain in left ankle and joints of left foot: Secondary | ICD-10-CM

## 2017-12-22 DIAGNOSIS — Z0000 Encounter for general adult medical examination without abnormal findings: Secondary | ICD-10-CM

## 2017-12-22 DIAGNOSIS — Z79899 Other long term (current) drug therapy: Secondary | ICD-10-CM | POA: Insufficient documentation

## 2017-12-22 DIAGNOSIS — E785 Hyperlipidemia, unspecified: Secondary | ICD-10-CM | POA: Insufficient documentation

## 2017-12-22 DIAGNOSIS — M109 Gout, unspecified: Secondary | ICD-10-CM | POA: Insufficient documentation

## 2017-12-22 DIAGNOSIS — R000 Tachycardia, unspecified: Secondary | ICD-10-CM

## 2017-12-22 DIAGNOSIS — I10 Essential (primary) hypertension: Secondary | ICD-10-CM

## 2017-12-22 MED ORDER — HYDROCHLOROTHIAZIDE 25 MG PO TABS: 25.0000 mg | ORAL_TABLET | Freq: Every day | ORAL | 3 refills | Status: DC

## 2017-12-22 MED ORDER — METOPROLOL SUCCINATE ER 25 MG PO TB24: 12.5000 mg | ORAL_TABLET | Freq: Every day | ORAL | 1 refills | Status: DC

## 2017-12-22 NOTE — Progress Notes (Signed)
Assessment & Plan:  [name] was seen today for annual exam.  Diagnoses and all orders for this visit:  Well woman exam without gynecological exam  Tachycardia -     metoprolol succinate (TOPROL-XL) 25 MG 24 hr tablet; Take 0.5 tablets (12.5 mg total) by mouth daily. CONTROLLED. Chronic.   Essential hypertension She endorses medication compliance taking HCTZ daily. Denies chest pain, shortness of breath, palpitations, lightheadedness, dizziness, headaches or BLE edema.  -     hydrochlorothiazide (HYDRODIURIL) 25 MG tablet; Take 1 tablet (25 mg total) by mouth daily. Continue all antihypertensives as prescribed.  Remember to bring in your blood pressure log with you for your follow up appointment.  DASH/Mediterranean Diets are healthier choices for HTN.  BP Readings from Last 3 Encounters:  12/22/17 (!) 131/91  12/18/17 124/80  11/27/17 129/84   Acute left ankle pain She is currently being followed by orthopedics. Ultrasound is pending. -     CBC -     Uric Acid    Patient has been counseled on age-appropriate routine health concerns for screening and prevention. These are reviewed and up-to-date. Referrals have been placed accordingly. Immunizations are up-to-date or declined.    Subjective:   Chief Complaint  Patient presents with  . Annual Exam    Pt. is here for a physical.    HPI Rebecca Joseph 41 y.o. female presents to office today for annual wellness exam. She is not due for PAP.    Review of Systems  Constitutional: Negative.  Negative for chills, fever, malaise/fatigue and weight loss.  HENT: Negative.  Negative for congestion, hearing loss, nosebleeds, sinus pain and sore throat.   Eyes: Negative.  Negative for blurred vision, double vision, photophobia and pain.  Respiratory: Negative.  Negative for cough, sputum production, shortness of breath and wheezing.   Cardiovascular: Negative.  Negative for chest pain, palpitations and leg swelling.    Gastrointestinal: Negative.  Negative for abdominal pain, constipation, diarrhea, heartburn, nausea and vomiting.  Genitourinary: Negative.   Musculoskeletal: Positive for joint pain (left ankle). Negative for myalgias.  Skin: Negative.  Negative for rash.  Neurological: Negative.  Negative for dizziness, tremors, speech change, focal weakness, seizures and headaches.  Endo/Heme/Allergies: Negative.  Negative for environmental allergies.  Psychiatric/Behavioral: Negative.  Negative for depression and suicidal ideas. The patient is not nervous/anxious and does not have insomnia.     Past Medical History:  Diagnosis Date  . Anemia   . Gout   . Hyperlipidemia   . Hypertension     Past Surgical History:  Procedure Laterality Date  . CESAREAN SECTION    . TUBAL LIGATION      Family History  Problem Relation Age of Onset  . Diabetes Father   . Hypertension Father   . Heart disease Father        CHF  . Gout Father   . Hypertension Mother   . Breast cancer Maternal Aunt   . Cancer Maternal Aunt   . Breast cancer Cousin   . Cancer Cousin        Maternal Cousin died from breast cancer    Social History Reviewed with no changes to be made today.   Outpatient Medications Prior to Visit  Medication Sig Dispense Refill  . ferrous sulfate 325 (65 FE) MG tablet Take 1 tablet (325 mg total) by mouth 3 (three) times daily with meals. 90 tablet 3  . gemfibrozil (LOPID) 600 MG tablet Take 1 tablet (600 mg total) by  mouth 2 (two) times daily before a meal. 180 tablet 3  . hydrochlorothiazide (HYDRODIURIL) 25 MG tablet Take 1 tablet (25 mg total) by mouth daily. 90 tablet 3  . metoprolol succinate (TOPROL-XL) 25 MG 24 hr tablet Take 0.5 tablets (12.5 mg total) by mouth daily. 45 tablet 0  . colchicine 0.6 MG tablet TAKE 1 TABLET BY MOUTH 2 TIMES DAILY AS NEEDED (Patient not taking: Reported on 12/18/2017) 20 tablet 0  . Vitamin D, Ergocalciferol, (DRISDOL) 50000 units CAPS capsule Take 1  capsule (50,000 Units total) by mouth every 7 (seven) days. (Patient not taking: Reported on 12/22/2017) 12 capsule 1   No facility-administered medications prior to visit.     Allergies  Allergen Reactions  . Naproxen Shortness Of Breath       Objective:    BP (!) 131/91 (BP Location: Left Arm, Patient Position: Sitting, Cuff Size: Large)   Pulse 95   Temp 99 F (37.2 C) (Oral)   Ht 5\' 5"  (1.651 m)   Wt 184 lb 12.8 oz (83.8 kg)   LMP 12/01/2017 (Approximate)   SpO2 98%   BMI 30.75 kg/m  Wt Readings from Last 3 Encounters:  12/22/17 184 lb 12.8 oz (83.8 kg)  12/18/17 185 lb 9.6 oz (84.2 kg)  11/27/17 183 lb (83 kg)    Physical Exam  Constitutional: She is oriented to person, place, and time. She appears well-developed and well-nourished.  HENT:  Head: Normocephalic and atraumatic.  Right Ear: External ear normal.  Left Ear: External ear normal.  Nose: Nose normal.  Mouth/Throat: Oropharynx is clear and moist. No oropharyngeal exudate.  Eyes: Pupils are equal, round, and reactive to light. Conjunctivae and EOM are normal. Right eye exhibits no discharge. No scleral icterus.  Neck: Normal range of motion. Neck supple. No tracheal deviation present. No thyromegaly present.  Cardiovascular: Normal rate, regular rhythm, normal heart sounds and intact distal pulses. Exam reveals no friction rub.  No murmur heard. Pulmonary/Chest: Effort normal and breath sounds normal. No accessory muscle usage. No respiratory distress. She has no decreased breath sounds. She has no wheezes. She has no rhonchi. She has no rales. She exhibits no tenderness. Right breast exhibits inverted nipple. Right breast exhibits no mass, no nipple discharge, no skin change and no tenderness. Left breast exhibits inverted nipple. Left breast exhibits no mass, no nipple discharge, no skin change and no tenderness. Breasts are symmetrical.  Abdominal: Soft. Bowel sounds are normal. She exhibits no distension and  no mass. There is no tenderness. There is no rebound and no guarding.  Musculoskeletal: Normal range of motion. She exhibits no edema, tenderness or deformity.       Left ankle: She exhibits swelling.       Feet:  Lymphadenopathy:    She has no cervical adenopathy.  Neurological: She is alert and oriented to person, place, and time. She has normal reflexes. No cranial nerve deficit. Coordination normal.  Skin: Skin is warm and dry. No erythema.  Psychiatric: She has a normal mood and affect. Her speech is normal and behavior is normal. Judgment and thought content normal.       Patient has been counseled extensively about nutrition and exercise as well as the importance of adherence with medications and regular follow-up. The patient was given clear instructions to go to ER or return to medical center if symptoms don't improve, worsen or new problems develop. The patient verbalized understanding.   Follow-up: Return in about 3 months (around 03/23/2018)  for HTN.   [name], FNP-BC [facility], [place]   12/22/2017, 12:42 PM

## 2017-12-23 LAB — CBC
Hematocrit: 30.3 % — ABNORMAL LOW (ref 34.0–46.6)
Hemoglobin: 9.5 g/dL — ABNORMAL LOW (ref 11.1–15.9)
MCH: 21.4 pg — AB (ref 26.6–33.0)
MCHC: 31.4 g/dL — AB (ref 31.5–35.7)
MCV: 68 fL — AB (ref 79–97)
PLATELETS: 498 10*3/uL — AB (ref 150–450)
RBC: 4.44 x10E6/uL (ref 3.77–5.28)
RDW: 18.2 % — AB (ref 12.3–15.4)
WBC: 7.3 10*3/uL (ref 3.4–10.8)

## 2017-12-23 LAB — URIC ACID: Uric Acid: 10.2 mg/dL — ABNORMAL HIGH (ref 2.5–7.1)

## 2017-12-26 ENCOUNTER — Other Ambulatory Visit: Payer: Self-pay | Admitting: Nurse Practitioner

## 2017-12-26 DIAGNOSIS — D500 Iron deficiency anemia secondary to blood loss (chronic): Secondary | ICD-10-CM

## 2017-12-26 MED ORDER — FERROUS SULFATE 325 (65 FE) MG PO TABS: 325.0000 mg | ORAL_TABLET | Freq: Two times a day (BID) | ORAL | 3 refills | Status: DC

## 2017-12-26 MED ORDER — ALLOPURINOL 100 MG PO TABS: 100.0000 mg | ORAL_TABLET | Freq: Every day | ORAL | 6 refills | Status: DC

## 2017-12-26 MED FILL — FERROUS SULFATE 325 MG TAB: 325 (65 FE) | 30 days supply | Qty: 60 | Fill #0

## 2017-12-26 MED FILL — ALLOPURINOL 100 MG TABLET: 100 | 30 days supply | Qty: 30 | Fill #0

## 2017-12-29 ENCOUNTER — Encounter: Payer: Self-pay | Admitting: Podiatry

## 2017-12-29 ENCOUNTER — Other Ambulatory Visit: Payer: Self-pay | Admitting: Orthopedic Surgery

## 2017-12-29 ENCOUNTER — Ambulatory Visit (INDEPENDENT_AMBULATORY_CARE_PROVIDER_SITE_OTHER): Payer: Self-pay | Admitting: Podiatry

## 2017-12-29 VITALS — BP 142/94 | HR 122

## 2017-12-29 DIAGNOSIS — M76829 Posterior tibial tendinitis, unspecified leg: Secondary | ICD-10-CM

## 2017-12-29 DIAGNOSIS — M76822 Posterior tibial tendinitis, left leg: Secondary | ICD-10-CM

## 2017-12-29 MED ORDER — METHYLPREDNISOLONE 4 MG PO TBPK: ORAL_TABLET | ORAL | 0 refills | Status: DC

## 2017-12-29 MED FILL — METHYLPREDNISOLONE 4 MG TAB: 4 | 6 days supply | Qty: 21 | Fill #0

## 2017-12-30 ENCOUNTER — Ambulatory Visit: Payer: Self-pay | Attending: Nurse Practitioner

## 2018-01-01 NOTE — Progress Notes (Signed)
Subjective:   Patient ID: Brain Hilts, female   DOB: 42 y.o.   MRN: [id number]   HPI 42 year old female presents the office with concerns of left ankle pain which is been ongoing for about 1 month.  She states that she has been able to do a lot of walking as it causes pain when she tries to walk.  She states that she felt a pop about a month ago to her ankle she points on the medial aspect of the ankle.  She did follow-up with orthopedics and they were in order an ultrasound but she was unable to get this done.  She presents today wearing a regular shoe.  She has been trying some Tylenol and ibuprofen without any significant improvement.  She has no other concerns today.   Review of Systems  All other systems reviewed and are negative.  Past Medical History:  Diagnosis Date  . Anemia   . Gout   . Hyperlipidemia   . Hypertension     Past Surgical History:  Procedure Laterality Date  . CESAREAN SECTION    . TUBAL LIGATION       Current Outpatient Medications:  .  allopurinol (ZYLOPRIM) 100 MG tablet, Take 1 tablet (100 mg total) by mouth daily., Disp: 30 tablet, Rfl: 6 .  colchicine 0.6 MG tablet, TAKE 1 TABLET BY MOUTH 2 TIMES DAILY AS NEEDED, Disp: 20 tablet, Rfl: 0 .  ferrous sulfate 325 (65 FE) MG tablet, Take 1 tablet (325 mg total) by mouth 2 (two) times daily with a meal., Disp: 60 tablet, Rfl: 3 .  gemfibrozil (LOPID) 600 MG tablet, Take 1 tablet (600 mg total) by mouth 2 (two) times daily before a meal., Disp: 180 tablet, Rfl: 3 .  hydrochlorothiazide (HYDRODIURIL) 25 MG tablet, Take 1 tablet (25 mg total) by mouth daily., Disp: 90 tablet, Rfl: 3 .  metoprolol succinate (TOPROL-XL) 25 MG 24 hr tablet, Take 0.5 tablets (12.5 mg total) by mouth daily., Disp: 45 tablet, Rfl: 1 .  Vitamin D, Ergocalciferol, (DRISDOL) 50000 units CAPS capsule, Take 1 capsule (50,000 Units total) by mouth every 7 (seven) days., Disp: 12 capsule, Rfl: 1 .  methylPREDNISolone (MEDROL DOSEPAK)  4 MG TBPK tablet, Take as directed, Disp: 21 tablet, Rfl: 0  Allergies  Allergen Reactions  . Naproxen Shortness Of Breath        Objective:  Physical Exam  General: AAO x3, NAD  Dermatological: Skin is warm, dry and supple bilateral. Nails x 10 are well manicured; remaining integument appears unremarkable at this time. There are no open sores, no preulcerative lesions, no rash or signs of infection present.  Vascular: Dorsalis Pedis artery and Posterior Tibial artery pedal pulses are 2/4 bilateral with immedate capillary fill time. Pedal hair growth present. No varicosities and no lower extremity edema present bilateral. There is no pain with calf compression, swelling, warmth, erythema.   Neruologic: Grossly intact via light touch bilateral. Vibratory intact via tuning fork bilateral. Protective threshold with Semmes Wienstein monofilament intact to all pedal sites bilateral. Negative tinel sign.   Musculoskeletal: There is evidence of flatfoot deformity bilaterally.  There is tenderness on the distal portion of the posterior tibial tendon just posterior and inferior to the medial malleolus on the insertion into the navicular tuberosity.  She is able to do a single and double heel rise.  On the single heel rise however it is painful for her to do.  She has adequate strength.  There is minimal edema  to the medial ankle there is no erythema or warmth.  There is no area pinpoint tenderness.  Muscular strength 5/5 in all groups tested bilateral.  Gait: Unassisted, Nonantalgic.       Assessment:   Posterior tibial tendon dysfunction, possible partial tear    Plan:  -Treatment options discussed including all alternatives, risks, and complications -Etiology of symptoms were discussed -I independently reviewed the x-rays.  On clinical exam does not appear that there is a complete rupture however concern for partial tear of the posterior tibial tendon.  At this point I want to immobilize her  in a cam boot was dispensed.  Continue ice elevate.  Prescribed Medrol Dosepak.  Limit activity.  I will see her back in the next couple weeks to see how she is doing and she is continued to have symptoms we will try again for the ultrasound. She is in agreement to this and has no further questions.  Trula Slade DPM

## 2018-01-11 ENCOUNTER — Other Ambulatory Visit: Payer: Self-pay | Admitting: Nurse Practitioner

## 2018-01-11 DIAGNOSIS — M109 Gout, unspecified: Secondary | ICD-10-CM

## 2018-01-12 ENCOUNTER — Other Ambulatory Visit: Payer: Self-pay | Admitting: Nurse Practitioner

## 2018-01-12 DIAGNOSIS — M109 Gout, unspecified: Secondary | ICD-10-CM

## 2018-01-12 MED ORDER — COLCHICINE 0.6 MG PO TABS: ORAL_TABLET | ORAL | 0 refills | Status: DC

## 2018-01-12 MED FILL — !COLCRYS 0.6 MG TABLET: 0.6 MG | 10 days supply | Qty: 20 | Fill #0

## 2018-01-15 MED FILL — HYDROCHLOROTHIAZIDE 25 MG T: 25 | 30 days supply | Qty: 30 | Fill #3

## 2018-01-20 MED FILL — GEMFIBROZIL 600 MG TAB: 600 | 30 days supply | Qty: 60 | Fill #4

## 2018-01-20 MED FILL — ALLOPURINOL 100 MG TABLET: 100 | 30 days supply | Qty: 30 | Fill #1

## 2018-01-23 ENCOUNTER — Ambulatory Visit: Payer: Self-pay | Admitting: Podiatry

## 2018-01-25 ENCOUNTER — Encounter: Payer: Self-pay | Admitting: Nurse Practitioner

## 2018-02-02 ENCOUNTER — Ambulatory Visit: Payer: Self-pay | Admitting: Podiatry

## 2018-02-02 ENCOUNTER — Other Ambulatory Visit: Payer: Self-pay | Admitting: Podiatry

## 2018-02-02 ENCOUNTER — Ambulatory Visit: Payer: Self-pay | Attending: Nurse Practitioner

## 2018-02-02 DIAGNOSIS — M109 Gout, unspecified: Secondary | ICD-10-CM

## 2018-02-02 DIAGNOSIS — M76822 Posterior tibial tendinitis, left leg: Secondary | ICD-10-CM

## 2018-02-02 MED ORDER — COLCHICINE 0.6 MG PO TABS: 0.6000 mg | ORAL_TABLET | Freq: Every day | ORAL | 2 refills | Status: DC

## 2018-02-02 MED FILL — !COLCRYS 0.6 MG TABLET: 0.6 MG | 15 days supply | Qty: 15 | Fill #0

## 2018-02-02 NOTE — Patient Instructions (Signed)
Colchicine; Probenecid oral tablet What is this medicine? COLCHICINE; PROBENECID (KOL chi seen; proe BEN e sid) is for joint pain and swelling due to gouty arthritis. This medicine may be used for other purposes; ask your health care provider or pharmacist if you have questions. What should I tell my health care provider before I take this medicine? They need to know if you have any of these conditions: -anemia -blood disorders like leukemia or lymphoma -having an acute gouty attack -heart disease -immune system problems -intestinal disease -kidney disease, stones -liver disease -low platelet counts -stomach problems -an unusual or allergic reaction to colchicine, probenecid, other medicines, lactose, foods, dyes, or preservatives -pregnant or trying to get pregnant -breast-feeding How should I use this medicine? Take this medicine by mouth with a full glass of water. Follow the directions on the prescription label. Take your medicine at regular intervals. Do not take your medicine more often than directed. Do not stop taking except on your doctor's advice. Talk to your pediatrician regarding the use of this medicine in children. Special care may be needed. Overdosage: If you think you have taken too much of this medicine contact a poison control center or emergency room at once. NOTE: This medicine is only for you. Do not share this medicine with others. What if I miss a dose? If you miss a dose, take it as soon as you can. If it is almost time for your next dose, take only that dose. Do not take double or extra doses. What may interact with this medicine? Do not take this medicine with any of the following medications: -aspirin and aspirin-like drugs -certain medicines for fungal infections like itraconazole or ketoconazole -clarithromycin -erythromycin -ketorolac -methotrexate -topiramate This medicine may also interact with the following  medications: -acetaminophen -alcohol -antibiotics including penicillins, sulfonamides -antiviral medicines such as acyclovir, famciclovir, ganciclovir -cyclosporine -epinephrine -lorazepam -meclofenamate -medicines for diabetes -medicines for sleep during surgery -methenamine -methotrexate -NSAIDs, medicines for pain and inflammation, like ibuprofen or naproxen -pyrazinamide -rifampin -sodium bicarbonate This list may not describe all possible interactions. Give your health care provider a list of all the medicines, herbs, non-prescription drugs, or dietary supplements you use. Also tell them if you smoke, drink alcohol, or use illegal drugs. Some items may interact with your medicine. What should I watch for while using this medicine? Visit your doctor or health care professional for regular checks on your progress. You may need periodic blood checks. Aspirin and non-steroidal antiinflammatory drugs like ibuprofen can make this medicine less effective. Do not treat yourself for headaches or pain. Ask your doctor or health care professional for advice. Alcohol can increase the chance of getting stomach problems and gout attacks. Do not drink alcohol. You may need to be on a special diet while taking this medicine. Check with your doctor. Also, ask how many glasses of fluid you need to drink a day. You must not get dehydrated. This medicine can interfere with some urine glucose tests. If you use such tests talk with your health care professional. What side effects may I notice from receiving this medicine? Side effects that you should report to your doctor or health care professional as soon as possible: -allergic reactions like skin rash, itching or hives, swelling of the face, lips, or tongue -back or kidney pain -breathing problems -dark urine -fever, chills, or sore throat -numbness or tingling in hands or feet -trouble passing urine or change in the amount of urine -unusual  bleeding or bruising -unusually weak  or tired Side effects that usually do not require medical attention (report to your doctor or health care professional if they continue or are bothersome): -diarrhea -hair loss -headache -loss of appetite -nausea, vomiting -stomach upset This list may not describe all possible side effects. Call your doctor for medical advice about side effects. You may report side effects to FDA at 1-800-FDA-1088. Where should I keep my medicine? Keep out of the reach of children. Store at room temperature between 20 and 25 degrees C (68 and 77 degrees F). Keep container tightly closed. Throw away any unused medicine after the expiration date. NOTE: This sheet is a summary. It may not cover all possible information. If you have questions about this medicine, talk to your doctor, pharmacist, or health care provider.  2018 Elsevier/Gold Standard (2012-09-21 16:50:46)   Gout Gout is painful swelling that can happen in some of your joints. Gout is a type of arthritis. This condition is caused by having too much uric acid in your body. Uric acid is a chemical that is made when your body breaks down substances called purines. If your body has too much uric acid, sharp crystals can form and build up in your joints. This causes pain and swelling. Gout attacks can happen quickly and be very painful (acute gout). Over time, the attacks can affect more joints and happen more often (chronic gout). Follow these instructions at home: During a Gout Attack  If directed, put ice on the painful area: ? Put ice in a plastic bag. ? Place a towel between your skin and the bag. ? Leave the ice on for 20 minutes, 2-3 times a day.  Rest the joint as much as possible. If the joint is in your leg, you may be given crutches to use.  Raise (elevate) the painful joint above the level of your heart as often as you can.  Drink enough fluids to keep your pee (urine) clear or pale  yellow.  Take over-the-counter and prescription medicines only as told by your doctor.  Do not drive or use heavy machinery while taking prescription pain medicine.  Follow instructions from your doctor about what you can or cannot eat and drink.  Return to your normal activities as told by your doctor. Ask your doctor what activities are safe for you. Avoiding Future Gout Attacks  Follow a low-purine diet as told by a specialist (dietitian) or your doctor. Avoid foods and drinks that have a lot of purines, such as: ? Liver. ? Kidney. ? Anchovies. ? Asparagus. ? Herring. ? Mushrooms ? Mussels. ? Beer.  Limit alcohol intake to no more than 1 drink a day for nonpregnant women and 2 drinks a day for men. One drink equals 12 oz of beer, 5 oz of wine, or 1 oz of hard liquor.  Stay at a healthy weight or lose weight if you are overweight. If you want to lose weight, talk with your doctor. It is important that you do not lose weight too fast.  Start or continue an exercise plan as told by your doctor.  Drink enough fluids to keep your pee clear or pale yellow.  Take over-the-counter and prescription medicines only as told by your doctor.  Keep all follow-up visits as told by your doctor. This is important. Contact a doctor if:  You have another gout attack.  You still have symptoms of a gout attack after10 days of treatment.  You have problems (side effects) because of your medicines.  You  have chills or a fever.  You have burning pain when you pee (urinate).  You have pain in your lower back or belly. Get help right away if:  You have very bad pain.  Your pain cannot be controlled.  You cannot pee. This information is not intended to replace advice given to you by your health care provider. Make sure you discuss any questions you have with your health care provider. Document Released: 01/02/2008 Document Revised: 08/31/2015 Document Reviewed: 01/05/2015 Elsevier  Interactive Patient Education  [employer].

## 2018-02-02 NOTE — Progress Notes (Signed)
Subjective: 42 year old female presents the office today for follow-up evaluation of left ankle pain, PTTD.  She states that her left ankle is doing much better she came out of the boot she is back to her regular shoe without any pain.  However she has new concerns of right ankle pain.  She states that she has a gout and her last uric acid level was 10.2.  She states that this she had a flare on Friday and she had colchicine at home and she went ahead and took 2 tablets on Friday and Saturday but she ran out.  The pain is better since starting colchicine but overall does continue.  She states that her father also had gout. Denies any systemic complaints such as fevers, chills, nausea, vomiting. No acute changes since last appointment, and no other complaints at this time.   Objective: AAO x3, NAD DP/PT pulses palpable bilaterally, CRT less than 3 seconds At this time there is no tenderness palpation of the left ankle.  Posterior tibial tendon appears to be intact and there is no tenderness along the flexor, extensor tendons.  On the right ankle there is mild tenderness in the anterior ankle joint line there is faint edema and erythema.  This appears to be resolving gout she states is much better since starting colchicine.  No recent injury. No open lesions or pre-ulcerative lesions.  No pain with calf compression, swelling, warmth, erythema  Assessment: Posterior tibial tendon dysfunction left side with resolution, right ankle pain/gout  Plan: -All treatment options discussed with the patient including all alternatives, risks, complications.  -In regards to her left ankle she is doing much better and she is having no problems.  Discussed wearing supportive shoes to help take pressure off the area. -Regards to the gout I did prescribe colchicine for her to take next couple days.  She is on allopurinol already.  I would recheck a uric acid level in the order was given for today.  Discussed possible  rheumatology consult. -Patient encouraged to call the office with any questions, concerns, change in symptoms.   Trula Slade DPM

## 2018-02-03 LAB — URIC ACID: Uric Acid: 5.2 mg/dL (ref 2.5–7.1)

## 2018-02-09 ENCOUNTER — Telehealth: Payer: Self-pay | Admitting: Nurse Practitioner

## 2018-02-09 ENCOUNTER — Encounter: Payer: Self-pay | Admitting: Nurse Practitioner

## 2018-02-09 ENCOUNTER — Telehealth: Payer: Self-pay | Admitting: Podiatry

## 2018-02-09 NOTE — Telephone Encounter (Signed)
CMA spoke to patient and inform her to call her Podiatry for the Uric Acid level lab results.  Pt. Understood.  Patient stated she is taking her Allopurinol 2x a day and request if she can get a refill.

## 2018-02-09 NOTE — Telephone Encounter (Signed)
Patient was calling concerning Uric Acid results. She had the test taken at her Primary Office, just wondering if you received the results.

## 2018-02-09 NOTE — Telephone Encounter (Signed)
[facility] medication refill

## 2018-02-09 NOTE — Telephone Encounter (Signed)
Patient called requesting her Uric Acid results. Please f/u

## 2018-02-09 NOTE — Telephone Encounter (Signed)
I informed pt of the normal 5.2 Uric Acid results as normal. Pt states she will inform her PCP.

## 2018-02-11 ENCOUNTER — Other Ambulatory Visit: Payer: Self-pay | Admitting: Nurse Practitioner

## 2018-02-11 MED ORDER — ALLOPURINOL 100 MG PO TABS: 200.0000 mg | ORAL_TABLET | Freq: Every day | ORAL | 1 refills | Status: DC

## 2018-02-12 MED FILL — ?ALLOPURINOL 100MG TABLET: 100 | 30 days supply | Qty: 60 | Fill #0

## 2018-02-13 ENCOUNTER — Encounter: Payer: Self-pay | Admitting: Nurse Practitioner

## 2018-02-13 ENCOUNTER — Ambulatory Visit: Payer: Self-pay | Attending: Nurse Practitioner | Admitting: Nurse Practitioner

## 2018-02-13 VITALS — BP 136/87 | HR 104 | Temp 98.2°F | Ht 65.0 in | Wt 191.0 lb

## 2018-02-13 DIAGNOSIS — E559 Vitamin D deficiency, unspecified: Secondary | ICD-10-CM | POA: Insufficient documentation

## 2018-02-13 DIAGNOSIS — E785 Hyperlipidemia, unspecified: Secondary | ICD-10-CM | POA: Insufficient documentation

## 2018-02-13 DIAGNOSIS — Z79899 Other long term (current) drug therapy: Secondary | ICD-10-CM | POA: Insufficient documentation

## 2018-02-13 DIAGNOSIS — I10 Essential (primary) hypertension: Secondary | ICD-10-CM | POA: Insufficient documentation

## 2018-02-13 DIAGNOSIS — M79671 Pain in right foot: Secondary | ICD-10-CM | POA: Insufficient documentation

## 2018-02-13 DIAGNOSIS — M109 Gout, unspecified: Secondary | ICD-10-CM | POA: Insufficient documentation

## 2018-02-13 MED ORDER — VITAMIN D (ERGOCALCIFEROL) 1.25 MG (50000 UNIT) PO CAPS: 50000.0000 [IU] | ORAL_CAPSULE | ORAL | 1 refills | Status: DC

## 2018-02-13 MED FILL — VIT D2 1.25 MG (50,000 UNIT: 1.25 MG | 28 days supply | Qty: 4 | Fill #0

## 2018-02-13 NOTE — Progress Notes (Signed)
Assessment & Plan:  Rebecca Joseph was seen today for immunizations.  Diagnoses and all orders for this visit:  Right foot pain Continue allopurinol as prescribed. Follow up with podiatry as instructed.  Avoid gout triggers/foods/OTC medications that increase risk of gout flares.    Vitamin D deficiency disease -     Vitamin D, Ergocalciferol, (DRISDOL) 1.25 MG (50000 UT) CAPS capsule; Take 1 capsule (50,000 Units total) by mouth every 7 (seven) days.     Patient has been counseled on age-appropriate routine health concerns for screening and prevention. These are reviewed and up-to-date. Referrals have been placed accordingly. Immunizations are up-to-date or declined.    Subjective:   Chief Complaint  Patient presents with  . Immunizations    Pt. stated her ankle is much better and is following up with the podiatry. Pt. stated she would like to discuss with PCP regarding about Flu vaccine.    HPI Rebecca Joseph 42 y.o. female presents to office today for follow up to ankle pain.  She is currently seeing a podiatrist and notes improvement in her ankle pain. She also has a history of gout with last Uric acid level around 5 taking allopurinol 200 mg daily. She has questions regarding obtaining the flu vaccine today. After much discussion (pros and cons) and careful consideration she has decided against receiving the flu.   Review of Systems  Constitutional: Negative for fever, malaise/fatigue and weight loss.  HENT: Negative.  Negative for nosebleeds.   Eyes: Negative.  Negative for blurred vision, double vision and photophobia.  Respiratory: Negative.  Negative for cough and shortness of breath.   Cardiovascular: Negative.  Negative for chest pain, palpitations and leg swelling.  Gastrointestinal: Negative.  Negative for heartburn, nausea and vomiting.  Musculoskeletal: Negative.  Negative for myalgias.  Neurological: Negative.  Negative for dizziness, focal weakness, seizures  and headaches.  Psychiatric/Behavioral: Negative.  Negative for suicidal ideas.    Past Medical History:  Diagnosis Date  . Anemia   . Gout   . Hyperlipidemia   . Hypertension     Past Surgical History:  Procedure Laterality Date  . CESAREAN SECTION    . TUBAL LIGATION      Family History  Problem Relation Age of Onset  . Diabetes Father   . Hypertension Father   . Heart disease Father        CHF  . Gout Father   . Hypertension Mother   . Breast cancer Maternal Aunt   . Cancer Maternal Aunt   . Breast cancer Cousin   . Cancer Cousin        Maternal Cousin died from breast cancer    Social History Reviewed with no changes to be made today.   Outpatient Medications Prior to Visit  Medication Sig Dispense Refill  . allopurinol (ZYLOPRIM) 100 MG tablet Take 2 tablets (200 mg total) by mouth daily. 180 tablet 1  . colchicine 0.6 MG tablet TAKE 1 TABLET BY MOUTH 2 TIMES DAILY AS NEEDED 20 tablet 0  . ferrous sulfate 325 (65 FE) MG tablet Take 1 tablet (325 mg total) by mouth 2 (two) times daily with a meal. 60 tablet 3  . gemfibrozil (LOPID) 600 MG tablet Take 1 tablet (600 mg total) by mouth 2 (two) times daily before a meal. 180 tablet 3  . hydrochlorothiazide (HYDRODIURIL) 25 MG tablet Take 1 tablet (25 mg total) by mouth daily. 90 tablet 3  . metoprolol succinate (TOPROL-XL) 25 MG 24 hr tablet  Take 0.5 tablets (12.5 mg total) by mouth daily. 45 tablet 1  . Vitamin D, Ergocalciferol, (DRISDOL) 50000 units CAPS capsule Take 1 capsule (50,000 Units total) by mouth every 7 (seven) days. 12 capsule 1  . colchicine 0.6 MG tablet Take 1 tablet (0.6 mg total) by mouth daily. (Patient not taking: Reported on 02/13/2018) 15 tablet 2  . methylPREDNISolone (MEDROL DOSEPAK) 4 MG TBPK tablet Take as directed 21 tablet 0   No facility-administered medications prior to visit.     Allergies  Allergen Reactions  . Naproxen Shortness Of Breath       Objective:    BP 136/87 (BP  Location: Left Arm, Patient Position: Sitting, Cuff Size: Large)   Pulse (!) 104   Temp 98.2 F (36.8 C) (Oral)   Ht 5\' 5"  (1.651 m)   Wt 191 lb (86.6 kg)   LMP 01/26/2018   SpO2 100%   BMI 31.78 kg/m  Wt Readings from Last 3 Encounters:  02/13/18 191 lb (86.6 kg)  12/22/17 184 lb 12.8 oz (83.8 kg)  12/18/17 185 lb 9.6 oz (84.2 kg)    Physical Exam  Constitutional: She is oriented to person, place, and time. She appears well-developed and well-nourished. She is cooperative.  HENT:  Head: Normocephalic and atraumatic.  Eyes: EOM are normal.  Neck: Normal range of motion.  Cardiovascular: Normal rate, regular rhythm, normal heart sounds and intact distal pulses. Exam reveals no gallop and no friction rub.  No murmur heard. Pulmonary/Chest: Effort normal and breath sounds normal. No tachypnea. No respiratory distress. She has no decreased breath sounds. She has no wheezes. She has no rhonchi. She has no rales. She exhibits no tenderness.  Abdominal: Bowel sounds are normal.  Musculoskeletal: Normal range of motion. She exhibits no edema.  Neurological: She is alert and oriented to person, place, and time. Coordination normal.  Skin: Skin is warm and dry.  Psychiatric: She has a normal mood and affect. Her behavior is normal. Judgment and thought content normal.  Nursing note and vitals reviewed.      Patient has been counseled extensively about nutrition and exercise as well as the importance of adherence with medications and regular follow-up. The patient was given clear instructions to go to ER or return to medical center if symptoms don't improve, worsen or new problems develop. The patient verbalized understanding.   Follow-up: No follow-ups on file.   [name], FNP-BC [facility] [place], [place]   02/13/2018, 2:08 PM

## 2018-02-18 MED FILL — HYDROCHLOROTHIAZIDE 25 MG T: 25 | 30 days supply | Qty: 30 | Fill #4

## 2018-02-18 MED FILL — METOPROLOL SUCCINATE ER 25: 25 | 30 days supply | Qty: 15 | Fill #0

## 2018-03-04 MED FILL — GEMFIBROZIL 600 MG TAB: 600 | 30 days supply | Qty: 60 | Fill #5

## 2018-03-12 MED FILL — $COLCRYS 0.6 MG TABLET: 0.6 | 30 days supply | Qty: 30 | Fill #1

## 2018-03-20 MED FILL — ?ALLOPURINOL 100MG TABLET: 100 | 30 days supply | Qty: 60 | Fill #1

## 2018-03-23 ENCOUNTER — Ambulatory Visit: Payer: Self-pay | Attending: Nurse Practitioner | Admitting: Nurse Practitioner

## 2018-03-23 ENCOUNTER — Other Ambulatory Visit: Payer: Self-pay

## 2018-03-23 ENCOUNTER — Encounter: Payer: Self-pay | Admitting: Nurse Practitioner

## 2018-03-23 VITALS — BP 129/91 | HR 102 | Temp 98.8°F | Resp 16 | Wt 190.0 lb

## 2018-03-23 DIAGNOSIS — E785 Hyperlipidemia, unspecified: Secondary | ICD-10-CM | POA: Insufficient documentation

## 2018-03-23 DIAGNOSIS — Z79899 Other long term (current) drug therapy: Secondary | ICD-10-CM | POA: Insufficient documentation

## 2018-03-23 DIAGNOSIS — I10 Essential (primary) hypertension: Secondary | ICD-10-CM | POA: Insufficient documentation

## 2018-03-23 DIAGNOSIS — Z886 Allergy status to analgesic agent status: Secondary | ICD-10-CM | POA: Insufficient documentation

## 2018-03-23 DIAGNOSIS — Z8249 Family history of ischemic heart disease and other diseases of the circulatory system: Secondary | ICD-10-CM | POA: Insufficient documentation

## 2018-03-23 DIAGNOSIS — R000 Tachycardia, unspecified: Secondary | ICD-10-CM | POA: Insufficient documentation

## 2018-03-23 DIAGNOSIS — M109 Gout, unspecified: Secondary | ICD-10-CM | POA: Insufficient documentation

## 2018-03-23 MED ORDER — AMLODIPINE BESYLATE 5 MG PO TABS: 5.0000 mg | ORAL_TABLET | Freq: Every day | ORAL | 3 refills | Status: DC

## 2018-03-23 MED ORDER — METOPROLOL SUCCINATE ER 25 MG PO TB24: 25.0000 mg | ORAL_TABLET | Freq: Every day | ORAL | 1 refills | Status: DC

## 2018-03-23 MED FILL — METOPROLOL SUCCINATE ER 25: 25 | 30 days supply | Qty: 30 | Fill #0

## 2018-03-23 MED FILL — ?AMLODIPINE BESYLATE 5 MG T: 5 MG | 30 days supply | Qty: 30 | Fill #0

## 2018-03-23 NOTE — Progress Notes (Signed)
Follow up HTN 

## 2018-03-23 NOTE — Patient Instructions (Signed)
Low-Purine Diet  Purines are compounds that affect the level of uric acid in your body. A low-purine diet is a diet that is low in purines. Eating a low-purine diet can prevent the level of uric acid in your body from getting too high and causing gout or kidney stones or both.  What do I need to know about this diet?   Choose low-purine foods. Examples of low-purine foods are listed in the next section.   Drink plenty of fluids, especially water. Fluids can help remove uric acid from your body. Try to drink 8-16 cups (1.9-3.8 L) a day.   Limit foods high in fat, especially saturated fat, as fat makes it harder for the body to get rid of uric acid. Foods high in saturated fat include pizza, cheese, ice cream, whole milk, fried foods, and gravies. Choose foods that are lower in fat and lean sources of protein. Use olive oil when cooking as it contains healthy fats that are not high in saturated fat.   Limit alcohol. Alcohol interferes with the elimination of uric acid from your body. If you are having a gout attack, avoid all alcohol.   Keep in mind that different people's bodies react differently to different foods. You will probably learn over time which foods do or do not affect you. If you discover that a food tends to cause your gout to flare up, avoid eating that food. You can more freely enjoy foods that do not cause problems. If you have any questions about a food item, talk to your dietitian or health care provider.  Which foods are low, moderate, and high in purines?  The following is a list of foods that are low, moderate, and high in purines. You can eat any amount of the foods that are low in purines. You may be able to have small amounts of foods that are moderate in purines. Ask your health care provider how much of a food moderate in purines you can have. Avoid foods high in purines.  Grains   Foods low in purines: Enriched white bread, pasta, rice, cake, cornbread, popcorn.   Foods moderate in  purines: Whole-grain breads and cereals, wheat germ, bran, oatmeal. Uncooked oatmeal. Dry wheat bran or wheat germ.   Foods high in purines: Pancakes, French toast, biscuits, muffins.  Vegetables   Foods low in purines: All vegetables, except those that are moderate in purines.   Foods moderate in purines: Asparagus, cauliflower, spinach, mushrooms, green peas.  Fruits   All fruits are low in purines.  Meats and other Protein Foods   Foods low in purines: Eggs, nuts, peanut butter.   Foods moderate in purines: 80-90% lean beef, lamb, veal, pork, poultry, fish, eggs, peanut butter, nuts. Crab, lobster, oysters, and shrimp. Cooked dried beans, peas, and lentils.   Foods high in purines: Anchovies, sardines, herring, mussels, tuna, codfish, scallops, trout, and haddock. Bacon. Organ meats (such as liver or kidney). Tripe. Game meat. Goose. Sweetbreads.  Dairy   All dairy foods are low in purines. Low-fat and fat-free dairy products are best because they are low in saturated fat.  Beverages   Drinks low in purines: Water, carbonated beverages, tea, coffee, cocoa.   Drinks moderate in purines: Soft drinks and other drinks sweetened with high-fructose corn syrup. Juices. To find whether a food or drink is sweetened with high-fructose corn syrup, look at the ingredients list.   Drinks high in purines: Alcoholic beverages (such as beer).  Condiments   Foods   low in purines: Salt, herbs, olives, pickles, relishes, vinegar.   Foods moderate in purines: Butter, margarine, oils, mayonnaise.  Fats and Oils   Foods low in purines: All types, except gravies and sauces made with meat.   Foods high in purines: Gravies and sauces made with meat.  Other Foods   Foods low in purines: Sugars, sweets, gelatin. Cake. Soups made without meat.   Foods moderate in purines: Meat-based or fish-based soups, broths, or bouillons. Foods and drinks sweetened with high-fructose corn syrup.   Foods high in purines: High-fat desserts  (such as ice cream, cookies, cakes, pies, doughnuts, and chocolate).  Contact your dietitian for more information on foods that are not listed here.  This information is not intended to replace advice given to you by your health care provider. Make sure you discuss any questions you have with your health care provider.  Document Released: 07/20/2010 Document Revised: 08/31/2015 Document Reviewed: 03/01/2013  Elsevier Interactive Patient Education  2017 [employer]vier Inc[employer]      Gout  Gout is painful swelling that can occur in some of your joints. Gout is a type of arthritis. This condition is caused by having too much uric acid in your body. Uric acid is a chemical that forms when your body breaks down substances called purines. Purines are important for building body proteins.  When your body has too much uric acid, sharp crystals can form and build up inside your joints. This causes pain and swelling. Gout attacks can happen quickly and be very painful (acute gout). Over time, the attacks can affect more joints and become more frequent (chronic gout). Gout can also cause uric acid to build up under your skin and inside your kidneys.  What are the causes?  This condition is caused by too much uric acid in your blood. This can occur because:   Your kidneys do not remove enough uric acid from your blood. This is the most common cause.   Your body makes too much uric acid. This can occur with some cancers and cancer treatments. It can also occur if your body is breaking down too many red blood cells (hemolytic anemia).   You eat too many foods that are high in purines. These foods include organ meats and some seafood. Alcohol, especially beer, is also high in purines.    A gout attack may be triggered by trauma or stress.  What increases the risk?  This condition is more likely to develop in people who:   Have a family history of gout.   Are female and middle-aged.   Are female and have gone through menopause.   Are  obese.   Frequently drink alcohol, especially beer.   Are dehydrated.   Lose weight too quickly.   Have an organ transplant.   Have lead poisoning.   Take certain medicines, including aspirin, cyclosporine, diuretics, levodopa, and niacin.   Have kidney disease or psoriasis.    What are the signs or symptoms?  An attack of acute gout happens quickly. It usually occurs in just one joint. The most common place is the big toe. Attacks often start at night. Other joints that may be affected include joints of the feet, ankle, knee, fingers, wrist, or elbow. Symptoms may include:   Severe pain.   Warmth.   Swelling.   Stiffness.   Tenderness. The affected joint may be very painful to touch.   Shiny, red, or purple skin.   Chills and fever.      Chronic gout may cause symptoms more frequently. More joints may be involved. You may also have white or yellow lumps (tophi) on your hands or feet or in other areas near your joints.  How is this diagnosed?  This condition is diagnosed based on your symptoms, medical history, and physical exam. You may have tests, such as:   Blood tests to measure uric acid levels.   Removal of joint fluid with a needle (aspiration) to look for uric acid crystals.   X-rays to look for joint damage.    How is this treated?  Treatment for this condition has two phases: treating an acute attack and preventing future attacks. Acute gout treatment may include medicines to reduce pain and swelling, including:   NSAIDs.   Steroids. These are strong anti-inflammatory medicines that can be taken by mouth (orally) or injected into a joint.   Colchicine. This medicine relieves pain and swelling when it is taken soon after an attack. It can be given orally or through an IV tube.    Preventive treatment may include:   Daily use of smaller doses of NSAIDs or colchicine.   Use of a medicine that reduces uric acid levels in your blood.   Changes to your diet. You may need to see a specialist  about healthy eating (dietitian).    Follow these instructions at home:  During a Gout Attack   If directed, apply ice to the affected area:  ? Put ice in a plastic bag.  ? Place a towel between your skin and the bag.  ? Leave the ice on for 20 minutes, 2-3 times a day.   Rest the joint as much as possible. If the affected joint is in your leg, you may be given crutches to use.   Raise (elevate) the affected joint above the level of your heart as often as possible.   Drink enough fluids to keep your urine clear or pale yellow.   Take over-the-counter and prescription medicines only as told by your health care provider.   Do not drive or operate heavy machinery while taking prescription pain medicine.   Follow instructions from your health care provider about eating or drinking restrictions.   Return to your normal activities as told by your health care provider. Ask your health care provider what activities are safe for you.  Avoiding Future Gout Attacks   Follow a low-purine diet as told by your dietitian or health care provider. Avoid foods and drinks that are high in purines, including liver, kidney, anchovies, asparagus, herring, mushrooms, mussels, and beer.   Limit alcohol intake to no more than 1 drink a day for nonpregnant women and 2 drinks a day for men. One drink equals 12 oz of beer, 5 oz of wine, or 1 oz of hard liquor.   Maintain a healthy weight or lose weight if you are overweight. If you want to lose weight, talk with your health care provider. It is important that you do not lose weight too quickly.   Start or maintain an exercise program as told by your health care provider.   Drink enough fluids to keep your urine clear or pale yellow.   Take over-the-counter and prescription medicines only as told by your health care provider.   Keep all follow-up visits as told by your health care provider. This is important.  Contact a health care provider if:   You have another gout  attack.   You continue to have symptoms of   a gout attack after10 days of treatment.   You have side effects from your medicines.   You have chills or a fever.   You have burning pain when you urinate.   You have pain in your lower back or belly.  Get help right away if:   You have severe or uncontrolled pain.   You cannot urinate.  This information is not intended to replace advice given to you by your health care provider. Make sure you discuss any questions you have with your health care provider.  Document Released: 03/22/2000 Document Revised: 08/31/2015 Document Reviewed: 01/05/2015  Elsevier Interactive Patient Education  2018 Elsevier Inc.

## 2018-03-23 NOTE — Progress Notes (Signed)
Assessment & Plan:  There are no diagnoses linked to this encounter.  Patient has been counseled on age-appropriate routine health concerns for screening and prevention. These are reviewed and up-to-date. Referrals have been placed accordingly. Immunizations are up-to-date or declined.    Subjective:   Chief Complaint  Patient presents with  . Follow-up   HPI Rebecca Joseph 42 y.o. female presents to office today for follow up to HTN.   CHRONIC HYPERTENSION  Blood pressure range: She does not monitor her blood pressure at home.  BP Readings from Last 3 Encounters:  03/23/18 (!) 129/91  02/13/18 136/87  12/29/17 (!) 142/94   Chest pain: no   Dyspnea: no   Claudication: no  Medication compliance: yes, taking HCTZ (which I will stop today due to history of GOUT with recurrent flares) and metoprolol 12.5mg  daily (Which I will increase today to 25mg  due to tachycardia). She is taking tart cherry supplements for gout as well as allopurinol. Medication Side Effects  Lightheadedness: no   Urinary frequency: no   Edema: no   Impotence: no  Preventitive Healthcare:  Exercise: no   Diet Pattern: diet: general  Salt Restriction:  no   Review of Systems  Constitutional: Negative for fever, malaise/fatigue and weight loss.  HENT: Negative.  Negative for nosebleeds.   Eyes: Negative.  Negative for blurred vision, double vision and photophobia.  Respiratory: Negative.  Negative for cough and shortness of breath.   Cardiovascular: Negative.  Negative for chest pain, palpitations and leg swelling.  Gastrointestinal: Negative.  Negative for heartburn, nausea and vomiting.  Musculoskeletal: Negative.  Negative for myalgias.  Neurological: Negative.  Negative for dizziness, focal weakness, seizures and headaches.  Psychiatric/Behavioral: Negative.  Negative for suicidal ideas.    Past Medical History:  Diagnosis Date  . Anemia   . Gout   . Hyperlipidemia   . Hypertension      Past Surgical History:  Procedure Laterality Date  . CESAREAN SECTION    . TUBAL LIGATION      Family History  Problem Relation Age of Onset  . Diabetes Father   . Hypertension Father   . Heart disease Father        CHF  . Gout Father   . Hypertension Mother   . Breast cancer Maternal Aunt   . Cancer Maternal Aunt   . Breast cancer Cousin   . Cancer Cousin        Maternal Cousin died from breast cancer    Social History Reviewed with no changes to be made today.   Outpatient Medications Prior to Visit  Medication Sig Dispense Refill  . allopurinol (ZYLOPRIM) 100 MG tablet Take 2 tablets (200 mg total) by mouth daily. 180 tablet 1  . colchicine 0.6 MG tablet Take 1 tablet (0.6 mg total) by mouth daily. 15 tablet 2  . hydrochlorothiazide (HYDRODIURIL) 25 MG tablet Take 1 tablet (25 mg total) by mouth daily. 90 tablet 3  . metoprolol succinate (TOPROL-XL) 25 MG 24 hr tablet Take 0.5 tablets (12.5 mg total) by mouth daily. 45 tablet 1  . Vitamin D, Ergocalciferol, (DRISDOL) 1.25 MG (50000 UT) CAPS capsule Take 1 capsule (50,000 Units total) by mouth every 7 (seven) days. 12 capsule 1  . colchicine 0.6 MG tablet TAKE 1 TABLET BY MOUTH 2 TIMES DAILY AS NEEDED 20 tablet 0  . ferrous sulfate 325 (65 FE) MG tablet Take 1 tablet (325 mg total) by mouth 2 (two) times daily with a meal. (  Patient not taking: Reported on 03/23/2018) 60 tablet 3  . gemfibrozil (LOPID) 600 MG tablet Take 1 tablet (600 mg total) by mouth 2 (two) times daily before a meal. 180 tablet 3  . methylPREDNISolone (MEDROL DOSEPAK) 4 MG TBPK tablet Take as directed (Patient not taking: Reported on 03/23/2018) 21 tablet 0   No facility-administered medications prior to visit.     Allergies  Allergen Reactions  . Naproxen Shortness Of Breath       Objective:    BP (!) 129/91   Pulse (!) 102   Temp 98.8 F (37.1 C) (Oral)   Resp 16   Wt 190 lb (86.2 kg)   LMP 03/20/2018 (Exact Date)   SpO2 100%   BMI  31.62 kg/m  Wt Readings from Last 3 Encounters:  03/23/18 190 lb (86.2 kg)  02/13/18 191 lb (86.6 kg)  12/22/17 184 lb 12.8 oz (83.8 kg)    Physical Exam Vitals signs and nursing note reviewed.  Constitutional:      Appearance: She is well-developed.  HENT:     Head: Normocephalic and atraumatic.  Neck:     Musculoskeletal: Normal range of motion.  Cardiovascular:     Rate and Rhythm: Regular rhythm. Tachycardia present.     Heart sounds: Normal heart sounds. No murmur. No friction rub. No gallop.   Pulmonary:     Effort: Pulmonary effort is normal. No tachypnea or respiratory distress.     Breath sounds: Normal breath sounds. No decreased breath sounds, wheezing, rhonchi or rales.  Chest:     Chest wall: No tenderness.  Abdominal:     General: Bowel sounds are normal.     Palpations: Abdomen is soft.  Musculoskeletal: Normal range of motion.  Skin:    General: Skin is warm and dry.  Neurological:     Mental Status: She is alert and oriented to person, place, and time.     Coordination: Coordination normal.  Psychiatric:        Behavior: Behavior normal. Behavior is cooperative.        Thought Content: Thought content normal.        Judgment: Judgment normal.          Patient has been counseled extensively about nutrition and exercise as well as the importance of adherence with medications and regular follow-up. The patient was given clear instructions to go to ER or return to medical center if symptoms don't improve, worsen or new problems develop. The patient verbalized understanding.   Follow-up: No follow-ups on file.   [name], FNP-BC [facility], [place]   03/23/2018, 9:28 AM

## 2018-04-13 ENCOUNTER — Ambulatory Visit: Payer: Self-pay | Attending: Nurse Practitioner | Admitting: Pharmacist

## 2018-04-13 ENCOUNTER — Encounter: Payer: Self-pay | Admitting: Pharmacist

## 2018-04-13 VITALS — BP 117/76 | HR 99

## 2018-04-13 DIAGNOSIS — Z87891 Personal history of nicotine dependence: Secondary | ICD-10-CM | POA: Insufficient documentation

## 2018-04-13 DIAGNOSIS — Z8249 Family history of ischemic heart disease and other diseases of the circulatory system: Secondary | ICD-10-CM | POA: Insufficient documentation

## 2018-04-13 DIAGNOSIS — I10 Essential (primary) hypertension: Secondary | ICD-10-CM | POA: Insufficient documentation

## 2018-04-13 NOTE — Patient Instructions (Signed)
Thank you for coming to see us today.   Blood pressure today is well-controlled! Great job!  Continue taking blood pressure medications as prescribed.   Limiting salt and caffeine, as well as exercising as able for at least 30 minutes for 5 days out of the week, can also help you lower your blood pressure.  Take your blood pressure at home if you are able. Please write down these numbers and bring them to your visits.  If you have any questions about medications, please call me [phone].  Luke  

## 2018-04-13 NOTE — Progress Notes (Signed)
   S:    PCP: Zelda  Patient arrives in good spirits. Presents to the clinic for hypertension management. Patient was referred by Zelda on 03/23/18. Amlodipine and metoprolol increased d/t elevated BP and tachycardia.  Today, pt denies chest pain, shortness of breath, HA, or blurred vision. Denies BLE edema.    Patient reports adherence with medications.  Current BP Medications include:  Amlodipine 5 mg daily, metoprolol succinate 25 mg XL daily  Dietary habits include: denies drinking caffeine but does not limit salt Exercise habits include: none outside of work Family / Social history: HTN (father, mother), former smoker (quit "years ago when I was 81"), consumes 1 alcoholic beverage daily  Home BP readings: reports 120s/70s  O:  L arm after 5 minutes rest: 117/76, HR 99 Last 3 Office BP readings: BP Readings from Last 3 Encounters:  04/13/18 117/76  03/23/18 (!) 129/91  02/13/18 136/87    BMET    Component Value Date/Time   NA 139 11/28/2016 1057   K 4.5 11/28/2016 1057   CL 101 11/28/2016 1057   CO2 22 11/28/2016 1057   GLUCOSE 94 11/28/2016 1057   GLUCOSE 114 (H) 09/08/2015 0902   BUN 11 11/28/2016 1057   CREATININE 0.77 11/28/2016 1057   CALCIUM 9.8 11/28/2016 1057   GFRNONAA 96 11/28/2016 1057   GFRAA 111 11/28/2016 1057    Renal function: CrCl cannot be calculated (Patient's most recent lab result is older than the maximum 21 days allowed.).  Clinical ASCVD: No  The 10-year ASCVD risk score Mikey Bussing DC Jr., et al., 2013) is: 0.9%   Values used to calculate the score:     Age: 43 years     Sex: Female     Is Non-Hispanic African American: Yes     Diabetic: No     Tobacco smoker: No     Systolic Blood Pressure: 793 mmHg     Is BP treated: Yes     HDL Cholesterol: 57 mg/dL     Total Cholesterol: 174 mg/dL   A/P: Hypertension longstanding currently controlled on current medications. BP Goal <130/80 mmHg. Patient is adherent with current medications.    -Continued current regimen.  -Counseled on lifestyle modifications for blood pressure control including reduced dietary sodium, increased exercise, adequate sleep  Results reviewed and written information provided.   Total time in face-to-face counseling 15 minutes.   F/U Clinic Visit in 06/22/18.    Benard Halsted, PharmD, [place] [phone]

## 2018-04-15 MED FILL — GEMFIBROZIL 600 MG TAB: 600 | 30 days supply | Qty: 60 | Fill #6

## 2018-04-20 MED FILL — ?AMLODIPINE BESYLATE 5 MG T: 5 MG | 30 days supply | Qty: 30 | Fill #1

## 2018-04-20 MED FILL — METOPROLOL SUCCINATE ER 25: 25 | 30 days supply | Qty: 30 | Fill #1

## 2018-04-23 ENCOUNTER — Other Ambulatory Visit: Payer: Self-pay | Admitting: Podiatry

## 2018-04-24 NOTE — Telephone Encounter (Signed)
Left message informing pt, our office had received request for colchicine, that she was prescribed in 01/2018 with 2 refills, and if she had taken the colchicine and the 2 refills and was currently having symptoms she needed an appt.

## 2018-04-25 ENCOUNTER — Ambulatory Visit (INDEPENDENT_AMBULATORY_CARE_PROVIDER_SITE_OTHER): Payer: Medicaid Other

## 2018-04-25 ENCOUNTER — Ambulatory Visit: Payer: Medicaid Other | Admitting: Podiatry

## 2018-04-25 ENCOUNTER — Encounter: Payer: Self-pay | Admitting: Podiatry

## 2018-04-25 VITALS — BP 131/94 | HR 120 | Temp 98.3°F | Resp 16

## 2018-04-25 DIAGNOSIS — M109 Gout, unspecified: Secondary | ICD-10-CM

## 2018-04-25 MED ORDER — METHYLPREDNISOLONE 4 MG PO TBPK: ORAL_TABLET | ORAL | 0 refills | Status: DC

## 2018-04-25 NOTE — Patient Instructions (Signed)

## 2018-04-25 NOTE — Progress Notes (Signed)
Subjective: 42 year old female presents the office today for concerns of recurrent gout to the right big toe.  She states that she had a flareup last Saturday and got better.  She has some colchicine at home that she was using but she is not been taking it since this flare this week.  She is on allopurinol she is up to 200 mg daily.  She says the right big toe is swollen and painful but she denies any recent injury or trauma to the area. Denies any systemic complaints such as fevers, chills, nausea, vomiting. No acute changes since last appointment, and no other complaints at this time.   Objective: AAO x3, NAD DP/PT pulses palpable bilaterally, CRT less than 3 seconds There is edema to the right first MPJ on the right side.  There is pain with MPJ range of motion tenderness directly on the joint.  There is no erythema or increase in warmth.  There is no fluctuation or crepitation.  No other areas of tenderness identified at this time. No open lesions or pre-ulcerative lesions.  No pain with calf compression, swelling, warmth, erythema  Assessment: Capsulitis, gout right first MPJ  Plan: -All treatment options discussed with the patient including all alternatives, risks, complications.  -Today prescribed a Medrol Dosepak.  Discussed steroid injection.  Hold colchicine.  We will recheck a uric acid level.  [facility]  Continue allopurinol for now.  Will consider adjusting the dose based on the uric acid level. -Patient encouraged to call the office with any questions, concerns, change in symptoms.   Trula Slade DPM

## 2018-05-04 MED FILL — ?ALLOPURINOL 100MG TABLET: 100 | 30 days supply | Qty: 60 | Fill #2

## 2018-05-15 ENCOUNTER — Other Ambulatory Visit: Payer: Self-pay | Admitting: Podiatry

## 2018-05-15 ENCOUNTER — Ambulatory Visit: Payer: Self-pay | Attending: Family Medicine

## 2018-05-16 LAB — URIC ACID: Uric Acid: 4.5 mg/dL (ref 2.5–7.1)

## 2018-05-18 ENCOUNTER — Ambulatory Visit: Payer: Medicaid Other | Admitting: Podiatry

## 2018-05-21 ENCOUNTER — Telehealth: Payer: Self-pay | Admitting: *Deleted

## 2018-05-21 DIAGNOSIS — M76829 Posterior tibial tendinitis, unspecified leg: Secondary | ICD-10-CM

## 2018-05-21 DIAGNOSIS — M109 Gout, unspecified: Secondary | ICD-10-CM

## 2018-05-21 MED FILL — METOPROLOL SUCCINATE ER 25: 25 | 30 days supply | Qty: 30 | Fill #2

## 2018-05-21 MED FILL — GEMFIBROZIL 600 MG TAB: 600 | 30 days supply | Qty: 60 | Fill #7

## 2018-05-21 MED FILL — AMLODIPINE BESYLATE 5 MG TA: 5 | 30 days supply | Qty: 30 | Fill #2

## 2018-05-21 NOTE — Telephone Encounter (Signed)
Left message for pt to call for results  

## 2018-05-21 NOTE — Telephone Encounter (Signed)
-----   Message from Trula Slade, DPM sent at 05/20/2018  1:40 PM EST ----- Val- Please let her know that her uric acid level is 4.5 and better than 2 months ago. I would keep the medications the same for now. Thanks.

## 2018-05-21 NOTE — Telephone Encounter (Signed)
Pt called for results. I informed pt of Dr. Leigh Aurora review of results and orders. Pt states she was on the cortisone when she had the labs drawn, and that may be why the results were low. Pt states she had a gout flare over the weekend and Dr. Amalia Hailey called in colchicine.

## 2018-05-21 NOTE — Telephone Encounter (Signed)
OK thanks. I will see her back as scheduled. Should consider a rheumatology referral as well. Thanks.

## 2018-05-21 NOTE — Telephone Encounter (Signed)
Please go ahead and put in the referral for her. I will still see her back as scheduled. Thanks.

## 2018-05-22 NOTE — Addendum Note (Signed)
Addended by: Harriett Sine D on: 05/22/2018 09:56 AM   Modules accepted: Orders

## 2018-05-22 NOTE — Telephone Encounter (Signed)
I informed pt of the referral to Rheumatology. Faxed referral, clinicals and demographics to [facility].

## 2018-05-25 NOTE — Telephone Encounter (Addendum)
Left message for pt to call with updated insurance information. I will need insurance information or pt's confirmation she would like to see rheumatologist as self pay.

## 2018-05-25 NOTE — Telephone Encounter (Signed)
Dr. Estanislado Pandy declined referral.

## 2018-05-28 ENCOUNTER — Ambulatory Visit: Payer: Medicaid Other | Admitting: Podiatry

## 2018-06-01 ENCOUNTER — Ambulatory Visit (INDEPENDENT_AMBULATORY_CARE_PROVIDER_SITE_OTHER): Payer: Self-pay | Admitting: Podiatry

## 2018-06-01 DIAGNOSIS — M109 Gout, unspecified: Secondary | ICD-10-CM

## 2018-06-01 NOTE — Patient Instructions (Signed)

## 2018-06-03 DIAGNOSIS — M109 Gout, unspecified: Secondary | ICD-10-CM | POA: Insufficient documentation

## 2018-06-03 NOTE — Progress Notes (Signed)
Subjective: 43 year old female presents the office today for evaluation of gout.  She last saw Dr. Amalia Hailey for this and she was put on colchicine.  Colchicine did help limit her symptoms.  We did check her uric acid level however she was on medication when she got this checked.  She wants to hold off on any rheumatology evaluation any further treatment options.  She still taking 200 mg allopurinol daily as well. Denies any systemic complaints such as fevers, chills, nausea, vomiting. No acute changes since last appointment, and no other complaints at this time.   Objective: AAO x3, NAD DP/PT pulses palpable bilaterally, CRT less than 3 seconds in the bilateral lower extremities  At this time there is no evidence of acute gout bilaterally there is no significant swelling or erythema.  There is no area of tenderness identified to bilateral lower extremities.  Range of motion intact but any pain or restrictions. No open lesions or pre-ulcerative lesions.  No pain with calf compression, swelling, warmth, erythema  Assessment: Recurrent gout  Plan: -All treatment options discussed with the patient including all alternatives, risks, complications.  -At this time she also hold off on any rheumatology evaluation of her medications.  We discussed we can use colchicine in an acute flare but we also discussed diet modifications as well as hydration.  If she continues to get recurrent flares then will further evaluate but she wants to hold off for now. -Patient encouraged to call the office with any questions, concerns, change in symptoms.   Trula Slade DPM

## 2018-06-04 NOTE — Telephone Encounter (Signed)
Left message requesting a call back with insurance information to make referral.

## 2018-06-04 NOTE — Addendum Note (Signed)
Addended by: Harriett Sine D on: 06/04/2018 09:47 AM   Modules accepted: Orders

## 2018-06-05 MED FILL — ?ALLOPURINOL 100MG TABLET: 100 | 30 days supply | Qty: 60 | Fill #3

## 2018-06-05 NOTE — Addendum Note (Signed)
Addended by: Harriett Sine D on: 06/05/2018 10:08 AM   Modules accepted: Orders

## 2018-06-05 NOTE — Telephone Encounter (Signed)
Pt called states she has been trying to work on her problem with diet, but has not helped and would like referral to Rheumatology as self-pay. I told pt I would make a referral to [facility].

## 2018-06-18 MED FILL — AMLODIPINE BESYLATE 5 MG TA: 5 | 30 days supply | Qty: 30 | Fill #3

## 2018-06-18 MED FILL — ?METOPROLOL SUCC ER 25MG TA: 25 | 30 days supply | Qty: 30 | Fill #3

## 2018-06-22 ENCOUNTER — Ambulatory Visit: Payer: Medicaid Other | Admitting: Nurse Practitioner

## 2018-06-29 MED FILL — GEMFIBROZIL 600 MG TAB: 600 | 30 days supply | Qty: 60 | Fill #8

## 2018-07-02 MED FILL — ?ALLOPURINOL 100MG TABLET: 100 | 30 days supply | Qty: 60 | Fill #4

## 2018-07-07 ENCOUNTER — Other Ambulatory Visit: Payer: Self-pay | Admitting: Pharmacist

## 2018-07-07 NOTE — Telephone Encounter (Signed)
Received refill request from [facility] pharmacy for colchicine. Will route request to [facility] managing patient's gout.

## 2018-07-08 ENCOUNTER — Ambulatory Visit: Payer: Medicaid Other | Admitting: Nurse Practitioner

## 2018-07-09 ENCOUNTER — Other Ambulatory Visit: Payer: Self-pay | Admitting: Pharmacist

## 2018-07-09 MED ORDER — COLCHICINE 0.6 MG PO TABS: 0.6000 mg | ORAL_TABLET | Freq: Every day | ORAL | 0 refills | Status: DC

## 2018-07-10 ENCOUNTER — Telehealth: Payer: Self-pay | Admitting: *Deleted

## 2018-07-13 MED FILL — AMLODIPINE BESYLATE 5 MG TA: 5 | 30 days supply | Qty: 30 | Fill #4

## 2018-07-13 MED FILL — ?METOPROLOL SUCC ER 25MG TA: 25 | 30 days supply | Qty: 30 | Fill #4

## 2018-07-17 MED FILL — $COLCRYS 0.6 MG TABLET: 0.6 | 15 days supply | Qty: 15 | Fill #0

## 2018-07-27 NOTE — Telephone Encounter (Signed)
enterer in error.

## 2018-07-30 MED FILL — GEMFIBROZIL 600 MG TAB: 600 | 30 days supply | Qty: 60 | Fill #1

## 2018-08-04 ENCOUNTER — Ambulatory Visit: Payer: Self-pay | Attending: Nurse Practitioner | Admitting: Nurse Practitioner

## 2018-08-04 ENCOUNTER — Other Ambulatory Visit: Payer: Self-pay

## 2018-08-04 ENCOUNTER — Encounter: Payer: Self-pay | Admitting: Nurse Practitioner

## 2018-08-04 DIAGNOSIS — E783 Hyperchylomicronemia: Secondary | ICD-10-CM

## 2018-08-04 DIAGNOSIS — M109 Gout, unspecified: Secondary | ICD-10-CM

## 2018-08-04 DIAGNOSIS — I10 Essential (primary) hypertension: Secondary | ICD-10-CM

## 2018-08-04 DIAGNOSIS — E559 Vitamin D deficiency, unspecified: Secondary | ICD-10-CM

## 2018-08-04 DIAGNOSIS — R000 Tachycardia, unspecified: Secondary | ICD-10-CM

## 2018-08-04 DIAGNOSIS — D500 Iron deficiency anemia secondary to blood loss (chronic): Secondary | ICD-10-CM

## 2018-08-04 MED ORDER — GEMFIBROZIL 600 MG PO TABS: 600.0000 mg | ORAL_TABLET | Freq: Two times a day (BID) | ORAL | 3 refills | Status: DC

## 2018-08-04 MED ORDER — COLCHICINE 0.6 MG PO TABS: 0.6000 mg | ORAL_TABLET | Freq: Every day | ORAL | 0 refills | Status: DC

## 2018-08-04 MED ORDER — METOPROLOL SUCCINATE ER 25 MG PO TB24: 25.0000 mg | ORAL_TABLET | Freq: Every day | ORAL | 1 refills | Status: DC

## 2018-08-04 MED ORDER — ALLOPURINOL 100 MG PO TABS: 200.0000 mg | ORAL_TABLET | Freq: Every day | ORAL | 1 refills | Status: DC

## 2018-08-04 MED ORDER — AMLODIPINE BESYLATE 5 MG PO TABS: 5.0000 mg | ORAL_TABLET | Freq: Every day | ORAL | 3 refills | Status: DC

## 2018-08-04 MED FILL — ?ALLOPURINOL 100MG TABLET: 100 | 30 days supply | Qty: 60 | Fill #5

## 2018-08-04 NOTE — Progress Notes (Signed)
Virtual Visit via Telephone Note Due to national recommendations of social distancing due to [place] 19, telehealth visit is felt to be most appropriate for this patient at this time.  I discussed the limitations, risks, security and privacy concerns of performing an evaluation and management service by telephone and the availability of in person appointments. I also discussed with the patient that there may be a patient responsible charge related to this service. The patient expressed understanding and agreed to proceed.    I connected with Rebecca Joseph on 08/04/18  at   3:10 PM EDT  by telephone and verified that I am speaking with the correct person using two identifiers.   Consent I discussed the limitations, risks, security and privacy concerns of performing an evaluation and management service by telephone and the availability of in person appointments. I also discussed with the patient that there may be a patient responsible charge related to this service. The patient expressed understanding and agreed to proceed.   Location of Patient: Private Residence   Location of Provider: [place] and [place] participating in Telemedicine visit: [name] FNP-BC [place]    History of Present Illness: Telemedicine visit for: Follow up to HTN, HPL and Gout.   Essential Hypertension Chronic. She is not monitoring her blood pressure at home. Denies chest pain, shortness of breath, palpitations, lightheadedness, dizziness, headaches or BLE edema. Taking amlodipine 43m as prescribed.  BP Readings from Last 3 Encounters:  04/25/18 (!) 131/94  04/13/18 117/76  03/23/18 (!) 129/91   Hyperlipidemia LDL at goal. Triglycerides down from 404 to 70! Taking gemfibrozil 600 mg BID as prescribed. She denies any statin intolerance or myalgias.  Lab Results  Component Value Date   LDLCALC 57 08/06/2018   Past Medical History:   Diagnosis Date  . Anemia   . Gout   . Hyperlipidemia   . Hypertension     Past Surgical History:  Procedure Laterality Date  . CESAREAN SECTION    . TUBAL LIGATION      Family History  Problem Relation Age of Onset  . Diabetes Father   . Hypertension Father   . Heart disease Father        CHF  . Gout Father   . Hypertension Mother   . Breast cancer Maternal Aunt   . Cancer Maternal Aunt   . Breast cancer Cousin   . Cancer Cousin        Maternal Cousin died from breast cancer    Social History   Socioeconomic History  . Marital status: Legally Separated    Spouse name: Not on file  . Number of children: Not on file  . Years of education: Not on file  . Highest education level: Not on file  Occupational History  . Not on file  Social Needs  . Financial resource strain: Not on file  . Food insecurity:    Worry: Not on file    Inability: Not on file  . Transportation needs:    Medical: Not on file    Non-medical: Not on file  Tobacco Use  . Smoking status: Former SResearch scientist (life sciences) . Smokeless tobacco: Never Used  Substance and Sexual Activity  . Alcohol use: Yes    Comment: occasionally  . Drug use: No  . Sexual activity: Yes    Birth control/protection: Surgical  Lifestyle  . Physical activity:    Days per week: Not on file  Minutes per session: Not on file  . Stress: Not on file  Relationships  . Social connections:    Talks on phone: Not on file    Gets together: Not on file    Attends religious service: Not on file    Active member of club or organization: Not on file    Attends meetings of clubs or organizations: Not on file    Relationship status: Not on file  Other Topics Concern  . Not on file  Social History Narrative  . Not on file     Observations/Objective: Awake, alert and oriented x 3   Review of Systems  Constitutional: Negative for fever, malaise/fatigue and weight loss.  HENT: Negative.  Negative for nosebleeds.   Eyes: Negative.   Negative for blurred vision, double vision and photophobia.  Respiratory: Negative.  Negative for cough and shortness of breath.   Cardiovascular: Negative.  Negative for chest pain, palpitations and leg swelling.  Gastrointestinal: Negative.  Negative for heartburn, nausea and vomiting.  Musculoskeletal: Negative.  Negative for myalgias.  Neurological: Negative.  Negative for dizziness, focal weakness, seizures and headaches.  Psychiatric/Behavioral: Negative.  Negative for suicidal ideas.    Assessment and Plan: [place] was seen today for follow-up.  Diagnoses and all orders for this visit:  Essential hypertension -     amLODipine (NORVASC) 5 MG tablet; Take 1 tablet (5 mg total) by mouth daily. -     CBC -     CMP14+EGFR Continue all antihypertensives as prescribed.  Remember to bring in your blood pressure log with you for your follow up appointment.  DASH/Mediterranean Diets are healthier choices for HTN.    Mixed hyperglyceridemia -     gemfibrozil (LOPID) 600 MG tablet; Take 1 tablet (600 mg total) by mouth 2 (two) times daily before a meal. INSTRUCTIONS: Work on a low fat, heart healthy diet and participate in regular aerobic exercise program by working out at least 150 minutes per week; 5 days a week-30 minutes per day. Avoid red meat, fried foods. junk foods, sodas, sugary drinks, unhealthy snacking, alcohol and smoking.  Drink at least 48oz of water per day and monitor your carbohydrate intake daily.  -     Lipid panel  Tachycardia -     metoprolol succinate (TOPROL-XL) 25 MG 24 hr tablet; Take 1 tablet (25 mg total) by mouth daily. Chronic. Stable. She denies palpitations  Gout of right foot, unspecified cause, unspecified chronicity Chronic and stable -     allopurinol (ZYLOPRIM) 100 MG tablet; Take 2 tablets (200 mg total) by mouth daily. -     colchicine 0.6 MG tablet; Take 1 tablet (0.6 mg total) by mouth daily for 30 days. -     Uric Acid Lab Results  Component  Value Date   LABURIC 4.9 08/06/2018    Vitamin D deficiency disease -     VITAMIN D 25 Hydroxy (Vit-D Deficiency, Fractures)     Follow Up Instructions Return in about 2 weeks (around 08/18/2018) for Fasting labs.     I discussed the assessment and treatment plan with the patient. The patient was provided an opportunity to ask questions and all were answered. The patient agreed with the plan and demonstrated an understanding of the instructions.   The patient was advised to call back or seek an in-person evaluation if the symptoms worsen or if the condition fails to improve as anticipated.  I provided 23 minutes of non-face-to-face time during this encounter including median  intraservice time, reviewing previous notes, labs, imaging, medications and explaining diagnosis and management.  [name], FNP-BC

## 2018-08-06 ENCOUNTER — Ambulatory Visit: Payer: Medicaid Other | Attending: Family Medicine

## 2018-08-06 ENCOUNTER — Other Ambulatory Visit: Payer: Self-pay

## 2018-08-06 MED FILL — AMLODIPINE BESYLATE 5 MG TA: 5 | 30 days supply | Qty: 30 | Fill #5

## 2018-08-07 ENCOUNTER — Encounter: Payer: Self-pay | Admitting: Nurse Practitioner

## 2018-08-07 LAB — CMP14+EGFR
ALT: 8 IU/L (ref 0–32)
AST: 13 IU/L (ref 0–40)
Albumin/Globulin Ratio: 1.6 (ref 1.2–2.2)
Albumin: 4.2 g/dL (ref 3.8–4.8)
Alkaline Phosphatase: 91 IU/L (ref 39–117)
BUN/Creatinine Ratio: 18 (ref 9–23)
BUN: 14 mg/dL (ref 6–24)
Bilirubin Total: 0.3 mg/dL (ref 0.0–1.2)
CO2: 18 mmol/L — ABNORMAL LOW (ref 20–29)
Calcium: 9.3 mg/dL (ref 8.7–10.2)
Chloride: 107 mmol/L — ABNORMAL HIGH (ref 96–106)
Creatinine, Ser: 0.76 mg/dL (ref 0.57–1.00)
GFR calc Af Amer: 111 mL/min/{1.73_m2} (ref >59)
GFR calc non Af Amer: 96 mL/min/{1.73_m2} (ref >59)
Globulin, Total: 2.7 g/dL (ref 1.5–4.5)
Glucose: 94 mg/dL (ref 65–99)
Potassium: 4.1 mmol/L (ref 3.5–5.2)
Sodium: 137 mmol/L (ref 134–144)
Total Protein: 6.9 g/dL (ref 6.0–8.5)

## 2018-08-07 LAB — LIPID PANEL
Chol/HDL Ratio: 2.9 ratio (ref 0.0–4.4)
Cholesterol, Total: 109 mg/dL (ref 100–199)
HDL: 38 mg/dL — ABNORMAL LOW (ref >39)
LDL Calculated: 57 mg/dL (ref 0–99)
Triglycerides: 70 mg/dL (ref 0–149)
VLDL Cholesterol Cal: 14 mg/dL (ref 5–40)

## 2018-08-07 LAB — CBC
Hematocrit: 26.8 % — ABNORMAL LOW (ref 34.0–46.6)
Hemoglobin: 7.7 g/dL — ABNORMAL LOW (ref 11.1–15.9)
MCH: 18.6 pg — ABNORMAL LOW (ref 26.6–33.0)
MCHC: 28.7 g/dL — ABNORMAL LOW (ref 31.5–35.7)
MCV: 65 fL — ABNORMAL LOW (ref 79–97)
Platelets: 337 10*3/uL (ref 150–450)
RBC: 4.14 x10E6/uL (ref 3.77–5.28)
RDW: 24.9 % — ABNORMAL HIGH (ref 11.7–15.4)
WBC: 6.9 10*3/uL (ref 3.4–10.8)

## 2018-08-07 LAB — VITAMIN D 25 HYDROXY (VIT D DEFICIENCY, FRACTURES): Vit D, 25-Hydroxy: 20.1 ng/mL — ABNORMAL LOW (ref 30.0–100.0)

## 2018-08-07 LAB — URIC ACID: Uric Acid: 4.9 mg/dL (ref 2.5–7.1)

## 2018-08-07 MED ORDER — FERROUS SULFATE 325 (65 FE) MG PO TABS: 325.0000 mg | ORAL_TABLET | Freq: Two times a day (BID) | ORAL | 3 refills | Status: DC

## 2018-08-07 MED FILL — VIT D2 1.25 MG (50,000 UNIT: 1.25 MG | 28 days supply | Qty: 4 | Fill #1

## 2018-08-08 MED FILL — FERROUS SULFATE 325 MG TAB: 325 (65 FE) | 90 days supply | Qty: 180 | Fill #0

## 2018-08-13 MED FILL — ?METOPROLOL SUCC ER 25MG TA: 25 | 30 days supply | Qty: 30 | Fill #5

## 2018-08-28 ENCOUNTER — Other Ambulatory Visit: Payer: Self-pay | Admitting: Nurse Practitioner

## 2018-08-28 DIAGNOSIS — M109 Gout, unspecified: Secondary | ICD-10-CM

## 2018-09-01 MED FILL — ?ALLOPURINOL 100MG TABLET: 100 | 30 days supply | Qty: 60 | Fill #0

## 2018-09-09 MED FILL — ?METOPROLOL SUCC ER 25MG TA: 25 | 30 days supply | Qty: 30 | Fill #0

## 2018-09-09 MED FILL — GEMFIBROZIL 600 MG TAB: 600 | 30 days supply | Qty: 60 | Fill #2

## 2018-09-09 MED FILL — ?AMLODIPINE BESYLATE 5MG TA: 5 | 30 days supply | Qty: 30 | Fill #6

## 2018-09-24 MED FILL — $COLCRYS 0.6 MG TABLET: 0.6 | 15 days supply | Qty: 15 | Fill #0

## 2018-10-05 MED FILL — ?ALLOPURINOL 100MG TABLET: 100 | 30 days supply | Qty: 60 | Fill #1

## 2018-10-05 MED FILL — ?AMLODIPINE BESYLATE 5MG TA: 5 | 30 days supply | Qty: 30 | Fill #7

## 2018-10-08 ENCOUNTER — Telehealth: Payer: Self-pay | Admitting: Nurse Practitioner

## 2018-10-08 ENCOUNTER — Encounter: Payer: Self-pay | Admitting: Family Medicine

## 2018-10-08 ENCOUNTER — Ambulatory Visit: Payer: Self-pay | Attending: Family Medicine | Admitting: Family Medicine

## 2018-10-08 ENCOUNTER — Other Ambulatory Visit: Payer: Self-pay

## 2018-10-08 DIAGNOSIS — M109 Gout, unspecified: Secondary | ICD-10-CM

## 2018-10-08 MED ORDER — PREDNISONE 20 MG PO TABS: 20.0000 mg | ORAL_TABLET | Freq: Two times a day (BID) | ORAL | 0 refills | Status: DC

## 2018-10-08 MED FILL — predniSONE 20 MG TABS: 20 | 5 days supply | Qty: 10 | Fill #0

## 2018-10-08 NOTE — Telephone Encounter (Signed)
Patient had a phone visit and the medication was sent.

## 2018-10-08 NOTE — Progress Notes (Signed)
Virtual Visit via Telephone Note  I connected with Rebecca Joseph, on 10/08/2018 at 11:10 AM by telephone due to the COVID-19 pandemic and verified that I am speaking with the correct person using two identifiers.   Consent: I discussed the limitations, risks, security and privacy concerns of performing an evaluation and management service by telephone and the availability of in person appointments. I also discussed with the patient that there may be a patient responsible charge related to this service. The patient expressed understanding and agreed to proceed.   Location of Patient: Home  Location of Provider: Clinic   Persons participating in Telemedicine visit: Valorie Mcgrory Farrington-CMA Dr. Felecia Shelling     History of Present Illness: 43 year old female with history of hypertension, gout who is seen for an acute visit complaining of a gout flare in her left big toe which has been present for the last 3 to 4 days.  She complains of swelling, stiffness but no erythema or warmth.  This has not responded to the use of colchicine.  She endorses not adhering to a low purine eating plan lately, promises to get back on track. Of note she is on allopurinol for gout prophylaxis.   Past Medical History:  Diagnosis Date  . Anemia   . Gout   . Hyperlipidemia   . Hypertension    Allergies  Allergen Reactions  . Naproxen Shortness Of Breath    Current Outpatient Medications on File Prior to Visit  Medication Sig Dispense Refill  . allopurinol (ZYLOPRIM) 100 MG tablet TAKE 2 TABLETS (200 MG TOTAL) BY MOUTH DAILY. 180 tablet 0  . amLODipine (NORVASC) 5 MG tablet Take 1 tablet (5 mg total) by mouth daily. 90 tablet 3  . ferrous sulfate 325 (65 FE) MG tablet Take 1 tablet (325 mg total) by mouth 2 (two) times daily with a meal. 60 tablet 3  . gemfibrozil (LOPID) 600 MG tablet Take 1 tablet (600 mg total) by mouth 2 (two) times daily before a meal. 180 tablet 3  .  metoprolol succinate (TOPROL-XL) 25 MG 24 hr tablet Take 1 tablet (25 mg total) by mouth daily. 90 tablet 1  . Vitamin D, Ergocalciferol, (DRISDOL) 1.25 MG (50000 UT) CAPS capsule Take 1 capsule (50,000 Units total) by mouth every 7 (seven) days. 12 capsule 1  . colchicine 0.6 MG tablet Take 1 tablet (0.6 mg total) by mouth daily for 30 days. 15 tablet 0   No current facility-administered medications on file prior to visit.     Observations/Objective: Alert, awake, oriented x3 Not in acute distress  Assessment and Plan: 1. Acute gout of right foot, unspecified cause Counseled on low purine eating plan Continue Colchicine for acute flare and Allopurinol for prevention - predniSONE (DELTASONE) 20 MG tablet; Take 1 tablet (20 mg total) by mouth 2 (two) times daily with a meal.  Dispense: 10 tablet; Refill: 0   Follow Up Instructions: Keep previously scheduled appointment with PCP.   I discussed the assessment and treatment plan with the patient. The patient was provided an opportunity to ask questions and all were answered. The patient agreed with the plan and demonstrated an understanding of the instructions.   The patient was advised to call back or seek an in-person evaluation if the symptoms worsen or if the condition fails to improve as anticipated.     I provided 11 minutes total of non-face-to-face time during this encounter including median intraservice time, reviewing previous notes, labs, imaging, medications, management  and patient verbalized understanding.     Charlott Rakes, MD, FAAFP. [facility] [place], [place]   10/08/2018, 11:10 AM

## 2018-10-08 NOTE — Telephone Encounter (Signed)
1) Medication(s) Requested (by name): Prednisone  2) Pharmacy of Choice: [facility]  3) Special Requests:   Approved medications will be sent to the pharmacy, we will reach out if there is an issue.  Requests made after 3pm may not be addressed until the following business day!  If a patient is unsure of the name of the medication(s) please note and ask patient to call back when they are able to provide all info, do not send to responsible party until all information is available!

## 2018-10-08 NOTE — Progress Notes (Signed)
Patient has been called and DOB has been verified. Patient has been screened and transferred to PCP to start phone visit.  Patient is having a gout flare.

## 2018-10-19 MED FILL — METOPROLOL SUCCINATE ER 25: 25 | 30 days supply | Qty: 30 | Fill #1

## 2018-10-19 MED FILL — GEMFIBROZIL 600 MG TAB: 600 | 30 days supply | Qty: 60 | Fill #3

## 2018-10-22 ENCOUNTER — Encounter: Payer: Self-pay | Admitting: Nurse Practitioner

## 2018-10-22 ENCOUNTER — Other Ambulatory Visit: Payer: Self-pay | Admitting: Nurse Practitioner

## 2018-10-22 ENCOUNTER — Other Ambulatory Visit: Payer: Self-pay | Admitting: Family Medicine

## 2018-10-22 DIAGNOSIS — M109 Gout, unspecified: Secondary | ICD-10-CM

## 2018-10-22 NOTE — Telephone Encounter (Signed)
Patient refill request -

## 2018-10-23 ENCOUNTER — Other Ambulatory Visit: Payer: Self-pay | Admitting: Family Medicine

## 2018-10-23 DIAGNOSIS — M109 Gout, unspecified: Secondary | ICD-10-CM

## 2018-10-26 MED ORDER — PREDNISONE 20 MG PO TABS: 20.0000 mg | ORAL_TABLET | Freq: Two times a day (BID) | ORAL | 0 refills | Status: DC

## 2018-10-26 MED FILL — predniSONE 20 MG TABS: 20 | 5 days supply | Qty: 10 | Fill #0

## 2018-10-28 ENCOUNTER — Ambulatory Visit: Payer: Self-pay | Attending: Nurse Practitioner | Admitting: Physician Assistant

## 2018-10-28 ENCOUNTER — Other Ambulatory Visit: Payer: Self-pay

## 2018-10-28 DIAGNOSIS — M109 Gout, unspecified: Secondary | ICD-10-CM

## 2018-10-28 DIAGNOSIS — M10071 Idiopathic gout, right ankle and foot: Secondary | ICD-10-CM

## 2018-10-28 MED ORDER — PREDNISONE 20 MG PO TABS: 20.0000 mg | ORAL_TABLET | Freq: Two times a day (BID) | ORAL | 0 refills | Status: DC

## 2018-10-28 MED ORDER — ALLOPURINOL 100 MG PO TABS: 200.0000 mg | ORAL_TABLET | Freq: Every day | ORAL | 2 refills | Status: DC

## 2018-10-28 MED ORDER — COLCHICINE 0.6 MG PO TABS: 0.6000 mg | ORAL_TABLET | Freq: Every day | ORAL | 0 refills | Status: DC

## 2018-10-28 MED FILL — predniSONE 20 MG TABS: 20 | 5 days supply | Qty: 10 | Fill #0

## 2018-10-28 MED FILL — $COLCRYS 0.6 MG TABLET: 0.6 | 15 days supply | Qty: 15 | Fill #0

## 2018-10-28 MED FILL — ?ALLOPURINOL 100MG TABLET: 100 | 30 days supply | Qty: 60 | Fill #0

## 2018-10-28 NOTE — Progress Notes (Signed)
Patient ID: Rebecca Joseph, female   DOB: November 05, 1975, 43 y.o.   MRN: [id number] Virtual Visit via Telephone Note  I connected with Rebecca Joseph on 10/28/18 at 10:10 AM EDT by telephone and verified that I am speaking with the correct person using two identifiers.   I discussed the limitations, risks, security and privacy concerns of performing an evaluation and management service by telephone and the availability of in person appointments. I also discussed with the patient that there may be a patient responsible charge related to this service. The patient expressed understanding and agreed to proceed.  Patient location:  home My Location:  [place] office Persons on the call:  Me and the patient  History of Present Illness: Has occasional gout flares and would like to get a RF of prednisone to have on hand.  She had a flare up last week and took a few left over prednisone and it helped.  She admits to poor compliance with water intake and diet until she has a flare.  No blood sugar problems.  Has taken prednisone 1 other time in the last year.  Compliant with allopurinol.  Sometimes colchicine will help with a flare.  She would like that to have on hand too.    Observations/Objective:  A&Ox3   Assessment and Plan: 1. Gout of right foot, unspecified cause, unspecified chronicity - predniSONE (DELTASONE) 20 MG tablet; Take 1 tablet (20 mg total) by mouth 2 (two) times daily with a meal.  Dispense: 10 tablet; Refill: 0 - allopurinol (ZYLOPRIM) 100 MG tablet; Take 2 tablets (200 mg total) by mouth daily.  Dispense: 180 tablet; Refill: 2 - colchicine 0.6 MG tablet; Take 1 tablet (0.6 mg total) by mouth daily.  Dispense: 15 tablet; Refill: 0    Follow Up Instructions: 2 months with PCP   I discussed the assessment and treatment plan with the patient. The patient was provided an opportunity to ask questions and all were answered. The patient agreed with the plan and demonstrated an  understanding of the instructions.   The patient was advised to call back or seek an in-person evaluation if the symptoms worsen or if the condition fails to improve as anticipated.  I provided 9 minutes of non-face-to-face time during this encounter.   Freeman Caldron, PA-C

## 2018-11-10 ENCOUNTER — Ambulatory Visit: Payer: Medicaid Other | Admitting: Nurse Practitioner

## 2018-11-10 MED FILL — ?AMLODIPINE BESYLATE 5MG TA: 5 | 30 days supply | Qty: 30 | Fill #8

## 2018-11-16 MED FILL — METOPROLOL SUCCINATE ER 25: 25 | 30 days supply | Qty: 30 | Fill #2

## 2018-12-01 MED FILL — GEMFIBROZIL 600 MG TAB: 600 | 30 days supply | Qty: 60 | Fill #4

## 2018-12-07 MED FILL — ?ALLOPURINOL 100MG TABLET: 100 | 30 days supply | Qty: 60 | Fill #1

## 2018-12-09 MED FILL — ?AMLODIPINE BESYLATE 5MG TA: 5 | 30 days supply | Qty: 30 | Fill #9

## 2018-12-09 MED FILL — METOPROLOL SUCCINATE ER 25: 25 | 30 days supply | Qty: 30 | Fill #3

## 2019-01-04 MED FILL — ?AMLODIPINE BESYLATE 5MG TA: 5 | 30 days supply | Qty: 30 | Fill #10

## 2019-01-04 MED FILL — VIT D2 1.25 MG (50,000 UNIT: 1.25 MG | 84 days supply | Qty: 12 | Fill #2

## 2019-01-04 MED FILL — ?METOPROLOL SUCC ER 25MG TA: 25 | 30 days supply | Qty: 30 | Fill #4

## 2019-01-04 MED FILL — ?ALLOPURINOL 100MG TABLET: 100 | 30 days supply | Qty: 60 | Fill #2

## 2019-01-12 MED FILL — GEMFIBROZIL 600 MG TAB: 600 | 30 days supply | Qty: 60 | Fill #0

## 2019-02-05 MED FILL — ?ALLOPURINOL 100MG TABLET: 100 | 30 days supply | Qty: 60 | Fill #3

## 2019-02-09 MED FILL — ?METOPROLOL SUCC ER 25MG TA: 25 | 30 days supply | Qty: 30 | Fill #5

## 2019-02-09 MED FILL — ?AMLODIPINE BESYLATE 5MG TA: 5 | 30 days supply | Qty: 30 | Fill #11

## 2019-03-01 MED FILL — GEMFIBROZIL 600 MG TAB: 600 | 30 days supply | Qty: 60 | Fill #1

## 2019-03-08 ENCOUNTER — Other Ambulatory Visit: Payer: Self-pay | Admitting: Nurse Practitioner

## 2019-03-08 DIAGNOSIS — R000 Tachycardia, unspecified: Secondary | ICD-10-CM

## 2019-03-08 MED FILL — ?AMLODIPINE BESYLATE 5 MG T: 5 MG | 30 days supply | Qty: 30 | Fill #0

## 2019-03-08 MED FILL — ?ALLOPURINOL 100MG TABLET: 100 | 30 days supply | Qty: 60 | Fill #4

## 2019-03-08 NOTE — Telephone Encounter (Signed)
Please fill if appropriate. Last appointment was canceled 11/10/2018.

## 2019-03-09 MED FILL — ?METOPROLOL SUCC ER 25MG TA: 25 | 30 days supply | Qty: 30 | Fill #0

## 2019-03-10 IMAGING — MG DIGITAL SCREENING BILATERAL MAMMOGRAM WITH TOMO AND CAD
8 series · 8 of 24 positions shown · non-contrast
Comparison: Previous exam(s).

CLINICAL DATA: Screening.

EXAM:
DIGITAL SCREENING BILATERAL MAMMOGRAM WITH TOMO AND CAD

[R CC synth-2D]
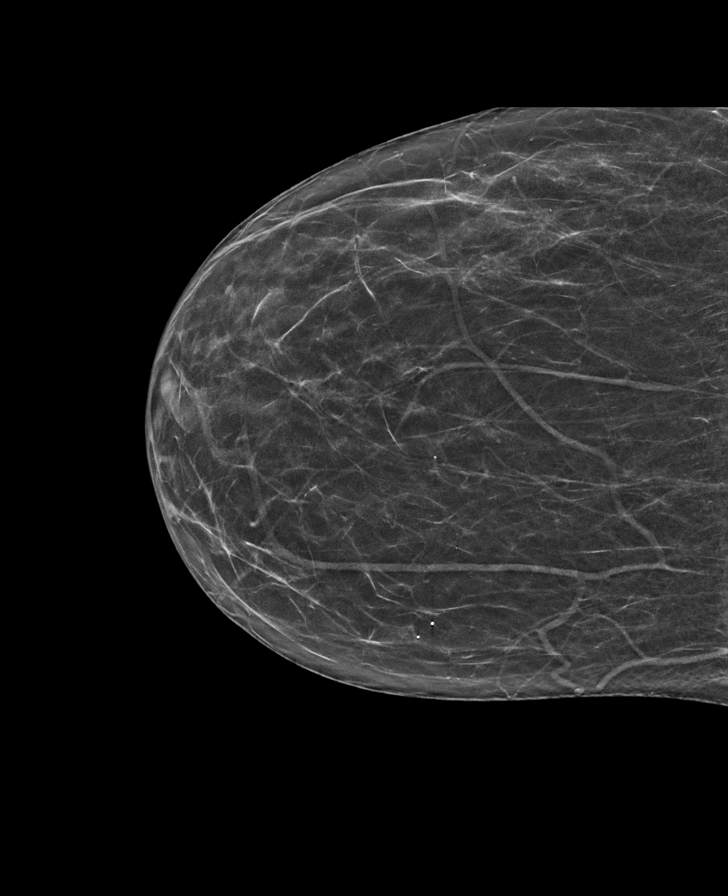

[L MLO synth-2D]
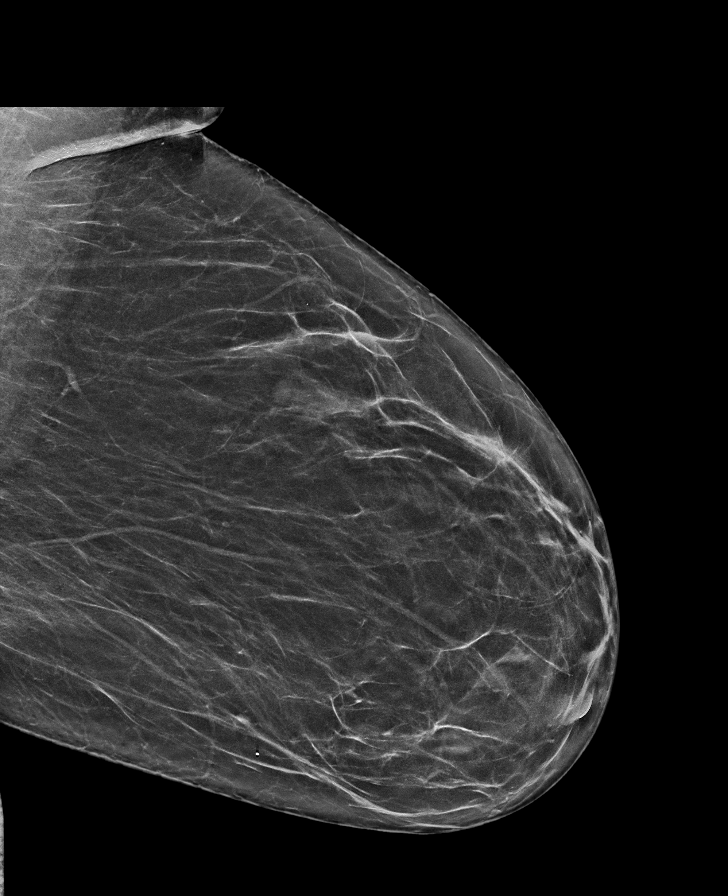

[L CC synth-2D]
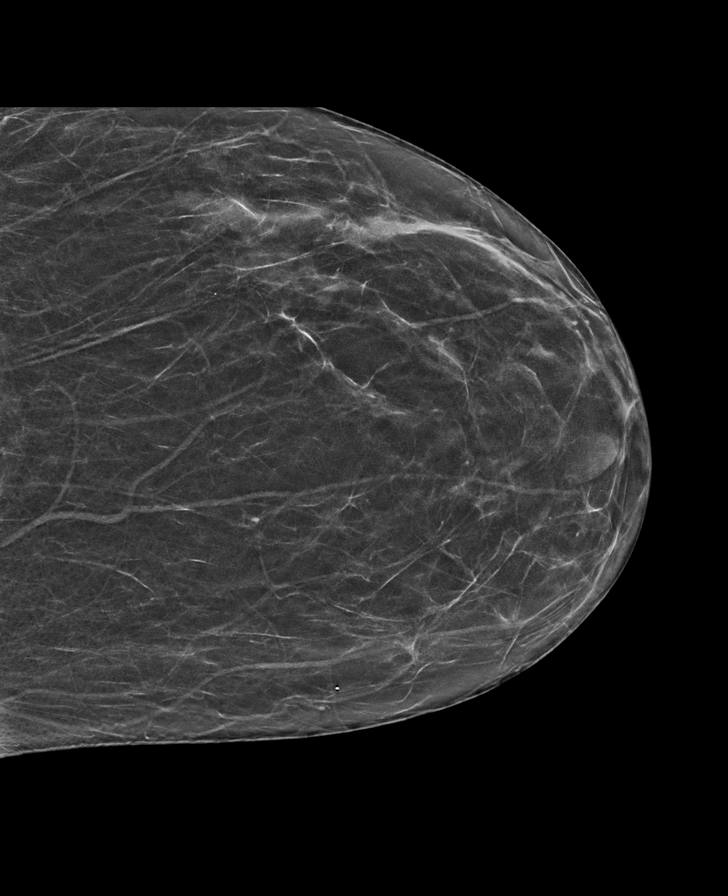

[R MLO synth-2D]
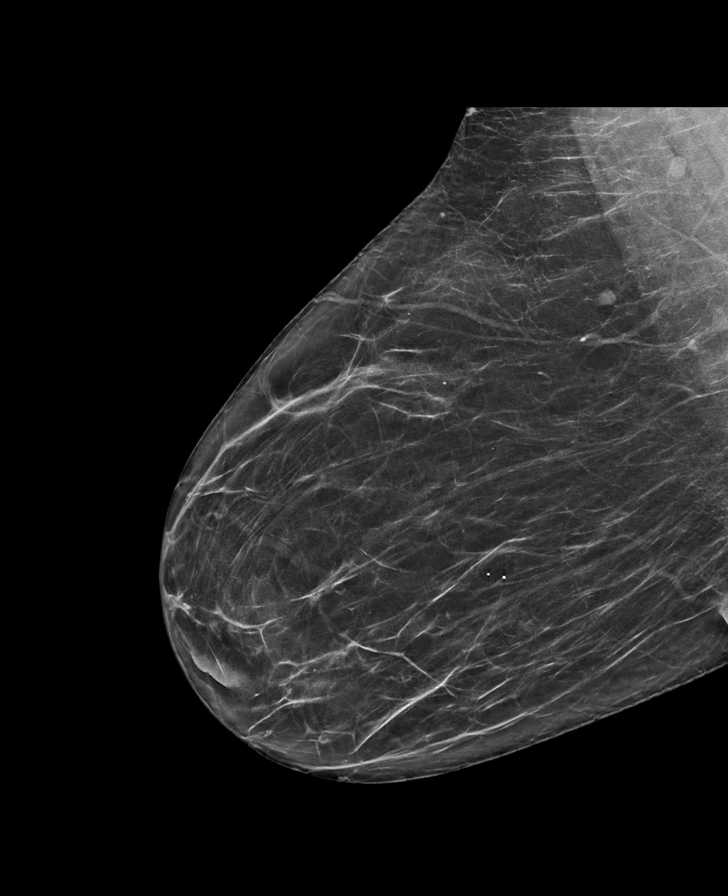

[R MLO tomo · tomo slice 36/71.0]
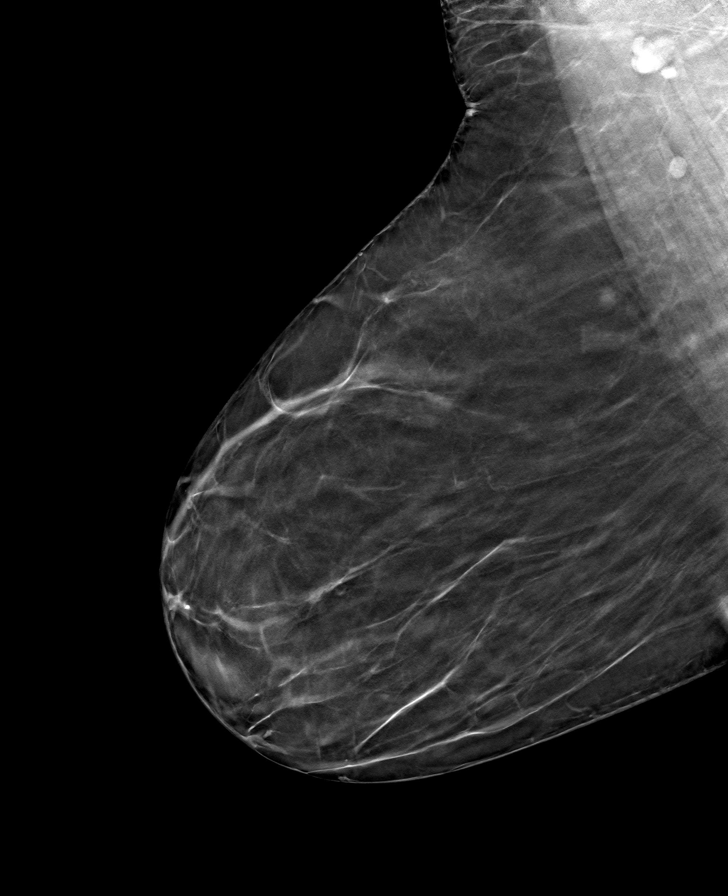

[L MLO tomo · tomo slice 35/70.0]
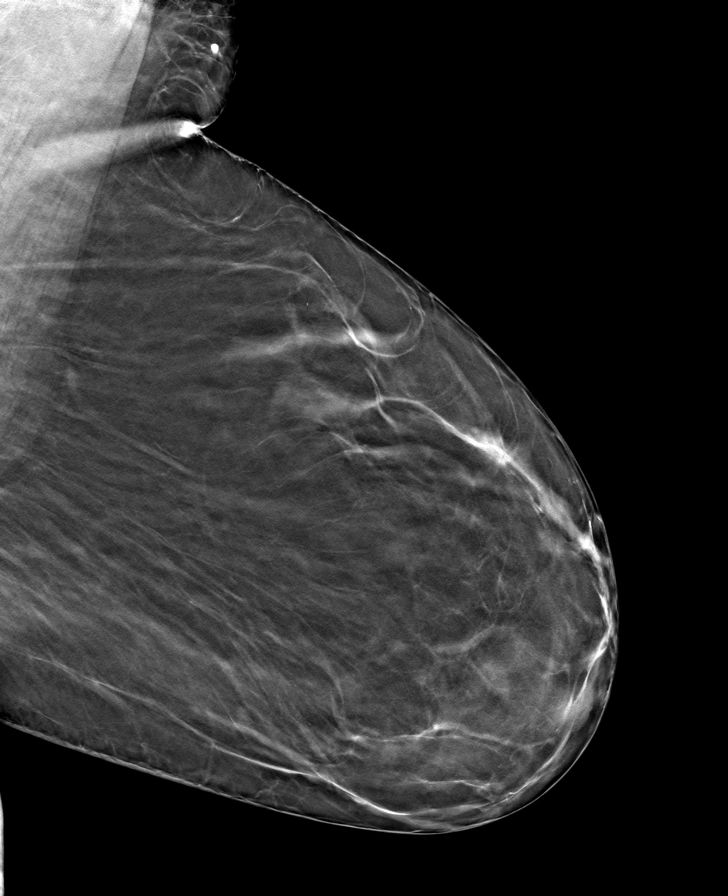

[L CC tomo · tomo slice 31/62.0]
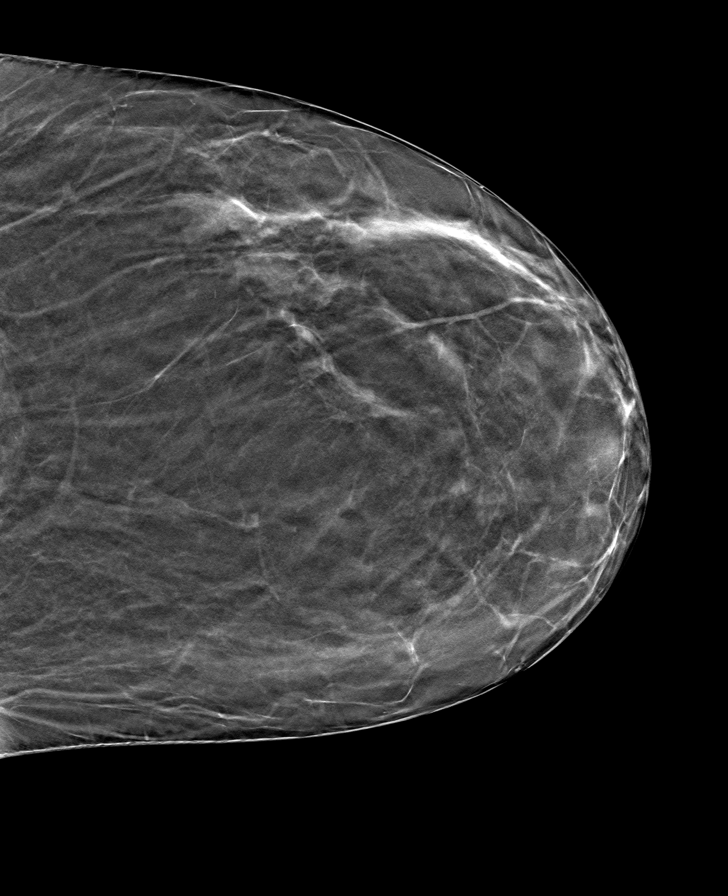

[R CC tomo · tomo slice 29/58.0]
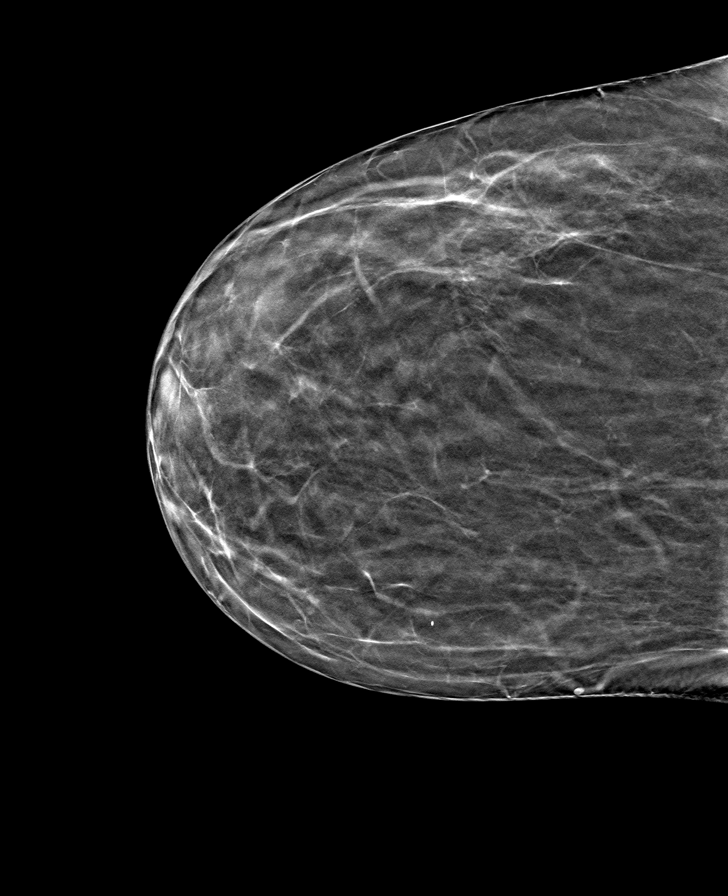

[8 of 24 positions shown; findings below may reference images not displayed]

ACR Breast Density Category b: There are scattered areas of
fibroglandular density.
FINDINGS: There are no findings suspicious for malignancy. Images were
processed with CAD.
IMPRESSION: No mammographic evidence of malignancy. A result letter of this
screening mammogram will be mailed directly to the patient.

RECOMMENDATION:
Screening mammogram in one year. (Code:[id number])

BI-RADS CATEGORY  1: Negative.

## 2019-04-08 ENCOUNTER — Other Ambulatory Visit: Payer: Self-pay | Admitting: Nurse Practitioner

## 2019-04-08 DIAGNOSIS — E559 Vitamin D deficiency, unspecified: Secondary | ICD-10-CM

## 2019-04-08 MED FILL — METOPROLOL SUCCINATE ER 25: 25 | 30 days supply | Qty: 30 | Fill #1

## 2019-04-08 MED FILL — AMLODIPINE BESYLATE 5 MG TA: 5 | 30 days supply | Qty: 30 | Fill #1

## 2019-04-08 MED FILL — ?ALLOPURINOL 100MG TABLET: 100 | 30 days supply | Qty: 60 | Fill #5

## 2019-04-12 ENCOUNTER — Ambulatory Visit: Payer: Medicaid Other | Attending: Internal Medicine

## 2019-04-12 DIAGNOSIS — Z20822 Contact with and (suspected) exposure to covid-19: Secondary | ICD-10-CM

## 2019-04-13 LAB — NOVEL CORONAVIRUS, NAA: SARS-CoV-2, NAA: NOT DETECTED

## 2019-04-23 ENCOUNTER — Other Ambulatory Visit: Payer: Self-pay

## 2019-04-23 ENCOUNTER — Ambulatory Visit: Payer: Self-pay | Attending: Nurse Practitioner | Admitting: Nurse Practitioner

## 2019-04-23 VITALS — BP 116/62 | HR 81

## 2019-04-23 DIAGNOSIS — E783 Hyperchylomicronemia: Secondary | ICD-10-CM | POA: Insufficient documentation

## 2019-04-23 DIAGNOSIS — M10071 Idiopathic gout, right ankle and foot: Secondary | ICD-10-CM

## 2019-04-23 DIAGNOSIS — Z79899 Other long term (current) drug therapy: Secondary | ICD-10-CM | POA: Insufficient documentation

## 2019-04-23 DIAGNOSIS — I10 Essential (primary) hypertension: Secondary | ICD-10-CM | POA: Insufficient documentation

## 2019-04-23 DIAGNOSIS — E559 Vitamin D deficiency, unspecified: Secondary | ICD-10-CM | POA: Insufficient documentation

## 2019-04-23 DIAGNOSIS — Z833 Family history of diabetes mellitus: Secondary | ICD-10-CM | POA: Insufficient documentation

## 2019-04-23 DIAGNOSIS — R000 Tachycardia, unspecified: Secondary | ICD-10-CM | POA: Insufficient documentation

## 2019-04-23 DIAGNOSIS — E785 Hyperlipidemia, unspecified: Secondary | ICD-10-CM | POA: Insufficient documentation

## 2019-04-23 DIAGNOSIS — M109 Gout, unspecified: Secondary | ICD-10-CM | POA: Insufficient documentation

## 2019-04-23 DIAGNOSIS — Z8249 Family history of ischemic heart disease and other diseases of the circulatory system: Secondary | ICD-10-CM | POA: Insufficient documentation

## 2019-04-23 DIAGNOSIS — F5102 Adjustment insomnia: Secondary | ICD-10-CM | POA: Insufficient documentation

## 2019-04-23 DIAGNOSIS — Z87891 Personal history of nicotine dependence: Secondary | ICD-10-CM | POA: Insufficient documentation

## 2019-04-23 DIAGNOSIS — D500 Iron deficiency anemia secondary to blood loss (chronic): Secondary | ICD-10-CM | POA: Insufficient documentation

## 2019-04-23 MED ORDER — ALLOPURINOL 100 MG PO TABS: 200.0000 mg | ORAL_TABLET | Freq: Every day | ORAL | 2 refills | Status: DC

## 2019-04-23 MED ORDER — AMLODIPINE BESYLATE 5 MG PO TABS: 5.0000 mg | ORAL_TABLET | Freq: Every day | ORAL | 3 refills | Status: DC

## 2019-04-23 MED ORDER — METOPROLOL SUCCINATE ER 25 MG PO TB24: 25.0000 mg | ORAL_TABLET | Freq: Every day | ORAL | 0 refills | Status: DC

## 2019-04-23 MED ORDER — VITAMIN D (ERGOCALCIFEROL) 1.25 MG (50000 UNIT) PO CAPS: 50000.0000 [IU] | ORAL_CAPSULE | ORAL | 1 refills | Status: DC

## 2019-04-23 MED ORDER — GEMFIBROZIL 600 MG PO TABS: 600.0000 mg | ORAL_TABLET | Freq: Two times a day (BID) | ORAL | 3 refills | Status: DC

## 2019-04-23 MED ORDER — FERROUS SULFATE 325 (65 FE) MG PO TABS: 325.0000 mg | ORAL_TABLET | Freq: Two times a day (BID) | ORAL | 3 refills | Status: DC

## 2019-04-23 MED ORDER — COLCHICINE 0.6 MG PO TABS: 0.6000 mg | ORAL_TABLET | Freq: Every day | ORAL | 0 refills | Status: DC | PRN

## 2019-04-23 MED FILL — FERROUS SULFATE 325 MG TAB: 325 (65 FE) | 30 days supply | Qty: 60 | Fill #0

## 2019-04-23 MED FILL — $COLCRYS 0.6 MG TABLET: 0.6 | 15 days supply | Qty: 15 | Fill #0

## 2019-04-23 MED FILL — ?ERGOCALCIFEROL 50000 UNITC: 1.25 MG | 84 days supply | Qty: 12 | Fill #0

## 2019-04-23 MED FILL — GEMFIBROZIL 600 MG TAB: 600 | 30 days supply | Qty: 60 | Fill #0

## 2019-04-23 NOTE — Progress Notes (Signed)
Virtual Visit via Telephone Note Due to national recommendations of social distancing due to [place] 19, telehealth visit is felt to be most appropriate for this patient at this time.  I discussed the limitations, risks, security and privacy concerns of performing an evaluation and management service by telephone and the availability of in person appointments. I also discussed with the patient that there may be a patient responsible charge related to this service. The patient expressed understanding and agreed to proceed.    I connected with Rebecca Joseph on 04/23/19  at  11:10 AM EST  EDT by telephone and verified that I am speaking with the correct person using two identifiers.   Consent I discussed the limitations, risks, security and privacy concerns of performing an evaluation and management service by telephone and the availability of in person appointments. I also discussed with the patient that there may be a patient responsible charge related to this service. The patient expressed understanding and agreed to proceed.   Location of Patient: Private Residence   Location of Provider: [place] and [employer] Office    Persons participating in Telemedicine visit: [name] FNP-BC [place]    History of Present Illness: Telemedicine visit for: Follow up  has a past medical history of Anemia, Gout, Hyperlipidemia, and Hypertension.    Insomnia Has been working split shifts lately and when she gets off at night from second shift it takes several hours for her to fall asleep.  GOUT Denies any symptoms of Gout Flare. Continues on allopurinol 100 mg daily and colchicine as needed.   Essential Hypertension Well controlled at home. She checked her blood pressure over the phone and today's reading was 116/62 with HR 81. Denies chest pain, shortness of breath, palpitations, lightheadedness, dizziness, headaches or BLE edema. Taking amlodipine  5 mg daily and Toprol XL 25 mg daily as prescribed. She takes metoprolol for tachycardia.  BP Readings from Last 3 Encounters:  04/23/19 116/62  04/25/18 (!) 131/94  04/13/18 117/76    Past Medical History:  Diagnosis Date  . Anemia   . Gout   . Hyperlipidemia   . Hypertension     Past Surgical History:  Procedure Laterality Date  . CESAREAN SECTION    . TUBAL LIGATION      Family History  Problem Relation Age of Onset  . Diabetes Father   . Hypertension Father   . Heart disease Father        CHF  . Gout Father   . Hypertension Mother   . Breast cancer Maternal Aunt   . Cancer Maternal Aunt   . Breast cancer Cousin   . Cancer Cousin        Maternal Cousin died from breast cancer    Social History   Socioeconomic History  . Marital status: Legally Separated    Spouse name: Not on file  . Number of children: Not on file  . Years of education: Not on file  . Highest education level: Not on file  Occupational History  . Not on file  Tobacco Use  . Smoking status: Former Research scientist (life sciences)  . Smokeless tobacco: Never Used  Substance and Sexual Activity  . Alcohol use: Yes    Comment: occasionally  . Drug use: No  . Sexual activity: Yes    Birth control/protection: Surgical  Other Topics Concern  . Not on file  Social History Narrative  . Not on file   Social Determinants of Health  Financial Resource Strain:   . Difficulty of Paying Living Expenses: Not on file  Food Insecurity:   . Worried About Charity fundraiser in the Last Year: Not on file  . Ran Out of Food in the Last Year: Not on file  Transportation Needs:   . Lack of Transportation (Medical): Not on file  . Lack of Transportation (Non-Medical): Not on file  Physical Activity:   . Days of Exercise per Week: Not on file  . Minutes of Exercise per Session: Not on file  Stress:   . Feeling of Stress : Not on file  Social Connections:   . Frequency of Communication with Friends and Family: Not on file   . Frequency of Social Gatherings with Friends and Family: Not on file  . Attends Religious Services: Not on file  . Active Member of Clubs or Organizations: Not on file  . Attends Archivist Meetings: Not on file  . Marital Status: Not on file     Observations/Objective: Awake, alert and oriented x 3   Review of Systems  Constitutional: Negative for fever, malaise/fatigue and weight loss.  HENT: Negative.  Negative for nosebleeds.   Eyes: Negative.  Negative for blurred vision, double vision and photophobia.  Respiratory: Negative.  Negative for cough and shortness of breath.   Cardiovascular: Negative.  Negative for chest pain, palpitations and leg swelling.  Gastrointestinal: Negative.  Negative for heartburn, nausea and vomiting.  Musculoskeletal: Negative.  Negative for myalgias.  Neurological: Negative.  Negative for dizziness, focal weakness, seizures and headaches.  Psychiatric/Behavioral: Negative for suicidal ideas. The patient has insomnia.     Assessment and Plan: Rebecca Joseph was seen today for follow-up.  Diagnoses and all orders for this visit:  Adjustment insomnia I recommended avoiding stimulation: TV, electronics, Phone, bright lights, Noise, music. Sleepy time tea. Reading a book with low lights to encourage sleep. She can also take tylenol PM or melatonin up to 56m at night to help her sleep. We also discussed the importance of regular shift work and how split shifts can affect sleep cycles and health.   Gout of right foot, unspecified cause, unspecified chronicity -     allopurinol (ZYLOPRIM) 100 MG tablet; Take 2 tablets (200 mg total) by mouth daily. -     colchicine 0.6 MG tablet; Take 1 tablet (0.6 mg total) by mouth daily as needed.  Essential hypertension -     amLODipine (NORVASC) 5 MG tablet; Take 1 tablet (5 mg total) by mouth daily. -     CMP14+EGFR; Future Continue all antihypertensives as prescribed.  Remember to bring in your blood  pressure log with you for your follow up appointment.  DASH/Mediterranean Diets are healthier choices for HTN.    Iron deficiency anemia due to chronic blood loss -     ferrous sulfate 325 (65 FE) MG tablet; Take 1 tablet (325 mg total) by mouth 2 (two) times daily with a meal. -     CBC; Future  Mixed hyperglyceridemia -     gemfibrozil (LOPID) 600 MG tablet; Take 1 tablet (600 mg total) by mouth 2 (two) times daily before a meal. -     Lipid Panel; Future WELL CONTROLLED. LDL at goal of <100 Lab Results  Component Value Date   LDLCALC 57 08/06/2018    Tachycardia -     metoprolol succinate (TOPROL-XL) 25 MG 24 hr tablet; Take 1 tablet (25 mg total) by mouth daily. Controleed.   Vitamin D deficiency  disease -     Vitamin D, Ergocalciferol, (DRISDOL) 1.25 MG (50000 UNIT) CAPS capsule; Take 1 capsule (50,000 Units total) by mouth every 7 (seven) days. -     Vitamin D level; Future     Follow Up Instructions Return in about 3 months (around 07/22/2019).     I discussed the assessment and treatment plan with the patient. The patient was provided an opportunity to ask questions and all were answered. The patient agreed with the plan and demonstrated an understanding of the instructions.   The patient was advised to call back or seek an in-person evaluation if the symptoms worsen or if the condition fails to improve as anticipated.  I provided 16 minutes of non-face-to-face time during this encounter including median intraservice time, reviewing previous notes, labs, imaging, medications and explaining diagnosis and management.  [name], FNP-BC

## 2019-04-26 ENCOUNTER — Encounter: Payer: Self-pay | Admitting: Nurse Practitioner

## 2019-04-27 ENCOUNTER — Other Ambulatory Visit: Payer: Self-pay

## 2019-04-27 ENCOUNTER — Ambulatory Visit: Payer: Self-pay | Attending: Nurse Practitioner

## 2019-04-27 DIAGNOSIS — I10 Essential (primary) hypertension: Secondary | ICD-10-CM

## 2019-04-27 DIAGNOSIS — E559 Vitamin D deficiency, unspecified: Secondary | ICD-10-CM

## 2019-04-27 DIAGNOSIS — E783 Hyperchylomicronemia: Secondary | ICD-10-CM

## 2019-04-27 DIAGNOSIS — D500 Iron deficiency anemia secondary to blood loss (chronic): Secondary | ICD-10-CM

## 2019-04-28 ENCOUNTER — Other Ambulatory Visit: Payer: Self-pay | Admitting: Nurse Practitioner

## 2019-04-28 DIAGNOSIS — D269 Other benign neoplasm of uterus, unspecified: Secondary | ICD-10-CM

## 2019-04-28 DIAGNOSIS — Z8742 Personal history of other diseases of the female genital tract: Secondary | ICD-10-CM

## 2019-04-28 DIAGNOSIS — D500 Iron deficiency anemia secondary to blood loss (chronic): Secondary | ICD-10-CM

## 2019-04-28 LAB — CMP14+EGFR
ALT: 9 IU/L (ref 0–32)
AST: 16 IU/L (ref 0–40)
Albumin/Globulin Ratio: 1.6 (ref 1.2–2.2)
Albumin: 4.4 g/dL (ref 3.8–4.8)
Alkaline Phosphatase: 107 IU/L (ref 39–117)
BUN/Creatinine Ratio: 18 (ref 9–23)
BUN: 13 mg/dL (ref 6–24)
Bilirubin Total: 0.3 mg/dL (ref 0.0–1.2)
CO2: 19 mmol/L — ABNORMAL LOW (ref 20–29)
Calcium: 9.8 mg/dL (ref 8.7–10.2)
Chloride: 103 mmol/L (ref 96–106)
Creatinine, Ser: 0.71 mg/dL (ref 0.57–1.00)
GFR calc Af Amer: 121 mL/min/{1.73_m2} (ref >59)
GFR calc non Af Amer: 105 mL/min/{1.73_m2} (ref >59)
Globulin, Total: 2.7 g/dL (ref 1.5–4.5)
Glucose: 94 mg/dL (ref 65–99)
Potassium: 4.2 mmol/L (ref 3.5–5.2)
Sodium: 140 mmol/L (ref 134–144)
Total Protein: 7.1 g/dL (ref 6.0–8.5)

## 2019-04-28 LAB — CBC
Hematocrit: 23 % — ABNORMAL LOW (ref 34.0–46.6)
Hemoglobin: 6.3 g/dL — CL (ref 11.1–15.9)
MCH: 15.6 pg — ABNORMAL LOW (ref 26.6–33.0)
MCHC: 27.4 g/dL — ABNORMAL LOW (ref 31.5–35.7)
MCV: 57 fL — ABNORMAL LOW (ref 79–97)
Platelets: 378 10*3/uL (ref 150–450)
RBC: 4.05 x10E6/uL (ref 3.77–5.28)
RDW: 21.3 % — ABNORMAL HIGH (ref 11.7–15.4)
WBC: 6.8 10*3/uL (ref 3.4–10.8)

## 2019-04-28 LAB — VITAMIN D 25 HYDROXY (VIT D DEFICIENCY, FRACTURES): Vit D, 25-Hydroxy: 25 ng/mL — ABNORMAL LOW (ref 30.0–100.0)

## 2019-04-28 LAB — LIPID PANEL
Chol/HDL Ratio: 2.7 ratio (ref 0.0–4.4)
Cholesterol, Total: 129 mg/dL (ref 100–199)
HDL: 47 mg/dL (ref >39)
LDL Chol Calc (NIH): 69 mg/dL (ref 0–99)
Triglycerides: 58 mg/dL (ref 0–149)
VLDL Cholesterol Cal: 13 mg/dL (ref 5–40)

## 2019-05-04 ENCOUNTER — Encounter: Payer: Self-pay | Admitting: Nurse Practitioner

## 2019-05-05 ENCOUNTER — Telehealth: Payer: Self-pay

## 2019-05-05 ENCOUNTER — Encounter: Payer: Self-pay | Admitting: Nurse Practitioner

## 2019-05-05 ENCOUNTER — Other Ambulatory Visit: Payer: Self-pay | Admitting: Nurse Practitioner

## 2019-05-05 MED ORDER — SODIUM CHLORIDE 0.9 % IV SOLN: 510.0000 mg | Freq: Once | INTRAVENOUS | 0 refills | Status: AC

## 2019-05-05 MED FILL — AMLODIPINE BESYLATE 5 MG TA: 5 | 30 days supply | Qty: 30 | Fill #2

## 2019-05-05 NOTE — Telephone Encounter (Addendum)
Patient is scheduled at the Patient Rebecca Joseph for a Feraheme infusion on 05/10/2019 @ 10 AM.  ferumoxytol (FERAHEME) 510 mg in sodium chloride 0.9 % 100 mL the bolded is the order for the infusion. I wasn't able to pend it.

## 2019-05-05 NOTE — Telephone Encounter (Signed)
Patients Gyn Appointment is 06/07/19 Patients cycle will start next week and she is worried about the loss of blood and inquiring about the transfusion.

## 2019-05-10 ENCOUNTER — Ambulatory Visit (HOSPITAL_COMMUNITY)
Admission: RE | Admit: 2019-05-10 | Discharge: 2019-05-10 | Disposition: A | Discharge status: Home / Self Care | Payer: Medicaid Other | Source: Ambulatory Visit | Attending: Internal Medicine | Admitting: Internal Medicine

## 2019-05-10 DIAGNOSIS — D500 Iron deficiency anemia secondary to blood loss (chronic): Secondary | ICD-10-CM | POA: Insufficient documentation

## 2019-05-10 MED ORDER — SODIUM CHLORIDE 0.9 % IV SOLN
INTRAVENOUS | Status: DC | PRN
  Administered 2019-05-10: 11:00:00 250 mL via INTRAVENOUS

## 2019-05-10 MED ORDER — SODIUM CHLORIDE 0.9 % IV SOLN
510.0000 mg | Freq: Once | INTRAVENOUS | Status: AC
  Administered 2019-05-10: 11:00:00 510 mg via INTRAVENOUS
  Filled 2019-05-10: qty 17

## 2019-05-10 NOTE — Progress Notes (Signed)
PATIENT CARE CENTER NOTE  Diagnosis: Iron Deficiency Anemia    Provider: [name], NP   Procedure: IV Feraheme   Note: Patient received Feraheme infusion via PIV. Tolerated well with no adverse reaction. Vital signs stable. Observed patient for 30 minutes post-infusion. Discharge instructions given. Alert, oriented and ambulatory at discharge.

## 2019-05-10 NOTE — Discharge Instructions (Signed)

## 2019-05-11 MED FILL — METOPROLOL SUCCINATE ER 25: 25 | 30 days supply | Qty: 30 | Fill #2

## 2019-05-21 MED FILL — ?ALLOPURINOL 100MG TABLET: 100 | 30 days supply | Qty: 60 | Fill #6

## 2019-05-21 MED FILL — GEMFIBROZIL 600 MG TAB: 600 | 30 days supply | Qty: 60 | Fill #1

## 2019-05-24 ENCOUNTER — Ambulatory Visit: Payer: Medicaid Other | Attending: Internal Medicine

## 2019-05-24 DIAGNOSIS — Z20822 Contact with and (suspected) exposure to covid-19: Secondary | ICD-10-CM

## 2019-05-25 LAB — NOVEL CORONAVIRUS, NAA: SARS-CoV-2, NAA: NOT DETECTED

## 2019-06-07 ENCOUNTER — Ambulatory Visit (INDEPENDENT_AMBULATORY_CARE_PROVIDER_SITE_OTHER): Payer: Medicaid Other | Admitting: Family Medicine

## 2019-06-07 ENCOUNTER — Encounter: Payer: Self-pay | Admitting: Family Medicine

## 2019-06-07 ENCOUNTER — Other Ambulatory Visit: Payer: Self-pay

## 2019-06-07 VITALS — BP 126/81 | HR 97 | Ht 65.0 in | Wt 178.0 lb

## 2019-06-07 DIAGNOSIS — Z30430 Encounter for insertion of intrauterine contraceptive device: Secondary | ICD-10-CM | POA: Diagnosis not present

## 2019-06-07 DIAGNOSIS — N920 Excessive and frequent menstruation with regular cycle: Secondary | ICD-10-CM | POA: Diagnosis not present

## 2019-06-07 DIAGNOSIS — N800 Endometriosis of uterus: Secondary | ICD-10-CM | POA: Diagnosis not present

## 2019-06-07 DIAGNOSIS — N8003 Adenomyosis of the uterus: Secondary | ICD-10-CM

## 2019-06-07 MED ORDER — LEVONORGESTREL 19.5 MCG/DAY IU IUD
INTRAUTERINE_SYSTEM | Freq: Once | INTRAUTERINE | Status: AC
  Administered 2019-06-07: 1 via INTRAUTERINE

## 2019-06-07 NOTE — Assessment & Plan Note (Signed)
For IUD which should help. 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 Has h/o 2 prior C-sections. Risks of hyst discussed.

## 2019-06-07 NOTE — Patient Instructions (Signed)
Levonorgestrel intrauterine device (IUD) What is this medicine? LEVONORGESTREL IUD (LEE voe nor jes trel) is a contraceptive (birth control) device. The device is placed inside the uterus by a healthcare professional. It is used to prevent pregnancy. This device can also be used to treat heavy bleeding that occurs during your period. This medicine may be used for other purposes; ask your health care provider or pharmacist if you have questions. COMMON BRAND NAME(S): Kyleena, LILETTA, Mirena, Skyla What should I tell my health care provider before I take this medicine? They need to know if you have any of these conditions:  abnormal Pap smear  cancer of the breast, uterus, or cervix  diabetes  endometritis  genital or pelvic infection now or in the past  have more than one sexual partner or your partner has more than one partner  heart disease  history of an ectopic or tubal pregnancy  immune system problems  IUD in place  liver disease or tumor  problems with blood clots or take blood-thinners  seizures  use intravenous drugs  uterus of unusual shape  vaginal bleeding that has not been explained  an unusual or allergic reaction to levonorgestrel, other hormones, silicone, or polyethylene, medicines, foods, dyes, or preservatives  pregnant or trying to get pregnant  breast-feeding How should I use this medicine? This device is placed inside the uterus by a health care professional. Talk to your pediatrician regarding the use of this medicine in children. Special care may be needed. Overdosage: If you think you have taken too much of this medicine contact a poison control center or emergency room at once. NOTE: This medicine is only for you. Do not share this medicine with others. What if I miss a dose? This does not apply. Depending on the brand of device you have inserted, the device will need to be replaced every 3 to 6 years if you wish to continue using this type  of birth control. What may interact with this medicine? Do not take this medicine with any of the following medications:  amprenavir  bosentan  fosamprenavir This medicine may also interact with the following medications:  aprepitant  armodafinil  barbiturate medicines for inducing sleep or treating seizures  bexarotene  boceprevir  griseofulvin  medicines to treat seizures like carbamazepine, ethotoin, felbamate, oxcarbazepine, phenytoin, topiramate  modafinil  pioglitazone  rifabutin  rifampin  rifapentine  some medicines to treat HIV infection like atazanavir, efavirenz, indinavir, lopinavir, nelfinavir, tipranavir, ritonavir  St. John's wort  warfarin This list may not describe all possible interactions. Give your health care provider a list of all the medicines, herbs, non-prescription drugs, or dietary supplements you use. Also tell them if you smoke, drink alcohol, or use illegal drugs. Some items may interact with your medicine. What should I watch for while using this medicine? Visit your doctor or health care professional for regular check ups. See your doctor if you or your partner has sexual contact with others, becomes HIV positive, or gets a sexual transmitted disease. This product does not protect you against HIV infection (AIDS) or other sexually transmitted diseases. You can check the placement of the IUD yourself by reaching up to the top of your vagina with clean fingers to feel the threads. Do not pull on the threads. It is a good habit to check placement after each menstrual period. Call your doctor right away if you feel more of the IUD than just the threads or if you cannot feel the threads at   all. The IUD may come out by itself. You may become pregnant if the device comes out. If you notice that the IUD has come out use a backup birth control method like condoms and call your health care provider. Using tampons will not change the position of the  IUD and are okay to use during your period. This IUD can be safely scanned with magnetic resonance imaging (MRI) only under specific conditions. Before you have an MRI, tell your healthcare provider that you have an IUD in place, and which type of IUD you have in place. What side effects may I notice from receiving this medicine? Side effects that you should report to your doctor or health care professional as soon as possible:  allergic reactions like skin rash, itching or hives, swelling of the face, lips, or tongue  fever, flu-like symptoms  genital sores  high blood pressure  no menstrual period for 6 weeks during use  pain, swelling, warmth in the leg  pelvic pain or tenderness  severe or sudden headache  signs of pregnancy  stomach cramping  sudden shortness of breath  trouble with balance, talking, or walking  unusual vaginal bleeding, discharge  yellowing of the eyes or skin Side effects that usually do not require medical attention (report to your doctor or health care professional if they continue or are bothersome):  acne  breast pain  change in sex drive or performance  changes in weight  cramping, dizziness, or faintness while the device is being inserted  headache  irregular menstrual bleeding within first 3 to 6 months of use  nausea This list may not describe all possible side effects. Call your doctor for medical advice about side effects. You may report side effects to FDA at 1-800-FDA-1088. Where should I keep my medicine? This does not apply. NOTE: This sheet is a summary. It may not cover all possible information. If you have questions about this medicine, talk to your doctor, pharmacist, or health care provider.  2020 Elsevier/Gold Standard (2018-02-03 13:22:01)  

## 2019-06-07 NOTE — Progress Notes (Signed)
   Subjective:    Patient ID: Rebecca Joseph is a 44 y.o. female presenting with Referral (heavy bleeding, pain with intercourse)  on 06/07/2019  HPI: Notes cycles are monthly. Has a lot of cramps x 1 wk + spotting, then has really heavy cycles x 2 weeks total. Has had iron infusion and severe anemia. Desires hysterectomy. Previous attempt at endometrial ablation, failed due to uterine perforation.  Review of Systems  Constitutional: Positive for fatigue. Negative for chills and fever.  Respiratory: Negative for shortness of breath.   Cardiovascular: Negative for chest pain.  Gastrointestinal: Negative for abdominal pain, nausea and vomiting.  Genitourinary: Negative for dysuria.  Skin: Negative for rash.      Objective:    BP 126/81   Pulse 97   Ht 5\' 5"  (1.651 m)   Wt 178 lb (80.7 kg)   LMP 05/08/2019 (Within Days)   BMI 29.62 kg/m  Physical Exam Constitutional:      General: She is not in acute distress.    Appearance: She is well-developed.  HENT:     Head: Normocephalic and atraumatic.  Eyes:     General: No scleral icterus. Cardiovascular:     Rate and Rhythm: Normal rate.  Pulmonary:     Effort: Pulmonary effort is normal.  Abdominal:     Palpations: Abdomen is soft.  Musculoskeletal:     Cervical back: Neck supple.  Skin:    General: Skin is warm and dry.  Neurological:     Mental Status: She is alert and oriented to person, place, and time.    Procedure: Patient identified, informed consent performed, signed copy in chart, time out was performed.  Urine pregnancy test negative.  Speculum placed in the vagina.  Cervix visualized.  Cleaned with Betadine x 2.  Grasped anteriourly with a single tooth tenaculum.  Uterus sounded to 8 cm.  Liletta IUD placed per manufacturer's recommendations.  Strings trimmed to 3 cm.   Patient given post procedure instructions and Lilietta care card with expiration date.       Assessment & Plan:   Problem List  Items Addressed This Visit      Unprioritized   Adenomyosis - Primary   Menorrhagia    For IUD which should help. 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 Has h/o 2 prior C-sections. Risks of hyst discussed.       Other Visit Diagnoses    Encounter for IUD insertion           Total face-to-face time with patient: 22 minutes. Over 50% of encounter was spent on counseling and coordination of care. Return in about 4 weeks (around 07/05/2019) for in person, iud check + pap smear if needed.  [name] 06/07/2019 11:02 AM

## 2019-06-10 MED FILL — AMLODIPINE BESYLATE 5 MG TA: 5 | 30 days supply | Qty: 30 | Fill #3

## 2019-06-10 MED FILL — METOPROLOL SUCCINATE ER 25: 25 | 30 days supply | Qty: 30 | Fill #0

## 2019-06-14 ENCOUNTER — Other Ambulatory Visit: Payer: Self-pay

## 2019-06-14 DIAGNOSIS — Z1231 Encounter for screening mammogram for malignant neoplasm of breast: Secondary | ICD-10-CM

## 2019-06-30 MED FILL — ?ALLOPURINOL 100MG TABLET: 100 | 30 days supply | Qty: 60 | Fill #7

## 2019-07-12 MED FILL — METOPROLOL SUCCINATE ER 25: 25 | 30 days supply | Qty: 30 | Fill #1

## 2019-07-12 MED FILL — AMLODIPINE BESYLATE 5 MG TA: 5 | 30 days supply | Qty: 30 | Fill #4

## 2019-07-12 MED FILL — GEMFIBROZIL 600 MG TAB: 600 | 30 days supply | Qty: 60 | Fill #2

## 2019-07-13 ENCOUNTER — Ambulatory Visit: Payer: Self-pay | Admitting: Student

## 2019-07-13 ENCOUNTER — Ambulatory Visit
Admission: RE | Admit: 2019-07-13 | Discharge: 2019-07-13 | Disposition: A | Discharge status: Home / Self Care | Payer: No Typology Code available for payment source | Source: Ambulatory Visit | Attending: Obstetrics and Gynecology | Admitting: Obstetrics and Gynecology

## 2019-07-13 ENCOUNTER — Other Ambulatory Visit: Payer: Self-pay

## 2019-07-13 VITALS — BP 130/70 | Temp 97.1°F | Wt 179.0 lb

## 2019-07-13 DIAGNOSIS — Z1239 Encounter for other screening for malignant neoplasm of breast: Secondary | ICD-10-CM

## 2019-07-13 DIAGNOSIS — Z1231 Encounter for screening mammogram for malignant neoplasm of breast: Secondary | ICD-10-CM

## 2019-07-13 NOTE — Progress Notes (Signed)
[name] is a 44 y.o. female who presents to [facility] clinic today with no complaints.    Pap Smear: Pap not smear completed today. Last Pap smear was [facility] clinic on 05/28/2016 and  was normal. Per patient has no history of an abnormal Pap smear. Last Pap smear result is available in Epic.   Physical exam: Breasts Breasts symmetrical. No skin abnormalities bilateral breasts. No nipple retraction bilateral breasts. No nipple discharge bilateral breasts. No lymphadenopathy. No lumps palpated bilateral breasts.       Pelvic/Bimanual Pap is not indicated today    Smoking History: Patient has never smoked.    Patient Navigation: Patient education provided. Access to services provided for patient through [facility] program.  Colorectal Cancer Screening: Per patient has never had colonoscopy completed No complaints today.    Breast and Cervical Cancer Risk Assessment: Patient does not have family history of breast cancer, known genetic mutations, or radiation treatment to the chest before age 5. Patient does not have history of cervical dysplasia, immunocompromised, or DES exposure in-utero.  Risk Assessment    Risk Scores      07/13/2019 12/18/2017   Last edited by: Rebecca Revel, LPN Rebecca Infante H, LPN   5-year risk: 0.6 % 0.5 %   Lifetime risk: 7.3 % 7.4 %          A: [facility] exam without pap smear Complaint of nothing  P: Referred patient to the [facility] for a screening mammogram. Appointment scheduled today at 0930.  Rebecca Joseph, [place] 07/13/2019 9:07 AM

## 2019-07-15 ENCOUNTER — Encounter: Payer: Self-pay | Admitting: Nurse Practitioner

## 2019-07-21 ENCOUNTER — Ambulatory Visit: Payer: Self-pay | Attending: Nurse Practitioner

## 2019-07-21 ENCOUNTER — Other Ambulatory Visit: Payer: Self-pay

## 2019-07-21 ENCOUNTER — Other Ambulatory Visit: Payer: Self-pay | Admitting: Nurse Practitioner

## 2019-07-21 DIAGNOSIS — D500 Iron deficiency anemia secondary to blood loss (chronic): Secondary | ICD-10-CM

## 2019-07-22 LAB — IRON,TIBC AND FERRITIN PANEL
Ferritin: 7 ng/mL — ABNORMAL LOW (ref 15–150)
Iron Saturation: 5 % — CL (ref 15–55)
Iron: 21 ug/dL — ABNORMAL LOW (ref 27–159)
Total Iron Binding Capacity: 395 ug/dL (ref 250–450)
UIBC: 374 ug/dL (ref 131–425)

## 2019-07-22 LAB — CBC
Hematocrit: 29 % — ABNORMAL LOW (ref 34.0–46.6)
Hemoglobin: 9.5 g/dL — ABNORMAL LOW (ref 11.1–15.9)
MCH: 24.8 pg — ABNORMAL LOW (ref 26.6–33.0)
MCHC: 32.8 g/dL (ref 31.5–35.7)
MCV: 76 fL — ABNORMAL LOW (ref 79–97)
Platelets: 411 10*3/uL (ref 150–450)
RBC: 3.83 x10E6/uL (ref 3.77–5.28)
RDW: 18.9 % — ABNORMAL HIGH (ref 11.7–15.4)
WBC: 6.4 10*3/uL (ref 3.4–10.8)

## 2019-07-28 ENCOUNTER — Encounter: Payer: Self-pay | Admitting: *Deleted

## 2019-07-30 MED FILL — ?ALLOPURINOL 100MG TABLET: 100 | 30 days supply | Qty: 60 | Fill #8

## 2019-08-04 ENCOUNTER — Encounter: Payer: Self-pay | Admitting: Nurse Practitioner

## 2019-08-05 ENCOUNTER — Other Ambulatory Visit: Payer: Self-pay | Admitting: Nurse Practitioner

## 2019-08-05 MED ORDER — VALACYCLOVIR HCL 1 G PO TABS: 2000.0000 mg | ORAL_TABLET | Freq: Two times a day (BID) | ORAL | 1 refills | Status: AC

## 2019-08-05 MED FILL — valACYclovir HCL 1 GM TABS: 1 | 1 days supply | Qty: 4 | Fill #0

## 2019-08-11 MED FILL — METOPROLOL SUCCINATE ER 25: 25 | 30 days supply | Qty: 30 | Fill #2

## 2019-08-11 MED FILL — AMLODIPINE BESYLATE 5 MG TA: 5 | 30 days supply | Qty: 30 | Fill #0

## 2019-08-17 ENCOUNTER — Other Ambulatory Visit: Payer: Self-pay

## 2019-08-17 ENCOUNTER — Ambulatory Visit: Payer: Self-pay | Attending: Nurse Practitioner | Admitting: Nurse Practitioner

## 2019-08-19 ENCOUNTER — Other Ambulatory Visit: Payer: Self-pay

## 2019-08-19 ENCOUNTER — Encounter: Payer: Self-pay | Admitting: Nurse Practitioner

## 2019-08-19 ENCOUNTER — Encounter: Payer: Self-pay | Admitting: Family Medicine

## 2019-08-19 ENCOUNTER — Ambulatory Visit (INDEPENDENT_AMBULATORY_CARE_PROVIDER_SITE_OTHER): Payer: Self-pay | Admitting: Family Medicine

## 2019-08-19 VITALS — BP 131/82 | HR 95 | Ht 65.0 in | Wt 181.0 lb

## 2019-08-19 DIAGNOSIS — N920 Excessive and frequent menstruation with regular cycle: Secondary | ICD-10-CM

## 2019-08-19 NOTE — Patient Instructions (Signed)
Menorrhagia Menorrhagia is when your menstrual periods are heavy or last longer than normal. Follow these instructions at home: Medicines   Take over-the-counter and prescription medicines exactly as told by your doctor. This includes iron pills.  Do not change or switch medicines without asking your doctor.  Do not take aspirin or medicines that contain aspirin 1 week before or during your period. Aspirin may make bleeding worse. General instructions  If you need to change your pad or tampon more than once every 2 hours, limit your activity until the bleeding stops.  Iron pills can cause problems when pooping (constipation). To prevent or treat pooping problems while taking prescription iron pills, your doctor may suggest that you: ? Drink enough fluid to keep your pee (urine) clear or pale yellow. ? Take over-the-counter or prescription medicines. ? Eat foods that are high in fiber. These foods include:  Fresh fruits and vegetables.  Whole grains.  Beans. ? Limit foods that are high in fat and processed sugars. This includes fried and sweet foods.  Eat healthy meals and foods that are high in iron. Foods that have a lot of iron include: ? Leafy green vegetables. ? Meat. ? Liver. ? Eggs. ? Whole grain breads and cereals.  Do not try to lose weight until your heavy bleeding has stopped and you have normal amounts of iron in your blood. If you need to lose weight, work with your doctor.  Keep all follow-up visits as told by your doctor. This is important. Contact a doctor if:  You soak through a pad or tampon every 1 or 2 hours, and this happens every time you have a period.  You need to use pads and tampons at the same time because you are bleeding so much.  You are taking medicine and you: ? Feel sick to your stomach (nauseous). ? Throw up (vomit). ? Have watery poop (diarrhea).  You have other problems that may be related to the medicine you are taking. Get help  right away if:  You soak through more than a pad or tampon in 1 hour.  You pass clots bigger than 1 inch (2.5 cm) wide.  You feel short of breath.  You feel like your heart is beating too fast.  You feel dizzy or you pass out (faint).  You feel very weak or tired. Summary  Menorrhagia is when your menstrual periods are heavy or last longer than normal.  Take over-the-counter and prescription medicines exactly as told by your doctor. This includes iron pills.  Contact a doctor if you soak through more than a pad or tampon in 1 hour or are passing large clots. This information is not intended to replace advice given to you by your health care provider. Make sure you discuss any questions you have with your health care provider. Document Revised: 07/02/2017 Document Reviewed: 04/15/2016 Elsevier Patient Education  2020 Elsevier Inc.  

## 2019-08-20 ENCOUNTER — Other Ambulatory Visit: Payer: No Typology Code available for payment source

## 2019-08-20 NOTE — Assessment & Plan Note (Signed)
s/p IUD placement with IUD strings not seen--will check pelvic sono to verify. If not there, flat plate will be needed. If where it should be, will continue to monitor to see if bleeding improves.

## 2019-08-20 NOTE — Progress Notes (Signed)
   Subjective:    Patient ID: Rebecca Joseph is a 44 y.o. female presenting with Follow-up  on 08/19/2019  HPI: Patient with h/o 2 prior C-sections, adenomyosis and failed endometrial ablation due to perforation, had IUD placed on 3/1. She returns for check. Cannot feel strings. Cycle was super heavy in March and lighter in April.  Review of Systems  Constitutional: Negative for chills and fever.  Respiratory: Negative for shortness of breath.   Cardiovascular: Negative for chest pain.  Gastrointestinal: Negative for abdominal pain, nausea and vomiting.  Genitourinary: Negative for dysuria.  Skin: Negative for rash.      Objective:    BP 131/82   Pulse 95   Ht 5\' 5"  (1.651 m)   Wt 181 lb (82.1 kg)   LMP 08/08/2019 (Within Days)   BMI 30.12 kg/m  Physical Exam Constitutional:      General: She is not in acute distress.    Appearance: She is well-developed.  HENT:     Head: Normocephalic and atraumatic.  Eyes:     General: No scleral icterus. Cardiovascular:     Rate and Rhythm: Normal rate.  Pulmonary:     Effort: Pulmonary effort is normal.  Abdominal:     Palpations: Abdomen is soft.  Genitourinary:    Comments: BUS normal, vagina is pink and rugated, cervix is nulliparous without lesion,IUD strings are not visualized.  Musculoskeletal:     Cervical back: Neck supple.  Skin:    General: Skin is warm and dry.  Neurological:     Mental Status: She is alert and oriented to person, place, and time.         Assessment & Plan:   Problem List Items Addressed This Visit      Unprioritized   Menorrhagia - Primary    s/p IUD placement with IUD strings not seen--will check pelvic sono to verify. If not there, flat plate will be needed. If where it should be, will continue to monitor to see if bleeding improves.      Relevant Orders   US PELVIC COMPLETE WITH TRANSVAGINAL      Total time: 20 minutes.  Return in about 4 weeks (around 09/16/2019) for a  follow-up, needs U/S.  [name] 08/20/2019 5:19 PM

## 2019-08-23 MED FILL — GEMFIBROZIL 600 MG TAB: 600 | 30 days supply | Qty: 60 | Fill #3

## 2019-08-23 MED FILL — ?ALLOPURINOL 100MG TABLET: 100 | 30 days supply | Qty: 60 | Fill #2

## 2019-08-27 ENCOUNTER — Ambulatory Visit: Payer: No Typology Code available for payment source

## 2019-09-01 ENCOUNTER — Ambulatory Visit
Admission: RE | Admit: 2019-09-01 | Discharge: 2019-09-01 | Disposition: A | Discharge status: Home / Self Care | Payer: No Typology Code available for payment source | Source: Ambulatory Visit | Attending: Family Medicine | Admitting: Family Medicine

## 2019-09-01 ENCOUNTER — Other Ambulatory Visit: Payer: Self-pay

## 2019-09-01 DIAGNOSIS — N920 Excessive and frequent menstruation with regular cycle: Secondary | ICD-10-CM | POA: Insufficient documentation

## 2019-09-02 NOTE — Addendum Note (Signed)
Addended by: [name] on: 09/02/2019 08:07 AM   Modules accepted: Orders

## 2019-09-07 ENCOUNTER — Telehealth: Payer: Self-pay | Admitting: Lactation Services

## 2019-09-07 NOTE — Telephone Encounter (Signed)
Called Radiology to schedule pelvic x-ray. Spoke with Manuela Schwartz and was informed she did not need an appointment.   Patient was called to inform her that the order has been placed for an abdominal xray and she can go to [facility] or [place] at her convenience and go to admitting to sign in for Radiology.   Patient did not answer. LM for patient to call the office for results and information on getting xray done.   Sent My Chart message also.

## 2019-09-07 NOTE — Telephone Encounter (Signed)
-----   Message from [name], MD sent at 09/02/2019  8:07 AM EDT ----- Needs x-ray to check for IUD--orders in--please call pt. With instructions of where to go, etc.

## 2019-09-10 ENCOUNTER — Ambulatory Visit (HOSPITAL_COMMUNITY)
Admission: RE | Admit: 2019-09-10 | Discharge: 2019-09-10 | Disposition: A | Discharge status: Home / Self Care | Payer: No Typology Code available for payment source | Source: Ambulatory Visit | Attending: Family Medicine | Admitting: Family Medicine

## 2019-09-10 ENCOUNTER — Other Ambulatory Visit: Payer: Self-pay

## 2019-09-10 DIAGNOSIS — N920 Excessive and frequent menstruation with regular cycle: Secondary | ICD-10-CM

## 2019-09-14 ENCOUNTER — Other Ambulatory Visit: Payer: Self-pay | Admitting: Nurse Practitioner

## 2019-09-14 DIAGNOSIS — R000 Tachycardia, unspecified: Secondary | ICD-10-CM

## 2019-09-17 MED FILL — ?METOPROLOL SUCC ER 25MG TA: 25 | 30 days supply | Qty: 30 | Fill #0

## 2019-09-21 ENCOUNTER — Other Ambulatory Visit: Payer: Self-pay

## 2019-09-21 ENCOUNTER — Encounter: Payer: Self-pay | Admitting: Family Medicine

## 2019-09-21 ENCOUNTER — Ambulatory Visit (INDEPENDENT_AMBULATORY_CARE_PROVIDER_SITE_OTHER): Payer: Self-pay | Admitting: Family Medicine

## 2019-09-21 VITALS — BP 119/78 | HR 87 | Wt 180.0 lb

## 2019-09-21 DIAGNOSIS — N920 Excessive and frequent menstruation with regular cycle: Secondary | ICD-10-CM

## 2019-09-21 DIAGNOSIS — Z30430 Encounter for insertion of intrauterine contraceptive device: Secondary | ICD-10-CM

## 2019-09-21 MED ORDER — LEVONORGESTREL 19.5 MCG/DAY IU IUD
INTRAUTERINE_SYSTEM | Freq: Once | INTRAUTERINE | Status: AC
  Administered 2019-09-21: 52 mg via INTRAUTERINE

## 2019-09-21 NOTE — Patient Instructions (Addendum)
Levonorgestrel intrauterine device (IUD) What is this medicine? LEVONORGESTREL IUD (LEE voe nor jes trel) is a contraceptive (birth control) device. The device is placed inside the uterus by a healthcare professional. It is used to prevent pregnancy. This device can also be used to treat heavy bleeding that occurs during your period. This medicine may be used for other purposes; ask your health care provider or pharmacist if you have questions. COMMON BRAND NAME(S): Kyleena, LILETTA, Mirena, Skyla What should I tell my health care provider before I take this medicine? They need to know if you have any of these conditions:  abnormal Pap smear  cancer of the breast, uterus, or cervix  diabetes  endometritis  genital or pelvic infection now or in the past  have more than one sexual partner or your partner has more than one partner  heart disease  history of an ectopic or tubal pregnancy  immune system problems  IUD in place  liver disease or tumor  problems with blood clots or take blood-thinners  seizures  use intravenous drugs  uterus of unusual shape  vaginal bleeding that has not been explained  an unusual or allergic reaction to levonorgestrel, other hormones, silicone, or polyethylene, medicines, foods, dyes, or preservatives  pregnant or trying to get pregnant  breast-feeding How should I use this medicine? This device is placed inside the uterus by a health care professional. Talk to your pediatrician regarding the use of this medicine in children. Special care may be needed. Overdosage: If you think you have taken too much of this medicine contact a poison control center or emergency room at once. NOTE: This medicine is only for you. Do not share this medicine with others. What if I miss a dose? This does not apply. Depending on the brand of device you have inserted, the device will need to be replaced every 3 to 6 years if you wish to continue using this type  of birth control. What may interact with this medicine? Do not take this medicine with any of the following medications:  amprenavir  bosentan  fosamprenavir This medicine may also interact with the following medications:  aprepitant  armodafinil  barbiturate medicines for inducing sleep or treating seizures  bexarotene  boceprevir  griseofulvin  medicines to treat seizures like carbamazepine, ethotoin, felbamate, oxcarbazepine, phenytoin, topiramate  modafinil  pioglitazone  rifabutin  rifampin  rifapentine  some medicines to treat HIV infection like atazanavir, efavirenz, indinavir, lopinavir, nelfinavir, tipranavir, ritonavir  St. John's wort  warfarin This list may not describe all possible interactions. Give your health care provider a list of all the medicines, herbs, non-prescription drugs, or dietary supplements you use. Also tell them if you smoke, drink alcohol, or use illegal drugs. Some items may interact with your medicine. What should I watch for while using this medicine? Visit your doctor or health care professional for regular check ups. See your doctor if you or your partner has sexual contact with others, becomes HIV positive, or gets a sexual transmitted disease. This product does not protect you against HIV infection (AIDS) or other sexually transmitted diseases. You can check the placement of the IUD yourself by reaching up to the top of your vagina with clean fingers to feel the threads. Do not pull on the threads. It is a good habit to check placement after each menstrual period. Call your doctor right away if you feel more of the IUD than just the threads or if you cannot feel the threads at   all. The IUD may come out by itself. You may become pregnant if the device comes out. If you notice that the IUD has come out use a backup birth control method like condoms and call your health care provider. Using tampons will not change the position of the  IUD and are okay to use during your period. This IUD can be safely scanned with magnetic resonance imaging (MRI) only under specific conditions. Before you have an MRI, tell your healthcare provider that you have an IUD in place, and which type of IUD you have in place. What side effects may I notice from receiving this medicine? Side effects that you should report to your doctor or health care professional as soon as possible:  allergic reactions like skin rash, itching or hives, swelling of the face, lips, or tongue  fever, flu-like symptoms  genital sores  high blood pressure  no menstrual period for 6 weeks during use  pain, swelling, warmth in the leg  pelvic pain or tenderness  severe or sudden headache  signs of pregnancy  stomach cramping  sudden shortness of breath  trouble with balance, talking, or walking  unusual vaginal bleeding, discharge  yellowing of the eyes or skin Side effects that usually do not require medical attention (report to your doctor or health care professional if they continue or are bothersome):  acne  breast pain  change in sex drive or performance  changes in weight  cramping, dizziness, or faintness while the device is being inserted  headache  irregular menstrual bleeding within first 3 to 6 months of use  nausea This list may not describe all possible side effects. Call your doctor for medical advice about side effects. You may report side effects to FDA at 1-800-FDA-1088. Where should I keep my medicine? This does not apply. NOTE: This sheet is a summary. It may not cover all possible information. If you have questions about this medicine, talk to your doctor, pharmacist, or health care provider.  2020 Elsevier/Gold Standard (2018-02-03 13:22:01)  

## 2019-09-21 NOTE — Progress Notes (Signed)
GYNECOLOGY OFFICE NOTE  History:  44 y.o. [id number] here today for follow up on imaging. She had an IUD placed on 06/07/19, however the strings were not able to be seen on her follow up.  Briefly: she was referred for heavy cycles. She has anemia and has had iron infusion in the past. She had a previous attempt at endometrial ablation, but that failed due to uterine perforation. She was counseled on risks of hysterectomy versus Liletta IUD placement, and opted for Liletta.  Unfortunately, at her follow up appointment after the [facility] placement, strings could not be seen. Pelvic US was ordered, which did not see her IUD. Due to her history of uterine perforation, KUB was ordered- no IUD was seen there either.  Past Medical History:  Diagnosis Date  . Anemia   . Gout   . Hyperlipidemia   . Hypertension     Past Surgical History:  Procedure Laterality Date  . CESAREAN SECTION    . TUBAL LIGATION       Current Outpatient Medications:  .  allopurinol (ZYLOPRIM) 100 MG tablet, Take 2 tablets (200 mg total) by mouth daily., Disp: 180 tablet, Rfl: 2 .  amLODipine (NORVASC) 5 MG tablet, Take 1 tablet (5 mg total) by mouth daily., Disp: 90 tablet, Rfl: 3 .  colchicine 0.6 MG tablet, Take 1 tablet (0.6 mg total) by mouth daily as needed. (Patient not taking: Reported on 06/07/2019), Disp: 15 tablet, Rfl: 0 .  ferrous sulfate 325 (65 FE) MG tablet, Take 1 tablet (325 mg total) by mouth 2 (two) times daily with a meal. (Patient taking differently: Take 325 mg by mouth daily with breakfast. ), Disp: 60 tablet, Rfl: 3 .  gemfibrozil (LOPID) 600 MG tablet, Take 1 tablet (600 mg total) by mouth 2 (two) times daily before a meal., Disp: 180 tablet, Rfl: 3 .  metoprolol succinate (TOPROL-XL) 25 MG 24 hr tablet, TAKE 1 TABLET (25 MG TOTAL) BY MOUTH DAILY., Disp: 30 tablet, Rfl: 0 .  Vitamin D, Ergocalciferol, (DRISDOL) 1.25 MG (50000 UNIT) CAPS capsule, Take 1 capsule (50,000 Units total) by mouth every 7  (seven) days. (Patient not taking: Reported on 08/19/2019), Disp: 12 capsule, Rfl: 1  The following portions of the patient's history were reviewed and updated as appropriate: allergies, current medications, past family history, past medical history, past social history, past surgical history and problem list.   Review of Systems:  Pertinent items noted in HPI and remainder of comprehensive ROS otherwise negative.   Objective:  Physical Exam BP 119/78   Pulse 87   Wt 180 lb (81.6 kg)   BMI 29.95 kg/m  CONSTITUTIONAL: Well-developed, well-nourished female in no acute distress.  HENT:  Normocephalic, atraumatic. External right and left ear normal. Oropharynx is clear and moist EYES: Conjunctivae and EOM are normal. Pupils are equal, round, and reactive to light. No scleral icterus.  NECK: Normal range of motion, supple, no masses SKIN: Skin is warm and dry. No rash noted. Not diaphoretic. No erythema. No pallor. NEUROLOGIC: Alert and oriented to person, place, and time. Normal reflexes, muscle tone coordination. No cranial nerve deficit noted. PSYCHIATRIC: Normal mood and affect. Normal behavior. Normal judgment and thought content. CARDIOVASCULAR: Normal heart rate noted RESPIRATORY: Effort and breath sounds normal, no problems with respiration noted ABDOMEN: Soft, no distention noted.   PELVIC: Normal appearing external genitalia; normal appearing vaginal mucosa and cervix.  No abnormal discharge noted.  Normal uterine size, no other palpable masses, no uterine or  adnexal tenderness. Liletta IUD placed per procedure note. MUSCULOSKELETAL: Normal range of motion. No edema noted.  Exam done with chaperone present.  Labs and Imaging DG Abd 2 Views  Result Date: 09/10/2019 CLINICAL DATA:  Min Raja.  Lost IUD. EXAM: ABDOMEN - 2 VIEW COMPARISON:  None. FINDINGS: Bowel gas pattern is normal. No evidence IUD within the uterus or within the peritoneal space. Some sort a vaginal low opacity  structure such as a pessary in place. IMPRESSION: No visible IUD. Low opacity structure over the lower midline pelvis, perhaps some sort of pessary. Electronically Signed   By: Nelson Chimes M.D.   On: 09/10/2019 13:09   US PELVIC COMPLETE WITH TRANSVAGINAL  Result Date: 09/01/2019 CLINICAL DATA:  Assess for IUD placement; menorrhagia with regular cycle; prior Caesarean section and tubal ligation; LMP 08/08/2019 EXAM: TRANSABDOMINAL AND TRANSVAGINAL ULTRASOUND OF PELVIS TECHNIQUE: Both transabdominal and transvaginal ultrasound examinations of the pelvis were performed. Transabdominal technique was performed for global imaging of the pelvis including uterus, ovaries, adnexal regions, and pelvic cul-de-sac. It was necessary to proceed with endovaginal exam following the transabdominal exam to visualize the endometrium, IUD, and RIGHT ovary. COMPARISON:  03/24/2017 FINDINGS: Uterus Measurements: 11.3 x 7.6 x 8.1 cm = volume: 367 mL. Retroverted and retroflexed. Mildly heterogeneous myometrium. Anterior wall leiomyoma upper uterus 5.7 x 5.4 x 5.7 cm, transmural. Endometrium Thickness: 13 mm. Mildly displaced by adjacent leiomyoma. No endometrial fluid. No IUD identified. Right ovary Measurements: 3.5 x 2.8 x 2.6 cm = volume: 13.7 mL. Small corpus luteum without additional mass Left ovary Measurements: 2.5 x 1.2 x 1.5 cm = volume: 2.4 mL. Normal morphology without mass Other findings Small amount of free pelvic fluid.  No adnexal masses. IMPRESSION: Large transmural leiomyoma at anterior upper uterus 5.7 cm in size, extends submucosal and deforms the endometrial complex. Remainder of exam unremarkable. Specifically, no IUD identified. If there is clinical concern for trans uterine migration of IUD, recommend pelvic radiograph. Electronically Signed   By: Lavonia Dana M.D.   On: 09/01/2019 10:05    Assessment & Plan:  1. Menorrhagia with regular cycle - Discussed with patient her options at this point, she opts to  try for IUD again - IUD replaced at this appointment, see procedure note for details  Routine preventative health maintenance measures emphasized. Please refer to After Visit Summary for other counseling recommendations.   Return in about 4 weeks (around 10/19/2019) for IUD string check.  Total face-to-face time with patient: 23 minutes. Over 50% of encounter was spent on counseling and coordination of care.  Merilyn Baba, DO OB Fellow, [facility] 09/21/2019 4:02 PM

## 2019-09-21 NOTE — Progress Notes (Signed)
    IUD INSERTION PROCEDURE NOTE  Rebecca Joseph is a 44 y.o. [phone] here for Liletta insertion. GYN concern is menorrhagia.   She was counseled regarding the risks/benefits of IUD including insertion risk of infection, hemorrhage, damage to surrounding tissue and organs, uterine perforation. She was counseled regarding risks of IUD including implantation into uterine wall, malpositioning, misplacement out of the uterus, migration outside of uterus, possible need for hysteroscopic or laparoscopic removal, ovarian cysts, expulsion. She has BTL for birth control. She was advised that risk of pregnancy is low but is not zero and IUD insertion may cause miscarriage. Reviewed that she is also at slightly higher risk for ectopic pregnancy and she should take a pregnancy test if she believes she may be pregnant. She verbalized understanding of all of the above and consent signed.   IUD Insertion  Patient identified and an adequate time out was performed. Speculum placed in the vagina. The cervix was cleaned with Betadine x 2 and grasped anteriorly with a single tooth tenaculum.  A uterine sound was used to sound the uterus to 6 cm;  the IUD was then placed per manufacturer's recommendations. Strings trimmed to 3 cm. Tenaculum was removed, good hemostasis noted. Patient tolerated procedure well.   Patient was given post-procedure instructions.  She was reminded to have backup contraception for one week during this transition period between IUDs.  Patient was also asked to check IUD strings periodically and follow up in 4 weeks for IUD check.  Liletta IUD Exp: 03/2023 Lot: [id number]  Merilyn Baba, DO OB Fellow, [facility] 09/21/2019 3:50 PM

## 2019-09-21 NOTE — Progress Notes (Signed)
Has tubal since 04 Had IUD for Vaginal Bleeding  Wants to talk about an ablation. Had Depo in the past and it didn't help.

## 2019-09-21 NOTE — Addendum Note (Signed)
Addended by: Alric Seton on: 09/21/2019 04:09 PM   Modules accepted: Orders

## 2019-09-28 MED FILL — ?ALLOPURINOL 100MG TABLET: 100 | 30 days supply | Qty: 60 | Fill #0

## 2019-09-28 MED FILL — GEMFIBROZIL 600 MG TAB: 600 | 30 days supply | Qty: 60 | Fill #4

## 2019-09-29 ENCOUNTER — Ambulatory Visit: Payer: No Typology Code available for payment source | Admitting: Nurse Practitioner

## 2019-10-13 ENCOUNTER — Other Ambulatory Visit: Payer: Self-pay | Admitting: Family Medicine

## 2019-10-13 DIAGNOSIS — R000 Tachycardia, unspecified: Secondary | ICD-10-CM

## 2019-10-13 MED FILL — ?AMLODIPINE BESYLATE 5MG TA: 5 | 30 days supply | Qty: 30 | Fill #2

## 2019-10-14 MED FILL — ?METOPROLOL SUCC ER 25MG TA: 25 | 30 days supply | Qty: 30 | Fill #0

## 2019-10-25 ENCOUNTER — Ambulatory Visit: Payer: No Typology Code available for payment source | Admitting: Obstetrics and Gynecology

## 2019-11-01 ENCOUNTER — Other Ambulatory Visit: Payer: Self-pay

## 2019-11-01 ENCOUNTER — Encounter: Payer: Self-pay | Admitting: Obstetrics and Gynecology

## 2019-11-01 ENCOUNTER — Ambulatory Visit (INDEPENDENT_AMBULATORY_CARE_PROVIDER_SITE_OTHER): Payer: Self-pay | Admitting: Obstetrics and Gynecology

## 2019-11-01 VITALS — BP 182/87 | HR 106 | Ht 65.0 in | Wt 182.1 lb

## 2019-11-01 DIAGNOSIS — Z30431 Encounter for routine checking of intrauterine contraceptive device: Secondary | ICD-10-CM

## 2019-11-01 DIAGNOSIS — D250 Submucous leiomyoma of uterus: Secondary | ICD-10-CM | POA: Insufficient documentation

## 2019-11-01 DIAGNOSIS — N920 Excessive and frequent menstruation with regular cycle: Secondary | ICD-10-CM

## 2019-11-01 NOTE — Patient Instructions (Signed)
Uterine Artery Embolization for Fibroids  Uterine artery embolization is a procedure to shrink uterine fibroids. Uterine fibroids are masses of tissue (tumors) that can develop in the womb (uterus). They are also called leiomyomas. This type of tumor is not cancerous (benign) and does not spread to other parts of the body outside of the pelvic area. The pelvic area is the part of the body between the hip bones. You can have one or many fibroids. Fibroids can vary in size, shape, weight, and where they grow in the uterus. Some can become quite large. In this procedure, a thin plastic tube (catheter) is used to inject a chemical that blocks off the blood supply to the fibroid, which causes the fibroid to shrink. Tell a health care provider about:  Any allergies you have.  All medicines you are taking, including vitamins, herbs, eye drops, creams, and over-the-counter medicines.  Any problems you or family members have had with anesthetic medicines.  Any blood disorders you have.  Any surgeries you have had.  Any medical conditions you have.  Whether you are pregnant or may be pregnant. What are the risks? Generally, this is a safe procedure. However, problems may occur, including:  Bleeding.  Allergic reactions to medicines or dyes.  Damage to other structures or organs.  Infection, including blood infection (septicemia).  Injury to the uterus from decreased blood supply.  Lack of menstrual periods (amenorrhea).  Death of tissue cells (necrosis) around your bladder or vulva.  Development of a hole between organs or from an organ to the surface of your skin (fistula).  Blood clot in the legs (deep vein thrombosis) or lung (pulmonary embolus).  Nausea and vomiting. What happens before the procedure? Staying hydrated Follow instructions from your health care provider about hydration, which may include:  Up to 2 hours before the procedure - you may continue to drink clear  liquids, such as water, clear fruit juice, black coffee, and plain tea. Eating and drinking restrictions Follow instructions from your health care provider about eating and drinking, which may include:  8 hours before the procedure - stop eating heavy meals or foods such as meat, fried foods, or fatty foods.  6 hours before the procedure - stop eating light meals or foods, such as toast or cereal.  6 hours before the procedure - stop drinking milk or drinks that contain milk.  2 hours before the procedure - stop drinking clear liquids. Medicines  Ask your health care provider about: ? Changing or stopping your regular medicines. This is especially important if you are taking diabetes medicines or blood thinners. ? Taking over-the-counter medicines, vitamins, herbs, and supplements. ? Taking medicines such as aspirin and ibuprofen. These medicines can thin your blood. Do not take these medicines unless your health care provider tells you to take them.  You may be given antibiotic medicine to help prevent infection.  You may be given medicine to prevent nausea and vomiting (antiemetic). General instructions  Ask your health care provider how your surgical site will be marked or identified.  You may be asked to shower with a germ-killing soap.  Plan to have someone take you home from the hospital or clinic.  If you will be going home right after the procedure, plan to have someone with you for 24 hours.  You will be asked to empty your bladder. What happens during the procedure?  To lower your risk of infection: ? Your health care team will wash or sanitize their hands. ?   Hair may be removed from the surgical area. ? Your skin will be washed with soap.  An IV will be inserted into one of your veins.  You will be given one or more of the following: ? A medicine to help you relax (sedative). ? A medicine to numb the area (local anesthetic).  A small cut (incision) will be made  in your groin.  A catheter will be inserted into the main artery of your leg. The catheter will be guided through the artery to your uterus.  A series of images will be taken while dye is injected through the catheter in your groin. X-rays are taken at the same time. This is done to provide a road map of the blood supply to your uterus and fibroids.  Tiny plastic spheres, about the size of sand grains, will be injected through the catheter. Metal coils may be used to help block the artery. The particles will lodge in tiny branches of the uterine artery that supplies blood to the fibroids.  The procedure will be repeated on the artery that supplies the other side of the uterus.  The catheter will be removed and pressure will be applied to stop the bleeding.  A dressing will be placed over the incision. The procedure may vary among health care providers and hospitals. What happens after the procedure?  Your blood pressure, heart rate, breathing rate, and blood oxygen level will be monitored until the medicines you were given have worn off.  You will be given pain medicine as needed.  You may be given medicine for nausea and vomiting as needed.  Do not drive for 24 hours if you were given a sedative. Summary  Uterine artery embolization is a procedure to shrink uterine fibroids by blocking the blood supply to the fibroid.  You may be given a sedative and local anesthetic for the procedure.  A catheter will be inserted into the main artery of your leg. The catheter will be guided through the artery to your uterus.  After the procedure you will be given pain medicine and medicine for nausea as needed.  Do not drive for 24 hours if you were given a sedative. This information is not intended to replace advice given to you by your health care provider. Make sure you discuss any questions you have with your health care provider. Document Revised: 03/07/2017 Document Reviewed:  06/27/2016 Elsevier Patient Education  2020 [employer]  

## 2019-11-01 NOTE — Progress Notes (Signed)
   Subjective:   Patient Name: Rebecca Joseph, female   DOB: 1975/06/03, 44 y.o.  MRN: [id number]  HPI Patient here for an IUD check.  She had the [place] IUD placed 1 month ago.  She reports heavy menses shortly after insertion.  Patient states that previous IUD was lost.  Ultrasound and X-ray ruled out intra-peritoneal placement.  The ultrasound did show a submucosal fibroid measuring around 5cm.   Review of Systems  Constitutional: Negative for fever and chills.  Gastrointestinal: Negative for abdominal pain.  Genitourinary: Negative for vaginal discharge, vaginal pain, pelvic pain and dyspareunia.        Objective:   Physical Exam  Constitutional: She appears well-developed and well-nourished.  HENT:  Head: Normocephalic and atraumatic.  Abdominal: Soft. There is no tenderness. There is no guarding.  Genitourinary: There is no rash, tenderness or lesion on the right labia. There is no rash, tenderness or lesion on the left labia. No erythema or tenderness in the vagina. No foreign body around the vagina. No signs of injury around the vagina. No vaginal discharge found.    Skin: Skin is warm and dry.  Psychiatric: She has a normal mood and affect. Her behavior is normal. Judgment and thought content normal.       Assessment & Plan:  Menorrhagia with regular cycle  Submucous leiomyoma of uterus  IUD check up  IUD in place.  Pt to call with any other problems. Patient to return in 2 months to re-evaluate the heavy menses.  She may be a good candidate of uterine artery embolization or hysteroscopic resection of the submucosal fibroid (Myosure) and an endometrial ablation (Novasure).  Dwana Curd. [place], MD

## 2019-11-05 ENCOUNTER — Other Ambulatory Visit: Payer: Self-pay | Admitting: Nurse Practitioner

## 2019-11-05 DIAGNOSIS — R000 Tachycardia, unspecified: Secondary | ICD-10-CM

## 2019-11-05 MED FILL — ?ALLOPURINOL 100MG TABLET: 100 | 30 days supply | Qty: 60 | Fill #1

## 2019-11-05 MED FILL — METOPROLOL SUCCINATE ER 25: 25 | 30 days supply | Qty: 30 | Fill #0

## 2019-11-05 MED FILL — GEMFIBROZIL 600 MG TAB: 600 | 30 days supply | Qty: 60 | Fill #5

## 2019-11-05 MED FILL — ?AMLODIPINE BESYLATE 5MG TA: 5 | 30 days supply | Qty: 30 | Fill #3

## 2019-11-05 NOTE — Telephone Encounter (Signed)
Requested medication (s) are due for refill today:no  Requested medication (s) are on the active medication list: yes  Last refill:  10/14/2019  Future visit scheduled: yes  Notes to clinic: Patient has appointment on 11/16/2019 Patient will run out on 11/14/2019   Requested Prescriptions  Pending Prescriptions Disp Refills   metoprolol succinate (TOPROL-XL) 25 MG 24 hr tablet [Pharmacy Med Name: METOPROLOL SUCC ER 25MG  TA 25 Tablet] 30 tablet 0    Sig: TAKE 1 TABLET (25 MG TOTAL) BY MOUTH DAILY.      Cardiovascular:  Beta Blockers Failed - 11/05/2019  9:57 AM      Failed - Last BP in normal range    BP Readings from Last 1 Encounters:  11/01/19 (!) 182/87          Failed - Valid encounter within last 6 months    Recent Outpatient Visits           6 months ago Adjustment insomnia   [place], [place] W, NP   1 year ago Gout of right foot, unspecified cause, unspecified chronicity   [place], [place]   1 year ago Acute gout of right foot, unspecified cause   [place], [place], MD   1 year ago Essential hypertension   [place], Zelda W, NP   1 year ago Essential hypertension   [place], RPH-CPP       Future Appointments             In 1 week [name], NP [place]            Passed - Last Heart Rate in normal range    Pulse Readings from Last 1 Encounters:  11/01/19 (!) 106

## 2019-11-16 ENCOUNTER — Ambulatory Visit: Payer: No Typology Code available for payment source | Admitting: Nurse Practitioner

## 2019-12-02 ENCOUNTER — Encounter: Payer: Self-pay | Admitting: Nurse Practitioner

## 2019-12-02 ENCOUNTER — Other Ambulatory Visit: Payer: Self-pay | Admitting: Nurse Practitioner

## 2019-12-02 MED ORDER — NORETHINDRONE ACETATE 5 MG PO TABS: 10.0000 mg | ORAL_TABLET | Freq: Every day | ORAL | 0 refills | Status: DC

## 2019-12-03 MED FILL — NORETHINDRONE 5 MG TABLET: 5 | 10 days supply | Qty: 20 | Fill #0

## 2019-12-08 ENCOUNTER — Encounter: Payer: Self-pay | Admitting: Nurse Practitioner

## 2019-12-09 MED ORDER — NORETHINDRONE ACETATE 5 MG PO TABS: 10.0000 mg | ORAL_TABLET | Freq: Every day | ORAL | 3 refills | Status: DC

## 2019-12-09 MED FILL — NORETHINDRONE 5 MG TABLET: 5 | 22 days supply | Qty: 45 | Fill #0

## 2019-12-10 ENCOUNTER — Other Ambulatory Visit: Payer: Self-pay | Admitting: Family Medicine

## 2019-12-10 DIAGNOSIS — R000 Tachycardia, unspecified: Secondary | ICD-10-CM

## 2019-12-10 MED FILL — METOPROLOL SUCCINATE ER 25: 25 | 30 days supply | Qty: 30 | Fill #0

## 2019-12-10 MED FILL — GEMFIBROZIL 600 MG TAB: 600 | 30 days supply | Qty: 60 | Fill #6

## 2019-12-10 MED FILL — ALLOPURINOL 100 MG TABLET: 100 | 30 days supply | Qty: 60 | Fill #2

## 2019-12-10 MED FILL — AMLODIPINE BESYLATE 5 MG TA: 5 | 30 days supply | Qty: 30 | Fill #4

## 2019-12-27 ENCOUNTER — Ambulatory Visit: Payer: 59 | Attending: Nurse Practitioner | Admitting: Nurse Practitioner

## 2019-12-27 ENCOUNTER — Other Ambulatory Visit: Payer: Self-pay | Admitting: Nurse Practitioner

## 2019-12-27 ENCOUNTER — Encounter: Payer: Self-pay | Admitting: Nurse Practitioner

## 2019-12-27 ENCOUNTER — Other Ambulatory Visit: Payer: Self-pay

## 2019-12-27 ENCOUNTER — Other Ambulatory Visit (HOSPITAL_COMMUNITY)
Admission: RE | Admit: 2019-12-27 | Discharge: 2019-12-27 | Disposition: A | Discharge status: Home / Self Care | Payer: 59 | Source: Ambulatory Visit | Attending: Nurse Practitioner | Admitting: Nurse Practitioner

## 2019-12-27 VITALS — BP 144/90 | HR 100 | Temp 97.7°F | Ht 65.0 in | Wt 184.0 lb

## 2019-12-27 DIAGNOSIS — D649 Anemia, unspecified: Secondary | ICD-10-CM | POA: Diagnosis not present

## 2019-12-27 DIAGNOSIS — E783 Hyperchylomicronemia: Secondary | ICD-10-CM

## 2019-12-27 DIAGNOSIS — Z23 Encounter for immunization: Secondary | ICD-10-CM

## 2019-12-27 DIAGNOSIS — Z124 Encounter for screening for malignant neoplasm of cervix: Secondary | ICD-10-CM

## 2019-12-27 DIAGNOSIS — N889 Noninflammatory disorder of cervix uteri, unspecified: Secondary | ICD-10-CM

## 2019-12-27 DIAGNOSIS — Z1159 Encounter for screening for other viral diseases: Secondary | ICD-10-CM

## 2019-12-27 DIAGNOSIS — I10 Essential (primary) hypertension: Secondary | ICD-10-CM

## 2019-12-27 DIAGNOSIS — M109 Gout, unspecified: Secondary | ICD-10-CM

## 2019-12-27 DIAGNOSIS — R000 Tachycardia, unspecified: Secondary | ICD-10-CM

## 2019-12-27 DIAGNOSIS — Z114 Encounter for screening for human immunodeficiency virus [HIV]: Secondary | ICD-10-CM

## 2019-12-27 MED ORDER — GEMFIBROZIL 600 MG PO TABS: 600.0000 mg | ORAL_TABLET | Freq: Two times a day (BID) | ORAL | 3 refills | Status: DC

## 2019-12-27 MED ORDER — AMLODIPINE BESYLATE 10 MG PO TABS: 10.0000 mg | ORAL_TABLET | Freq: Every day | ORAL | 0 refills | Status: DC

## 2019-12-27 MED ORDER — METOPROLOL SUCCINATE ER 25 MG PO TB24: 25.0000 mg | ORAL_TABLET | Freq: Every day | ORAL | 0 refills | Status: DC

## 2019-12-27 MED ORDER — ALLOPURINOL 100 MG PO TABS: 200.0000 mg | ORAL_TABLET | Freq: Every day | ORAL | 2 refills | Status: DC

## 2019-12-27 MED ORDER — AMLODIPINE BESYLATE 5 MG PO TABS: 5.0000 mg | ORAL_TABLET | Freq: Every day | ORAL | 3 refills | Status: DC

## 2019-12-27 MED ORDER — COLCHICINE 0.6 MG PO TABS: 0.6000 mg | ORAL_TABLET | Freq: Every day | ORAL | 0 refills | Status: DC | PRN

## 2019-12-27 NOTE — Patient Instructions (Signed)
BLOOD BUILDER by [employer]  for anemia

## 2019-12-27 NOTE — Progress Notes (Signed)
Assessment & Plan:  Rebecca Joseph was seen today for gynecologic exam.  Diagnoses and all orders for this visit:  Encounter for Papanicolaou smear for cervical cancer screening -     Cytology - PAP -     Cervicovaginal ancillary only  Need for hepatitis C screening test -     Hepatitis C Antibody  Need for immunization against influenza -     Flu Vaccine QUAD 6+ mos PF IM (Fluarix Quad PF)  Anemia, unspecified type -     CBC -     Iron, TIBC and Ferritin Panel Recommend blood builder to help with anemia  Encounter for screening for HIV -     HIV antibody (with reflex)  Cervical lesion -     Ambulatory referral to Gynecology  Gout of right foot, unspecified cause, unspecified chronicity -     allopurinol (ZYLOPRIM) 100 MG tablet; Take 2 tablets (200 mg total) by mouth daily. -     colchicine 0.6 MG tablet; Take 1 tablet (0.6 mg total) by mouth daily as needed.  Essential hypertension -     amLODipine (NORVASC) 10 MG tablet; Take 1 tablet (10 mg total) by mouth daily.  Continue all antihypertensives as prescribed.  Remember to bring in your blood pressure log with you for your follow up appointment.  DASH/Mediterranean Diets are healthier choices for HTN.  BP Readings from Last 3 Encounters:  12/27/19 (!) 144/90  11/01/19 (!) 182/87  09/21/19 119/78    Mixed hyperglyceridemia -     gemfibrozil (LOPID) 600 MG tablet; Take 1 tablet (600 mg total) by mouth 2 (two) times daily before a meal. INSTRUCTIONS: Work on a low fat, heart healthy diet and participate in regular aerobic exercise program by working out at least 150 minutes per week; 5 days a week-30 minutes per day. Avoid red meat/beef/steak,  fried foods. junk foods, sodas, sugary drinks, unhealthy snacking, alcohol and smoking.  Drink at least 80 oz of water per day and monitor your carbohydrate intake daily.    Tachycardia -     metoprolol succinate (TOPROL-XL) 25 MG 24 hr tablet; Take 1 tablet (25 mg total) by mouth  daily.    Patient has been counseled on age-appropriate routine health concerns for screening and prevention. These are reviewed and up-to-date. Referrals have been placed accordingly. Immunizations are up-to-date or declined.    Subjective:   Chief Complaint  Patient presents with  . Gynecologic Exam    pt. is here for pap smear.    HPI Rebecca Joseph 44 y.o. female presents to office today for pap smear. Notes intermittent chills. Endorses not consistently taking her iron tablets daily due to schedule with job.  Review of Systems  Constitutional: Positive for chills. Negative for fever, malaise/fatigue and weight loss.  Respiratory: Negative.  Negative for cough, shortness of breath and wheezing.   Cardiovascular: Negative.  Negative for chest pain, orthopnea and leg swelling.  Gastrointestinal: Negative for abdominal pain.  Genitourinary: Negative.  Negative for flank pain.  Skin: Negative.  Negative for rash.  Psychiatric/Behavioral: Negative for suicidal ideas.    Past Medical History:  Diagnosis Date  . Anemia   . Gout   . Hyperlipidemia   . Hypertension     Past Surgical History:  Procedure Laterality Date  . CESAREAN SECTION    . TUBAL LIGATION      Family History  Problem Relation Age of Onset  . Diabetes Father   . Hypertension Father   .  Heart disease Father        CHF  . Gout Father   . Hypertension Mother   . Breast cancer Maternal Aunt   . Cancer Maternal Aunt   . Breast cancer Cousin   . Cancer Cousin        Maternal Cousin died from breast cancer    Social History Reviewed with no changes to be made today.   Outpatient Medications Prior to Visit  Medication Sig Dispense Refill  . ferrous sulfate 325 (65 FE) MG tablet Take 1 tablet (325 mg total) by mouth 2 (two) times daily with a meal. (Patient taking differently: Take 325 mg by mouth daily with breakfast. ) 60 tablet 3  . norethindrone (AYGESTIN) 5 MG tablet Take 2 tablets (10 mg  total) by mouth daily. Take during your cycle only. 45 tablet 3  . amLODipine (NORVASC) 5 MG tablet Take 1 tablet (5 mg total) by mouth daily. 90 tablet 3  . metoprolol succinate (TOPROL-XL) 25 MG 24 hr tablet TAKE 1 TABLET (25 MG TOTAL) BY MOUTH DAILY. 30 tablet 0  . Vitamin D, Ergocalciferol, (DRISDOL) 1.25 MG (50000 UNIT) CAPS capsule Take 1 capsule (50,000 Units total) by mouth every 7 (seven) days. (Patient not taking: Reported on 08/19/2019) 12 capsule 1  . allopurinol (ZYLOPRIM) 100 MG tablet Take 2 tablets (200 mg total) by mouth daily. 180 tablet 2  . colchicine 0.6 MG tablet Take 1 tablet (0.6 mg total) by mouth daily as needed. (Patient not taking: Reported on 06/07/2019) 15 tablet 0  . gemfibrozil (LOPID) 600 MG tablet Take 1 tablet (600 mg total) by mouth 2 (two) times daily before a meal. 180 tablet 3   No facility-administered medications prior to visit.    Allergies  Allergen Reactions  . Naproxen Shortness Of Breath       Objective:    BP (!) 144/90 (BP Location: Left Arm, Patient Position: Sitting, Cuff Size: Normal)   Pulse 100   Temp 97.7 F (36.5 C) (Temporal)   Ht 5\' 5"  (1.651 m)   Wt 184 lb (83.5 kg)   SpO2 100%   BMI 30.62 kg/m  Wt Readings from Last 3 Encounters:  12/27/19 184 lb (83.5 kg)  11/01/19 182 lb 1.6 oz (82.6 kg)  09/21/19 180 lb (81.6 kg)    Physical Exam Exam conducted with a chaperone present.  Constitutional:      Appearance: She is well-developed.  HENT:     Head: Normocephalic.  Cardiovascular:     Rate and Rhythm: Normal rate and regular rhythm.     Heart sounds: Normal heart sounds.  Pulmonary:     Effort: Pulmonary effort is normal.     Breath sounds: Normal breath sounds.  Abdominal:     General: Bowel sounds are normal.     Palpations: Abdomen is soft.     Hernia: There is no hernia in the left inguinal area.  Genitourinary:    Exam position: Lithotomy position.     Labia:        Right: No rash, tenderness, lesion or  injury.        Left: No rash, tenderness, lesion or injury.      Vagina: Normal. No signs of injury and foreign body. No vaginal discharge, erythema, tenderness or bleeding.     Cervix: Lesion (see photo) present. No cervical motion tenderness or friability.     Uterus: Not deviated and not enlarged.      Adnexa:  Right: No mass, tenderness or fullness.         Left: No mass, tenderness or fullness.       Rectum: Normal. No external hemorrhoid.     Lymphadenopathy:     Lower Body: No right inguinal adenopathy. No left inguinal adenopathy.  Skin:    General: Skin is warm and dry.  Neurological:     Mental Status: She is alert and oriented to person, place, and time.  Psychiatric:        Behavior: Behavior normal.        Thought Content: Thought content normal.        Judgment: Judgment normal.           Patient has been counseled extensively about nutrition and exercise as well as the importance of adherence with medications and regular follow-up. The patient was given clear instructions to go to ER or return to medical center if symptoms don't improve, worsen or new problems develop. The patient verbalized understanding.   Follow-up: Return in about 3 months (around 03/27/2020).   [name], FNP-BC [facility] and [facility] [place], [place]   12/27/2019, 4:42 PM

## 2019-12-28 ENCOUNTER — Other Ambulatory Visit: Payer: Self-pay | Admitting: Pharmacist

## 2019-12-28 LAB — CBC
Hematocrit: 25.5 % — ABNORMAL LOW (ref 34.0–46.6)
Hemoglobin: 7.2 g/dL — ABNORMAL LOW (ref 11.1–15.9)
MCH: 18.6 pg — ABNORMAL LOW (ref 26.6–33.0)
MCHC: 28.2 g/dL — ABNORMAL LOW (ref 31.5–35.7)
MCV: 66 fL — ABNORMAL LOW (ref 79–97)
Platelets: 399 10*3/uL (ref 150–450)
RBC: 3.88 x10E6/uL (ref 3.77–5.28)
RDW: 20.3 % — ABNORMAL HIGH (ref 11.7–15.4)
WBC: 9.3 10*3/uL (ref 3.4–10.8)

## 2019-12-28 LAB — IRON,TIBC AND FERRITIN PANEL
Ferritin: 2 ng/mL — ABNORMAL LOW (ref 15–150)
Iron Saturation: 3 % — CL (ref 15–55)
Iron: 14 ug/dL — ABNORMAL LOW (ref 27–159)
Total Iron Binding Capacity: 537 ug/dL — ABNORMAL HIGH (ref 250–450)
UIBC: 523 ug/dL — ABNORMAL HIGH (ref 131–425)

## 2019-12-28 LAB — HIV ANTIBODY (ROUTINE TESTING W REFLEX): HIV Screen 4th Generation wRfx: NONREACTIVE

## 2019-12-28 LAB — HEPATITIS C ANTIBODY: Hep C Virus Ab: <0.1 s/co ratio (ref 0.0–0.9)

## 2019-12-28 MED ORDER — MITIGARE 0.6 MG PO CAPS: ORAL_CAPSULE | ORAL | 2 refills | Status: DC

## 2019-12-28 MED FILL — MITIGARE 0.6 MG CAPSULE: 0.6 | 30 days supply | Qty: 30 | Fill #0

## 2019-12-28 MED FILL — AMLODIPINE BESYLATE 10 MG T: 10 | 30 days supply | Qty: 30 | Fill #0

## 2019-12-29 ENCOUNTER — Encounter: Payer: Self-pay | Admitting: Nurse Practitioner

## 2019-12-29 LAB — CERVICOVAGINAL ANCILLARY ONLY
Bacterial Vaginitis (gardnerella): NEGATIVE
Candida Glabrata: NEGATIVE
Candida Vaginitis: NEGATIVE
Chlamydia: NEGATIVE
Comment: NEGATIVE
Comment: NEGATIVE
Comment: NEGATIVE
Comment: NEGATIVE
Comment: NEGATIVE
Comment: NORMAL
Neisseria Gonorrhea: NEGATIVE
Trichomonas: NEGATIVE

## 2019-12-30 LAB — CYTOLOGY - PAP
Comment: NEGATIVE
Diagnosis: NEGATIVE
High risk HPV: NEGATIVE

## 2019-12-31 ENCOUNTER — Ambulatory Visit: Payer: No Typology Code available for payment source | Admitting: Family Medicine

## 2020-01-10 MED FILL — NORETHINDRONE 5 MG TABLET: 5 | 22 days supply | Qty: 45 | Fill #1

## 2020-01-10 MED FILL — ALLOPURINOL 100 MG TABLET: 100 | 30 days supply | Qty: 60 | Fill #3

## 2020-01-10 MED FILL — METOPROLOL SUCCINATE ER 25: 25 | 90 days supply | Qty: 90 | Fill #0

## 2020-01-13 ENCOUNTER — Encounter: Payer: Self-pay | Admitting: Nurse Practitioner

## 2020-01-14 ENCOUNTER — Other Ambulatory Visit: Payer: Self-pay

## 2020-01-14 DIAGNOSIS — R000 Tachycardia, unspecified: Secondary | ICD-10-CM

## 2020-01-14 MED ORDER — METOPROLOL SUCCINATE ER 25 MG PO TB24: 25.0000 mg | ORAL_TABLET | Freq: Every day | ORAL | 0 refills | Status: DC

## 2020-01-14 NOTE — Progress Notes (Signed)
Patient came in and requested if PCP can send her another Rx of Metoprolol Succinate, she lost her medication. Rx sent.

## 2020-01-20 ENCOUNTER — Encounter: Payer: Self-pay | Admitting: Nurse Practitioner

## 2020-01-21 ENCOUNTER — Other Ambulatory Visit: Payer: Self-pay | Admitting: Nurse Practitioner

## 2020-01-21 MED ORDER — LOSARTAN POTASSIUM 50 MG PO TABS
50.0000 mg | ORAL_TABLET | Freq: Every day | ORAL | 1 refills | Status: DC
Start: 2020-01-21 — End: 2020-01-24

## 2020-01-21 MED ORDER — CHLORTHALIDONE 25 MG PO TABS
25.0000 mg | ORAL_TABLET | Freq: Every day | ORAL | 1 refills | Status: DC
Start: 2020-01-21 — End: 2020-01-21

## 2020-01-24 ENCOUNTER — Ambulatory Visit: Payer: No Typology Code available for payment source | Admitting: Internal Medicine

## 2020-01-24 ENCOUNTER — Other Ambulatory Visit: Payer: Self-pay | Admitting: Nurse Practitioner

## 2020-01-24 MED ORDER — LOSARTAN POTASSIUM 50 MG PO TABS: 50.0000 mg | ORAL_TABLET | Freq: Every day | ORAL | 1 refills | Status: DC

## 2020-01-24 MED FILL — LOSARTAN POTASSIUM 50 MG TA: 50 | 30 days supply | Qty: 30 | Fill #0

## 2020-01-31 MED FILL — GEMFIBROZIL 600 MG TAB: 600 | 30 days supply | Qty: 60 | Fill #7

## 2020-02-02 ENCOUNTER — Encounter: Payer: Self-pay | Admitting: Nurse Practitioner

## 2020-02-03 MED FILL — ALLOPURINOL 100 MG TABLET: 100 | 30 days supply | Qty: 60 | Fill #4

## 2020-02-08 NOTE — Progress Notes (Signed)
.     S:    PCP: [name], NP PMH: gout, obesity, tobacco abuse  Patient arrives in good spirits. Presents to the clinic for hypertension evaluation, counseling, and management. Patient was referred and last seen by Primary Care Provider on 12/27/19. Recently on 01/20/20, patient was instructed to stop amlodipine due to LE swelling. She was started on Losartan 50 mg daily.  Today, patient reports medication adherence and no side effects with losartan. Reports resolution of LE swelling since discontinuing amlodipine. Reports not checking BP consistently at home. BP today at an OBGYN visit was 143/96. Denies headaches, dizziness, blurred vision, and LE sweliing.    Medication adherence reported excellent.  Current BP Medications include: losartan 50 mg daily (AM), Toprol XL 25 mg daily (AM)  Antihypertensives tried in the past include: amlodipine 10 mg daily (LE swelling)  Family / Social history:  -Fhx: Father with diabetes, HTN, heart disease, gout; Mother with HTN -Tobacco use: former smoker  O:  Vitals:   02/09/20 1545  BP: (!) 146/96  Pulse: 85    Home BP readings: none  Last 3 Office BP readings: BP Readings from Last 3 Encounters:  02/09/20 (!) 146/96  02/09/20 (!) 143/96  12/27/19 (!) 144/90    BMET    Component Value Date/Time   NA 140 04/27/2019 1101   K 4.2 04/27/2019 1101   CL 103 04/27/2019 1101   CO2 19 (L) 04/27/2019 1101   GLUCOSE 94 04/27/2019 1101   GLUCOSE 114 (H) 09/08/2015 0902   BUN 13 04/27/2019 1101   CREATININE 0.71 04/27/2019 1101   CALCIUM 9.8 04/27/2019 1101   GFRNONAA 105 04/27/2019 1101   GFRAA 121 04/27/2019 1101    Renal function: CrCl cannot be calculated (Patient's most recent lab result is older than the maximum 21 days allowed.).  Clinical ASCVD: No  The ASCVD Risk score Mikey Bussing DC Jr., et al., 2013) failed to calculate for the following reasons:   The valid total cholesterol range is 130 to 320 mg/dL   A/P: Hypertension  longstanding currently uncontrolled on current medications. BP Goal = <130/80 mmHg. Medication adherence appears optimal. Will increase losartan from 50 mg to 100 mg daily. Discussed theimportance of checkingBP daily at home at least 1 hour after taking medications and bring log to next visit. Patient verbalized understanding. Unable to discuss diet and exercise due to time constraint, will plan to address at next visit. Will check BMET today to assess kidney function and electrolytes after losartan initiation. -Increased losartan to 100 mg daily -Continued Toprol XL 25 mg daily  -F/u labs ordered - Check BMET today -Counseled on lifestyle modifications for blood pressure control including reduced dietary sodium, increased exercise, adequate sleep.  Results reviewed and written information provided. Total time in face-to-face counseling 30 minutes.   F/U Clinic Visit in 3 weeks.   Lorel Monaco, PharmD PGY2 Ambulatory Care Resident [place]

## 2020-02-09 ENCOUNTER — Encounter: Payer: Self-pay | Admitting: Pharmacist

## 2020-02-09 ENCOUNTER — Encounter: Payer: Self-pay | Admitting: Family Medicine

## 2020-02-09 ENCOUNTER — Ambulatory Visit: Payer: 59 | Attending: Nurse Practitioner | Admitting: Pharmacist

## 2020-02-09 ENCOUNTER — Other Ambulatory Visit: Payer: Self-pay

## 2020-02-09 ENCOUNTER — Ambulatory Visit (INDEPENDENT_AMBULATORY_CARE_PROVIDER_SITE_OTHER): Payer: 59 | Admitting: Family Medicine

## 2020-02-09 VITALS — BP 143/96 | HR 96 | Wt 185.3 lb

## 2020-02-09 VITALS — BP 146/96 | HR 85

## 2020-02-09 DIAGNOSIS — Z8249 Family history of ischemic heart disease and other diseases of the circulatory system: Secondary | ICD-10-CM | POA: Insufficient documentation

## 2020-02-09 DIAGNOSIS — M109 Gout, unspecified: Secondary | ICD-10-CM | POA: Insufficient documentation

## 2020-02-09 DIAGNOSIS — I10 Essential (primary) hypertension: Secondary | ICD-10-CM | POA: Insufficient documentation

## 2020-02-09 DIAGNOSIS — E669 Obesity, unspecified: Secondary | ICD-10-CM | POA: Insufficient documentation

## 2020-02-09 DIAGNOSIS — Z720 Tobacco use: Secondary | ICD-10-CM | POA: Insufficient documentation

## 2020-02-09 DIAGNOSIS — N889 Noninflammatory disorder of cervix uteri, unspecified: Secondary | ICD-10-CM

## 2020-02-09 DIAGNOSIS — N920 Excessive and frequent menstruation with regular cycle: Secondary | ICD-10-CM

## 2020-02-09 DIAGNOSIS — Z79899 Other long term (current) drug therapy: Secondary | ICD-10-CM | POA: Insufficient documentation

## 2020-02-09 MED ORDER — LOSARTAN POTASSIUM 100 MG PO TABS: 100.0000 mg | ORAL_TABLET | Freq: Every day | ORAL | 2 refills | Status: DC

## 2020-02-09 MED FILL — LOSARTAN POTASSIUM 100 MG T: 100 | 30 days supply | Qty: 30 | Fill #0

## 2020-02-09 NOTE — Assessment & Plan Note (Signed)
Continue Aygestin, return for further eval. Offered other surgical treatment options, she declines these for now.

## 2020-02-09 NOTE — Progress Notes (Signed)
   Subjective:    Patient ID: Rebecca Joseph is a 44 y.o. female presenting with Menorrhagia  on 02/09/2020  HPI: S/p IUD x 2 for abnormal bleeding. Both have fallen out. She got a photo of the last one. On Aygestin which is controlling her bleeding. She is happy with this. Also her PCP did a pap and noted a lesion on the cervix.   Review of Systems  Constitutional: Negative for chills and fever.  Respiratory: Negative for shortness of breath.   Cardiovascular: Negative for chest pain.  Gastrointestinal: Negative for abdominal pain, nausea and vomiting.  Genitourinary: Negative for dysuria.  Skin: Negative for rash.      Objective:    BP (!) 143/96   Pulse 96   Wt 185 lb 4.8 oz (84.1 kg)   LMP 02/03/2020 (Exact Date)   BMI 30.84 kg/m  Physical Exam Constitutional:      General: She is not in acute distress.    Appearance: She is well-developed.  HENT:     Head: Normocephalic and atraumatic.  Eyes:     General: No scleral icterus. Cardiovascular:     Rate and Rhythm: Normal rate.  Pulmonary:     Effort: Pulmonary effort is normal.  Abdominal:     Palpations: Abdomen is soft.  Genitourinary:    Comments: Darkened area, c/w blood on cervix at 1 o'clock, somewhat larger than previous.  Musculoskeletal:     Cervical back: Neck supple.  Skin:    General: Skin is warm and dry.  Neurological:     Mental Status: She is alert and oriented to person, place, and time.    Procedure: Cervix visualized and lesionnoted.  Mass biopsied x 1. Chocolate colored fluid removed. Minimal bleeding.      Assessment & Plan:   Problem List Items Addressed This Visit      Unprioritized   Menorrhagia    Continue Aygestin, return for further eval. Offered other surgical treatment options, she declines these for now.       Other Visit Diagnoses    Cervical lesion    -  Primary   likely endometriosis. To pathology   Relevant Orders   Surgical pathology( [place]/  [facility])      Total time in review of prior notes, pathology, labs, history taking, review with patient, exam, note writing, discussion of options, plan for next steps, alternatives and risks of treatment: 35 minutes.  Return in about 6 months (around 08/08/2020).  [name] 02/09/2020 4:40 PM

## 2020-02-10 LAB — BASIC METABOLIC PANEL
BUN/Creatinine Ratio: 17 (ref 9–23)
BUN: 11 mg/dL (ref 6–24)
CO2: 20 mmol/L (ref 20–29)
Calcium: 9.7 mg/dL (ref 8.7–10.2)
Chloride: 106 mmol/L (ref 96–106)
Creatinine, Ser: 0.65 mg/dL (ref 0.57–1.00)
GFR calc Af Amer: 125 mL/min/{1.73_m2} (ref >59)
GFR calc non Af Amer: 108 mL/min/{1.73_m2} (ref >59)
Glucose: 81 mg/dL (ref 65–99)
Potassium: 4.3 mmol/L (ref 3.5–5.2)
Sodium: 140 mmol/L (ref 134–144)

## 2020-02-11 ENCOUNTER — Other Ambulatory Visit: Payer: Self-pay | Admitting: Nurse Practitioner

## 2020-02-11 DIAGNOSIS — D500 Iron deficiency anemia secondary to blood loss (chronic): Secondary | ICD-10-CM

## 2020-02-11 LAB — SURGICAL PATHOLOGY

## 2020-02-25 MED FILL — NORETHINDRONE 5 MG TABLET: 5 | 22 days supply | Qty: 45 | Fill #2

## 2020-02-25 MED FILL — GEMFIBROZIL 600 MG TAB: 600 | 30 days supply | Qty: 60 | Fill #8

## 2020-03-09 MED FILL — LOSARTAN POTASSIUM 100 MG T: 100 | 30 days supply | Qty: 30 | Fill #1

## 2020-03-20 MED FILL — ALLOPURINOL 100 MG TABLET: 100 | 30 days supply | Qty: 60 | Fill #5

## 2020-03-20 MED FILL — NORETHINDRONE 5 MG TABLET: 5 | 22 days supply | Qty: 45 | Fill #3

## 2020-04-06 MED FILL — LOSARTAN POTASSIUM 100 MG T: 100 | 30 days supply | Qty: 30 | Fill #2

## 2020-04-06 MED FILL — METOPROLOL SUCCINATE ER 25: 25 | 90 days supply | Qty: 90 | Fill #0

## 2020-04-08 DIAGNOSIS — E559 Vitamin D deficiency, unspecified: Secondary | ICD-10-CM

## 2020-04-08 HISTORY — DX: Vitamin D deficiency, unspecified: E55.9

## 2020-04-11 ENCOUNTER — Other Ambulatory Visit: Payer: Self-pay | Admitting: Family Medicine

## 2020-04-11 MED ORDER — NORETHINDRONE ACETATE 5 MG PO TABS: 10.0000 mg | ORAL_TABLET | Freq: Every day | ORAL | 3 refills | Status: DC

## 2020-04-12 ENCOUNTER — Other Ambulatory Visit: Payer: Self-pay | Admitting: Nurse Practitioner

## 2020-04-12 ENCOUNTER — Encounter: Payer: Self-pay | Admitting: Nurse Practitioner

## 2020-04-12 DIAGNOSIS — I10 Essential (primary) hypertension: Secondary | ICD-10-CM

## 2020-04-12 DIAGNOSIS — M109 Gout, unspecified: Secondary | ICD-10-CM

## 2020-04-12 DIAGNOSIS — R000 Tachycardia, unspecified: Secondary | ICD-10-CM

## 2020-04-12 DIAGNOSIS — E783 Hyperchylomicronemia: Secondary | ICD-10-CM

## 2020-04-12 MED ORDER — ALLOPURINOL 100 MG PO TABS: 200.0000 mg | ORAL_TABLET | Freq: Every day | ORAL | 2 refills | Status: DC

## 2020-04-12 MED ORDER — METOPROLOL SUCCINATE ER 25 MG PO TB24: 25.0000 mg | ORAL_TABLET | Freq: Every day | ORAL | 0 refills | Status: DC

## 2020-04-12 MED ORDER — GEMFIBROZIL 600 MG PO TABS: 600.0000 mg | ORAL_TABLET | Freq: Two times a day (BID) | ORAL | 3 refills | Status: DC

## 2020-04-12 MED ORDER — LOSARTAN POTASSIUM 100 MG PO TABS: 100.0000 mg | ORAL_TABLET | Freq: Every day | ORAL | 2 refills | Status: DC

## 2020-04-16 ENCOUNTER — Encounter: Payer: Self-pay | Admitting: Nurse Practitioner

## 2020-05-04 ENCOUNTER — Other Ambulatory Visit: Payer: Self-pay | Admitting: Family Medicine

## 2020-05-06 ENCOUNTER — Other Ambulatory Visit: Payer: Self-pay | Admitting: Nurse Practitioner

## 2020-05-06 DIAGNOSIS — I10 Essential (primary) hypertension: Secondary | ICD-10-CM

## 2020-05-07 ENCOUNTER — Encounter: Payer: Self-pay | Admitting: Nurse Practitioner

## 2020-05-08 ENCOUNTER — Other Ambulatory Visit: Payer: Self-pay | Admitting: Nurse Practitioner

## 2020-05-08 DIAGNOSIS — I10 Essential (primary) hypertension: Secondary | ICD-10-CM

## 2020-05-08 MED ORDER — LOSARTAN POTASSIUM 100 MG PO TABS: 100.0000 mg | ORAL_TABLET | Freq: Every day | ORAL | 0 refills | Status: DC

## 2020-05-11 ENCOUNTER — Ambulatory Visit (INDEPENDENT_AMBULATORY_CARE_PROVIDER_SITE_OTHER): Payer: 59 | Admitting: Family Medicine

## 2020-05-11 ENCOUNTER — Other Ambulatory Visit: Payer: Self-pay

## 2020-05-11 ENCOUNTER — Ambulatory Visit: Payer: No Typology Code available for payment source | Admitting: Family Medicine

## 2020-05-11 ENCOUNTER — Encounter: Payer: Self-pay | Admitting: Family Medicine

## 2020-05-11 VITALS — BP 140/93 | HR 96 | Ht 65.0 in | Wt 183.0 lb

## 2020-05-11 DIAGNOSIS — R32 Unspecified urinary incontinence: Secondary | ICD-10-CM | POA: Diagnosis not present

## 2020-05-11 DIAGNOSIS — N920 Excessive and frequent menstruation with regular cycle: Secondary | ICD-10-CM | POA: Diagnosis not present

## 2020-05-11 DIAGNOSIS — D250 Submucous leiomyoma of uterus: Secondary | ICD-10-CM | POA: Diagnosis not present

## 2020-05-11 NOTE — Progress Notes (Signed)
   Subjective:    Patient ID: Rebecca Joseph is a 45 y.o. female presenting with Follow-up  on 05/11/2020  HPI: Now up to Aygestin 3x/day. Notes some cramping. She would like to consider other options.   Review of Systems  Constitutional: Negative for chills and fever.  Respiratory: Negative for shortness of breath.   Cardiovascular: Negative for chest pain.  Gastrointestinal: Negative for abdominal pain, nausea and vomiting.  Genitourinary: Negative for dysuria.  Skin: Negative for rash.      Objective:    BP (!) 140/93   Pulse 96   Ht 5\' 5"  (1.651 m)   Wt 183 lb (83 kg)   LMP 05/02/2020 (Approximate)   BMI 30.45 kg/m  Physical Exam Constitutional:      General: She is not in acute distress.    Appearance: She is well-developed and well-nourished.  HENT:     Head: Normocephalic and atraumatic.  Eyes:     General: No scleral icterus. Cardiovascular:     Rate and Rhythm: Normal rate.  Pulmonary:     Effort: Pulmonary effort is normal.  Abdominal:     Palpations: Abdomen is soft.  Musculoskeletal:     Cervical back: Neck supple.  Skin:    General: Skin is warm and dry.  Neurological:     Mental Status: She is alert and oriented to person, place, and time.  Psychiatric:        Mood and Affect: Mood and affect normal.         Assessment & Plan:   Problem List Items Addressed This Visit      Unprioritized   Menorrhagia - Primary    Continue Aygestin      Submucous leiomyoma of uterus    Interested in UFE. Will continue Aygestin until then.      Relevant Orders   Ambulatory referral to Interventional Radiology   Incontinence in female    Does not sound like true urge or stress, will refer to [place] for formal testing.      Relevant Orders   Ambulatory referral to Urogynecology      Total time in review of prior notes, pathology, labs, history taking, review with patient, exam, note writing, discussion of options, plan for next steps,  alternatives and risks of treatment: 21 minutes.  Return in about 3 months (around 08/08/2020) for virtual, referral.  Rebecca Joseph 05/12/2020 7:14 AM

## 2020-05-12 ENCOUNTER — Encounter: Payer: Self-pay | Admitting: Family Medicine

## 2020-05-12 DIAGNOSIS — R32 Unspecified urinary incontinence: Secondary | ICD-10-CM | POA: Insufficient documentation

## 2020-05-12 NOTE — Assessment & Plan Note (Signed)
Does not sound like true urge or stress, will refer to [place] for formal testing.

## 2020-05-12 NOTE — Assessment & Plan Note (Signed)
Interested in UFE. Will continue Aygestin until then.

## 2020-05-12 NOTE — Assessment & Plan Note (Signed)
Continue Aygestin

## 2020-05-20 ENCOUNTER — Encounter: Payer: Self-pay | Admitting: Nurse Practitioner

## 2020-06-03 ENCOUNTER — Other Ambulatory Visit: Payer: Self-pay | Admitting: Nurse Practitioner

## 2020-06-03 DIAGNOSIS — R000 Tachycardia, unspecified: Secondary | ICD-10-CM

## 2020-06-09 ENCOUNTER — Ambulatory Visit: Payer: No Typology Code available for payment source | Admitting: Family Medicine

## 2020-06-15 ENCOUNTER — Telehealth: Payer: Self-pay | Admitting: Lactation Services

## 2020-06-15 ENCOUNTER — Other Ambulatory Visit: Payer: Self-pay | Admitting: Family Medicine

## 2020-06-15 DIAGNOSIS — D250 Submucous leiomyoma of uterus: Secondary | ICD-10-CM

## 2020-06-15 NOTE — Progress Notes (Signed)
Referral to IR

## 2020-06-15 NOTE — Telephone Encounter (Signed)
Called Interventional Radiology. LM on voicemail to call patient to schedule UFE. Left patient name and MRN. Asked IR to call the office with any questions or concerns at [phone].   Called patient to let her know that ID has been reached out to and will reach out to patient to schedule. Patient voiced understanding.

## 2020-06-15 NOTE — Telephone Encounter (Signed)
-----   Message from [name], MD sent at 06/15/2020  8:16 AM EST ----- Regarding: RE: referrals I placed an order--can we ensure this get scheduled--not sure how I missed it before--make my apologies, it should happen soon. ----- Message ----- From: Donn Pierini, RN Sent: 06/14/2020   9:35 AM EST To: [name], MD Subject: referrals                                      Dr. Kennon Rounds,   Patient is asking about what is the plan for the UFE, will someone call her to schedule?   Thanks Ivin Booty, RN

## 2020-06-16 ENCOUNTER — Encounter: Payer: Self-pay | Admitting: Nurse Practitioner

## 2020-06-18 ENCOUNTER — Other Ambulatory Visit: Payer: Self-pay | Admitting: Nurse Practitioner

## 2020-06-18 MED ORDER — TRIAMCINOLONE ACETONIDE 0.025 % EX OINT: 1.0000 "application " | TOPICAL_OINTMENT | Freq: Two times a day (BID) | CUTANEOUS | 0 refills | Status: DC

## 2020-06-21 NOTE — Progress Notes (Signed)
[facility] New Patient Evaluation and Consultation  Referring Provider: [name], MD PCP: [name], NP Date of Service: 06/23/2020  SUBJECTIVE Chief Complaint: New Patient (Initial Visit) (Dr Kennon Rounds referral for urinary incontinence)  History of Present Illness: Rebecca Joseph is a 45 y.o. Black or African-American female seen in consultation at the request of Dr. Kennon Rounds for evaluation of incontinence.    Review of records significant for: Has mixed incontinence symptoms, not true stress or urge.   Urinary Symptoms: Leaks urine with during sex and without sensation (no warning) Leaks 3-4 time(s) per day.  Pad use:  liners/ mini-pads She is bothered by her UI symptoms. Uses a menstrual cup- and when she uses it then she doesn't have any leakage. So uses it without having a period.   Day time voids 4.  Nocturia: 2 times per night to void. Voiding dysfunction: she empties her bladder well.  does not use a catheter to empty bladder.  When urinating, she feels dribbling after finishing Drinks: about 30 oz water, several cups of tea per day  UTIs: 0 UTI's in the last year.   Denies history of blood in urine and kidney or bladder stones  Pelvic Organ Prolapse Symptoms:                  She Admits to a feeling of a bulge the vaginal area. Not sure though- feels some pressure sometimes.   Bowel Symptom: Bowel movements: 2 time(s) per day Stool consistency: soft  Straining: no.  Splinting: no.  Incomplete evacuation: no.  She Denies accidental bowel leakage / fecal incontinence Bowel regimen: none Last colonoscopy: n/a  Sexual Function Sexually active: yes.  Sexual orientation: heterosexual Pain with sex: Yes, occasionally deep in the pelvis  Pelvic Pain Admits to pelvic pain Location: from fibroids Pain occurs: occasionally Prior pain treatment: ibuprofen  Past Medical History:  Past Medical History:  Diagnosis Date  . Anemia   .  Gout   . Hyperlipidemia   . Hypertension      Past Surgical History:   Past Surgical History:  Procedure Laterality Date  . CESAREAN SECTION    . TUBAL LIGATION       Past OB/GYN History: G4 P3 Vaginal deliveries: 1,  Forceps/ Vacuum deliveries: 0, Cesarean section: 2 Regular periods with menorrhagia. Has been referred for uterine artery embolization.  Last pap smear was 12/2019.  Any history of abnormal pap smears: no.   Medications: She has a current medication list which includes the following prescription(s): allopurinol, ferrous sulfate, gemfibrozil, losartan, metoprolol succinate, norethindrone, oxybutynin, and triamcinolone.   Allergies: Patient is allergic to naproxen.   Social History:  Social History   Tobacco Use  . Smoking status: Former Research scientist (life sciences)  . Smokeless tobacco: Never Used  Vaping Use  . Vaping Use: Never used  Substance Use Topics  . Alcohol use: Yes    Comment: occasionally  . Drug use: No    She is employed .  Family History:   Family History  Problem Relation Age of Onset  . Diabetes Father   . Hypertension Father   . Heart disease Father        CHF  . Gout Father   . Hypertension Mother   . Breast cancer Maternal Aunt   . Cancer Maternal Aunt   . Breast cancer Cousin   . Cancer Cousin        Maternal Cousin died from breast cancer  Review of Systems: Review of Systems  Constitutional: Negative for fever, malaise/fatigue and weight loss.  Respiratory: Negative for cough, shortness of breath and wheezing.   Cardiovascular: Negative for chest pain, palpitations and leg swelling.  Gastrointestinal: Negative for abdominal pain and blood in stool.  Genitourinary: Negative for dysuria.  Musculoskeletal: Negative for myalgias.  Skin: Negative for rash.  Neurological: Negative for dizziness and headaches.  Endo/Heme/Allergies: Does not bruise/bleed easily.  Psychiatric/Behavioral: Negative for depression. The patient is not  nervous/anxious.      OBJECTIVE Physical Exam: Vitals:   06/23/20 1537  BP: (!) 144/93  Pulse: 92  Weight: 185 lb (83.9 kg)  Height: 5\' 5"  (1.651 m)    Physical Exam Constitutional:      General: She is not in acute distress. Pulmonary:     Effort: Pulmonary effort is normal.  Abdominal:     General: There is no distension.     Palpations: Abdomen is soft.     Tenderness: There is no abdominal tenderness. There is no rebound.     Comments: Uterus palpated approximately 4 cm below the umbilicus  Musculoskeletal:        General: No swelling. Normal range of motion.  Skin:    General: Skin is warm and dry.     Findings: No rash.  Neurological:     Mental Status: She is alert and oriented to person, place, and time.  Psychiatric:        Mood and Affect: Mood normal.        Behavior: Behavior normal.      GU / Detailed Urogynecologic Evaluation:  Pelvic Exam: Normal external female genitalia; Bartholin's and Skene's glands normal in appearance; urethral meatus normal in appearance, no urethral masses or discharge.   CST: negative   Speculum exam reveals normal vaginal mucosa without atrophy. Cervix normal appearance. Uterus enlarged and approximately 16 weeks size. Adnexa no mass, fullness, tenderness.     Pelvic floor strength III/V  Pelvic floor musculature: Right levator non-tender, Right obturator non-tender, Left levator non-tender, Left obturator non-tender  POP-Q:   POP-Q  -2.5                                            Aa   -2.5                                           Ba  -4.5                                              C   3                                            Gh  4                                            Pb  7.5  tvl   -3                                            Ap  -3                                            Bp  -7                                              D     Rectal  Exam:  Normal external rectum  Post-Void Residual (PVR) by Insertion of catheter: In order to evaluate bladder emptying, we discussed obtaining a postvoid residual and she agreed to this procedure.  Procedure: The ultrasound unit was placed on the patient's abdomen in the suprapubic region after the patient had voided. Several different values were obtained ranging from 5- 215ml. Therefore patient consented to straight catheterization. The urethra was prepped with betadine. A 12 F straight catheter was inserted and drained approximately 41ml of clear yellow urine.   Laboratory Results: POC urine: neagtive I visualized the urine specimen, noting the specimen to be clear yellow  ASSESSMENT AND PLAN Ms. Coale is a 45 y.o. with:  1. Overactive bladder   2. Urinary frequency   3. SUI (stress urinary incontinence, female)    1. OAB We discussed the symptoms of overactive bladder (OAB), which include urinary urgency, urinary frequency, nocturia, with or without urge incontinence.  While we do not know the exact etiology of OAB, several treatment options exist. We discussed management including behavioral therapy (decreasing bladder irritants, urge suppression strategies, timed voids, bladder retraining), physical therapy, medication; for refractory cases posterior tibial nerve stimulation, sacral neuromodulation, and intravesical botulinum toxin injection.  For anticholinergic medications, we discussed the potential side effects of anticholinergics including dry eyes, dry mouth, constipation, cognitive impairment and urinary retention. - She will start oxybutynin 5mg  XL daily (only med covered by insurance). Rx sent to pharmacy. - She will also work on reducing tea throughout the day.   2. Urinary frequency - No sign of infection on POC urine today. Empties bladder well  3. SUI For treatment of stress urinary incontinence,  non-surgical options include expectant management, weight loss,  physical therapy, as well as a pessary.  We did not review surgical options today.  - She is not bothered as much by her SUI. We dicussed that the menstrual cup may be acting more like a pessary and protecting her from leakage. She prefers expectant management at this time.   Returns 6 weeks for follow up   Jaquita Folds, MD   Medical Decision Making:  - Reviewed/ ordered a clinical laboratory test - Review and summation of prior records

## 2020-06-22 ENCOUNTER — Other Ambulatory Visit: Payer: Self-pay | Admitting: Family Medicine

## 2020-06-22 DIAGNOSIS — D250 Submucous leiomyoma of uterus: Secondary | ICD-10-CM

## 2020-06-23 ENCOUNTER — Ambulatory Visit (INDEPENDENT_AMBULATORY_CARE_PROVIDER_SITE_OTHER): Payer: 59 | Admitting: Obstetrics and Gynecology

## 2020-06-23 ENCOUNTER — Other Ambulatory Visit: Payer: Self-pay

## 2020-06-23 ENCOUNTER — Encounter: Payer: Self-pay | Admitting: Nurse Practitioner

## 2020-06-23 ENCOUNTER — Encounter: Payer: Self-pay | Admitting: Obstetrics and Gynecology

## 2020-06-23 VITALS — BP 144/93 | HR 92 | Ht 65.0 in | Wt 185.0 lb

## 2020-06-23 DIAGNOSIS — R350 Frequency of micturition: Secondary | ICD-10-CM | POA: Diagnosis not present

## 2020-06-23 DIAGNOSIS — N393 Stress incontinence (female) (male): Secondary | ICD-10-CM

## 2020-06-23 DIAGNOSIS — N3281 Overactive bladder: Secondary | ICD-10-CM

## 2020-06-23 LAB — POCT URINALYSIS DIPSTICK
Appearance: NORMAL
Bilirubin, UA: NEGATIVE
Blood, UA: NEGATIVE
Glucose, UA: NEGATIVE
Ketones, UA: NEGATIVE
Leukocytes, UA: NEGATIVE
Nitrite, UA: NEGATIVE
Protein, UA: NEGATIVE
Spec Grav, UA: 1.01 (ref 1.010–1.025)
Urobilinogen, UA: 0.2 E.U./dL
pH, UA: 5 (ref 5.0–8.0)

## 2020-06-23 MED ORDER — OXYBUTYNIN CHLORIDE ER 5 MG PO TB24: 5.0000 mg | ORAL_TABLET | Freq: Every day | ORAL | 5 refills | Status: DC

## 2020-06-23 NOTE — Patient Instructions (Signed)
We discussed the symptoms of overactive bladder (OAB), which include urinary urgency, urinary frequency, night-time urination, with or without urge incontinence.  We discussed management including behavioral therapy (decreasing bladder irritants by following a bladder diet, urge suppression strategies, timed voids, bladder retraining), physical therapy, medication; and for refractory cases posterior tibial nerve stimulation, sacral neuromodulation, and intravesical botulinum toxin injection.   For anticholinergic medications, we discussed the potential side effects of anticholinergics including dry eyes, dry mouth, constipation, rare risks of cognitive impairment and urinary retention. You were given oxybutynin 5mg  daily.   It can take a month to start working so give it time, but if you have bothersome side effects call sooner and we can try a different medication.  Call us if you have trouble filling the prescription or if it's not covered by your insurance.  For treatment of stress urinary incontinence, which is leakage with physical activity/movement/strainging/coughing, we discussed expectant management versus nonsurgical options versus surgery. Nonsurgical options include weight loss, physical therapy, as well as a pessary.  Surgical options include a midurethral sling, which is a synthetic mesh sling that acts like a hammock under the urethra to prevent leakage of urine, a Burch urethropexy, and transurethral injection of a bulking agent.   Today we talked about ways to manage bladder urgency such as altering your diet to avoid irritative beverages and foods (bladder diet) as well as attempting to decrease stress and other exacerbating factors.   The Most Bothersome Foods* The Least Bothersome Foods*  Coffee - Regular & Decaf Tea - caffeinated Carbonated beverages - cola, non-colas, diet & caffeine-free Alcohols - Beer, Red Wine, White Wine, Champagne Fruits - Grapefruit, [place], Orange,  [employer] - Cranberry, Grapefruit, Orange, Pineapple Vegetables - Tomato & Tomato Products Flavor Enhancers - Hot peppers, Spicy foods, Chili, Horseradish, Vinegar, Monosodium glutamate (MSG) Artificial Sweeteners - NutraSweet, Sweet 'N Low, Equal (sweetener), Saccharin Ethnic foods - [place], [place], [place] food Express Scripts - low-fat & whole Fruits - Bananas, Blueberries, Honeydew melon, Pears, Raisins, Watermelon Vegetables - Broccoli, Brussels Sprouts, Rudy, Carrots, Cauliflower, [place], Cucumber, Mushrooms, Peas, Radishes, Squash, Zucchini, White potatoes, Sweet potatoes & yams Poultry - Chicken, Eggs, [place], [employer] - Beef, Programmer, multimedia, Lamb Seafood - Shrimp, [place] fish, Salmon Grains - Oat, Rice Snacks - Pretzels, Popcorn  *Lissa Morales et al. Diet and its role in interstitial cystitis/bladder pain syndrome (IC/BPS) and comorbid conditions. [place] 2012 Jan 11.

## 2020-07-12 ENCOUNTER — Ambulatory Visit
Admission: RE | Admit: 2020-07-12 | Discharge: 2020-07-12 | Disposition: A | Discharge status: Home / Self Care | Payer: 59 | Source: Ambulatory Visit | Attending: Family Medicine | Admitting: Family Medicine

## 2020-07-12 ENCOUNTER — Encounter: Payer: Self-pay | Admitting: *Deleted

## 2020-07-12 DIAGNOSIS — D250 Submucous leiomyoma of uterus: Secondary | ICD-10-CM

## 2020-07-12 HISTORY — PX: IR RADIOLOGIST EVAL & MGMT: IMG5224

## 2020-07-12 NOTE — Consult Note (Signed)
Chief Complaint: Patient was seen in consultation today for uterine artery embolization for symptomatic uterine fibroids, at the request of Joseph,Rebecca S  Referring Physician(s): Joseph,Rebecca S  History of Present Illness: Rebecca Joseph is a 45 y.o. female, who presents to interventional radiology clinic for discussion of uterine artery embolization for symptomatic uterine fibroids.   She has been experiencing daily bleeding and cramping for the past 2 years.  The symptoms have improved somewhat with hormonal therapy, however daily breakthrough bleeding continues.  She has had two prior IUD's placed but both have dislodged.  More recently she has begun to experience urinary urgency which may be attributed to bulk symptoms.   She has had prior tubal ligation.  She has 3 children age 77, 56, and 52.  She does not have a recent endometrial biopsy.  Her pelvic ultrasound demonstrated a dominant anterior uterine fibroid.    Past Medical History:  Diagnosis Date  . Anemia   . Gout   . Hyperlipidemia   . Hypertension     Past Surgical History:  Procedure Laterality Date  . CESAREAN SECTION    . IR RADIOLOGIST EVAL & MGMT  07/12/2020  . TUBAL LIGATION      Allergies: Naproxen  Medications: Prior to Admission medications   Medication Sig Start Date End Date Taking? Authorizing Provider  allopurinol (ZYLOPRIM) 100 MG tablet Take 2 tablets (200 mg total) by mouth daily. 04/12/20 07/11/20  [name], NP  Ferrous Sulfate (IRON PO) Take by mouth.    [provider]  gemfibrozil (LOPID) 600 MG tablet Take 1 tablet (600 mg total) by mouth 2 (two) times daily before a meal. 04/12/20 07/11/20  [name], NP  losartan (COZAAR) 100 MG tablet Take 1 tablet (100 mg total) by mouth daily. Please fill as a 90 day supply 05/08/20 08/06/20  [name], NP  metoprolol succinate (TOPROL-XL) 25 MG 24 hr tablet Take 1 tablet (25 mg total) by mouth daily. 04/12/20 07/11/20   [name], NP  norethindrone (AYGESTIN) 5 MG tablet Take 3 tablets (15 mg total) by mouth daily. 06/20/20   [name], MD  oxybutynin (DITROPAN-XL) 5 MG 24 hr tablet Take 1 tablet (5 mg total) by mouth daily. 06/23/20   Jaquita Folds, MD  triamcinolone (KENALOG) 0.025 % ointment Apply 1 application topically 2 (two) times daily. 06/18/20   [name], NP     Family History  Problem Relation Age of Onset  . Diabetes Father   . Hypertension Father   . Heart disease Father        CHF  . Gout Father   . Hypertension Mother   . Breast cancer Maternal Aunt   . Cancer Maternal Aunt   . Breast cancer Cousin   . Cancer Cousin        Maternal Cousin died from breast cancer    Social History   Socioeconomic History  . Marital status: Legally Separated    Spouse name: Not on file  . Number of children: 3  . Years of education: Not on file  . Highest education level: High school graduate  Occupational History  . Not on file  Tobacco Use  . Smoking status: Former Research scientist (life sciences)  . Smokeless tobacco: Never Used  Vaping Use  . Vaping Use: Never used  Substance and Sexual Activity  . Alcohol use: Yes    Comment: occasionally  . Drug use: No  . Sexual activity: Yes  Birth control/protection: Surgical  Other Topics Concern  . Not on file  Social History Narrative  . Not on file   Social Determinants of Health   Financial Resource Strain: Not on file  Food Insecurity: No Food Insecurity  . Worried About Charity fundraiser in the Last Year: Never true  . Ran Out of Food in the Last Year: Never true  Transportation Needs: No Transportation Needs  . Lack of Transportation (Medical): No  . Lack of Transportation (Non-Medical): No  Physical Activity: Not on file  Stress: Not on file  Social Connections: Not on file    Review of Systems: A 12 point ROS discussed and pertinent positives are indicated in the HPI above.  All other systems are negative.  Review of  Systems  Vital Signs: BP (!) 155/94 (BP Location: Left Arm)   Pulse (!) 102   SpO2 100%   Physical Exam Constitutional:      Appearance: Normal appearance.  Cardiovascular:     Rate and Rhythm: Regular rhythm. Tachycardia present.  Pulmonary:     Effort: Pulmonary effort is normal.     Breath sounds: Normal breath sounds.  Abdominal:     General: Bowel sounds are normal.     Palpations: Abdomen is soft. There is no mass.  Skin:    General: Skin is warm and dry.  Neurological:     Mental Status: She is alert and oriented to person, place, and time.  Psychiatric:        Mood and Affect: Mood normal.     Imaging: IR Radiologist Eval & Mgmt  Result Date: 07/12/2020 Please refer to notes tab for details about interventional procedure. (Op Note)   Labs:  CBC: Recent Labs    07/21/19 1010 12/27/19 1638  WBC 6.4 9.3  HGB 9.5* 7.2*  HCT 29.0* 25.5*  PLT 411 399    COAGS: No results for input(s): INR, APTT in the last 8760 hours.  BMP: Recent Labs    02/09/20 1557  NA 140  K 4.3  CL 106  CO2 20  GLUCOSE 81  BUN 11  CALCIUM 9.7  CREATININE 0.65  GFRNONAA 108  GFRAA 125    LIVER FUNCTION TESTS: No results for input(s): BILITOT, AST, ALT, ALKPHOS, PROT, ALBUMIN in the last 8760 hours.  TUMOR MARKERS: No results for input(s): AFPTM, CEA, CA199, CHROMGRNA in the last 8760 hours.  Assessment and Plan:  Assessment: Rebecca Joseph is a 45 y.o. female presenting to IR clinic for discussion of symptomatic uterine fibroids.  Her primary symptoms are bleeding and cramping, and she has previously tried hormonal therapy.  The patient was counseled on options for treatment of uterine fibroids with their accompanying symptoms of heavy menses, painful periods, and/or bulk symptoms including doing nothing, hormonal therapy, myomectomy, hysterectomy, and uterine artery embolization.  We discussed uterine artery embolization ([place]) as a potential therapy.  We  described the procedure itself, including the possibility of using analgesia for pain control, a bladder catheter, and admission to the hospital for a 23 hour inpatient observation period.  The procedure is done under conscious sedation.  We discussed the use of Hypogastric plexus block to help with pain control.  I also recommended that she take two weeks off of work to allow for appropriate time for healing and recovery.  We quoted an efficacy of 90% for improvement of menorrhagia and of 75% for improvement of bulk symptoms at one year related to fibroids.  We informed  the patient that approximately 80% of those patients described sustained benefits from the procedure at 5 years.  If the patient also had adenomyosis as a potential contributing cause for her symptoms, we quoted a higher rate of recurrent symptoms after [place], being 30-50% at 3-5 years.    We discussed a small risk of requiring emergent or semi-emergent hysterectomy due a procedural complication, as well an as approximately 10% chance of early onset menopause, mostly in women >39 years of age, with a less than 1% risk for women less than 66 years of age.    We discussed the need to evaluate the ovarian arteries and in some cases the need to treat the fibroids via the ovarian arteries which can lead to a higher risk of early menopause.    We also briefly discussed surgical options of treatment offered by our Gynecology colleagues with hysterectomy, myomectomy and/or endometrial ablation.    She would like to take some time to consider what we have discussed today.  She will call us and let us know if she wants to move forward with the procedure.  Plan: If she chooses to move forward with [place], we will need to obtain an endometrial biopsy and MRI of the pelvis.         Thank you for this interesting consult.  I greatly enjoyed meeting Rebecca Joseph and look forward to participating in their care.  A copy of this report was  sent to the requesting provider on this date.  Electronically Signed: Paula Libra Evynn Boutelle 07/12/2020, 11:11 AM   I spent a total of  40 Minutes  in face to face in clinical consultation, greater than 50% of which was counseling/coordinating care for symptomatic fibroids and Uterine artery embolization.

## 2020-07-24 ENCOUNTER — Other Ambulatory Visit: Payer: Self-pay

## 2020-07-24 ENCOUNTER — Encounter: Payer: Self-pay | Admitting: Nurse Practitioner

## 2020-07-24 ENCOUNTER — Ambulatory Visit: Payer: 59 | Attending: Nurse Practitioner | Admitting: Nurse Practitioner

## 2020-07-24 VITALS — BP 153/101 | HR 116 | Resp 21 | Ht 65.0 in | Wt 185.2 lb

## 2020-07-24 DIAGNOSIS — D500 Iron deficiency anemia secondary to blood loss (chronic): Secondary | ICD-10-CM | POA: Diagnosis not present

## 2020-07-24 DIAGNOSIS — M1A4710 Other secondary chronic gout, right ankle and foot, without tophus (tophi): Secondary | ICD-10-CM | POA: Diagnosis not present

## 2020-07-24 DIAGNOSIS — F10230 Alcohol dependence with withdrawal, uncomplicated: Secondary | ICD-10-CM

## 2020-07-24 DIAGNOSIS — Z1211 Encounter for screening for malignant neoplasm of colon: Secondary | ICD-10-CM

## 2020-07-24 DIAGNOSIS — M109 Gout, unspecified: Secondary | ICD-10-CM

## 2020-07-24 DIAGNOSIS — R000 Tachycardia, unspecified: Secondary | ICD-10-CM | POA: Diagnosis not present

## 2020-07-24 DIAGNOSIS — I10 Essential (primary) hypertension: Secondary | ICD-10-CM | POA: Diagnosis not present

## 2020-07-24 DIAGNOSIS — E783 Hyperchylomicronemia: Secondary | ICD-10-CM

## 2020-07-24 MED ORDER — METOPROLOL SUCCINATE ER 25 MG PO TB24: 50.0000 mg | ORAL_TABLET | Freq: Every day | ORAL | 0 refills | Status: DC

## 2020-07-24 NOTE — Patient Instructions (Signed)
[facility]  Addiction treatment center [address]  [phone] Closes soon ? 2:30PM  [facility] [place]  [phone] Open ? Closes 9PM Addiction treatment center [place]  [phone]  [facility] [place]  [phone] Closed ? [facility] in [place], [place] Address: [address], [place], [place] [address] Hours:  Open 24 hours Phone: [phone]

## 2020-07-24 NOTE — Progress Notes (Signed)
Assessment & Plan:  Rebecca Joseph was seen today for follow-up.  Diagnoses and all orders for this visit:  Essential hypertension -     CMP14+EGFR  Tachycardia -     metoprolol succinate (TOPROL-XL) 25 MG 24 hr tablet; Take 2 tablets (50 mg total) by mouth daily.  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 These are reviewed and up-to-date. Referrals have been placed accordingly. Immunizations are up-to-date or declined.    Subjective:   Chief Complaint  Patient presents with  . Follow-up   HPI Rebecca Joseph Few 45 y.o. female presents to office today for follow up. She has a past medical history of Anemia, Gout, Hyperlipidemia, and Hypertension.  Seeing Uro/Gyn for mixed incontinence. Taking oxybutynin   Essential Hypertension Not well controlled despite medication adherence (losartan 100 mg daily, toprol XL 25 mg daily). After further discusion [name] explains to me that she has a history of chronic ETOH dependence and has recently tried (a few days ago) to completely stop drinking alcohol (cold [place]). Usual intake is 2-3 glasses every night of liquor. Today she is feeling "shaky", nervous, and tearful. For tachycardia will increase toprol xl to 50 mg today. She will evaluate blood pressures at home and send readings through my chart. Denies chest pain, shortness of breath BP Readings from Last 3 Encounters:  07/24/20 (!) 153/101   07/12/20 (!) 155/94  06/23/20 (!) 144/93     Depression screen PHQ 2/9 07/24/2020 05/11/2020 02/09/2020 11/04/2019 09/21/2019  Decreased Interest 0 0 0 0 0  Down, Depressed, Hopeless 0 0 0 0 0  PHQ - 2 Score 0 0 0 0 0  Altered sleeping 2 0 0 0 0  Tired, decreased energy 0 0 0 0 0  Change in appetite 0 0 0 0 0  Feeling bad or failure about yourself  0 0 0 0 0  Trouble concentrating 0 0 0 0 0  Moving slowly or fidgety/restless 0 0 0 0 0  Suicidal thoughts 0 0 0 0 0  PHQ-9 Score 2 0 0 0 0  Difficult doing work/chores - - - - Not difficult at all  Some recent data might be hidden   GAD 7 : Generalized Anxiety Score 07/24/2020 05/11/2020 02/09/2020 11/04/2019  Nervous, Anxious, on Edge 1 0 0 0  Control/stop worrying 2 0 1 0  Worry too much - different things 3 0 1 0  Trouble relaxing 2 0 0 0  Restless 0 0 0 0  Easily annoyed or irritable 0 0 0 0  Afraid - awful might happen 0 0 0 0  Total GAD 7 Score 8 0 2 0  Anxiety Difficulty - - - -      Essential Hypertension Not well controlled. She endorses medication adherence taking toprol xl 25 mg daily, losartan 100 mg daily.  BP Readings from Last 3 Encounters:  07/24/20 (!) 153/101  07/12/20 (!) 155/94  06/23/20 Marland Kitchen)  144/93    Review of Systems  Constitutional: Negative for fever, malaise/fatigue and weight loss.  HENT: Negative.  Negative for nosebleeds.   Eyes: Negative.  Negative for blurred vision, double vision and photophobia.  Respiratory: Negative.  Negative for cough and shortness of breath.   Cardiovascular: Negative.  Negative for chest pain, palpitations and leg swelling.  Gastrointestinal: Negative.  Negative for heartburn, nausea and vomiting.  Musculoskeletal: Negative.  Negative for myalgias.  Neurological: Positive for sensory change. Negative for dizziness, focal weakness, seizures and headaches.  Psychiatric/Behavioral: Positive for substance abuse. Negative for suicidal ideas. The patient is nervous/anxious.      Past Medical History:  Diagnosis Date  . Anemia   . Gout   . Hyperlipidemia   . Hypertension     Past Surgical History:  Procedure Laterality Date  . CESAREAN SECTION    . IR RADIOLOGIST EVAL & MGMT  07/12/2020  . TUBAL LIGATION      Family History  Problem Relation Age of Onset  . Diabetes Father   . Hypertension Father   . Heart disease Father        CHF  . Gout Father   . Hypertension Mother   . Breast cancer Maternal Aunt   . Cancer Maternal Aunt   . Breast cancer Cousin   . Cancer Cousin        Maternal Cousin died from breast cancer    Social History Reviewed with no changes to be made today.   Outpatient Medications Prior to Visit  Medication Sig Dispense Refill  . Ferrous Sulfate (IRON PO) Take by mouth.    . losartan (COZAAR) 100 MG tablet Take 1 tablet (100 mg total) by mouth daily. Please fill as a 90 day supply 90 tablet 0  . norethindrone (AYGESTIN) 5 MG tablet Take 3 tablets (15 mg total) by mouth daily. 90 tablet 2  . oxybutynin (DITROPAN-XL) 5 MG 24 hr tablet Take 1 tablet (5 mg total) by mouth daily. 30 tablet 5  . allopurinol (ZYLOPRIM) 100 MG tablet Take 2 tablets (200 mg total) by mouth daily. 180 tablet 2  . gemfibrozil (LOPID) 600 MG tablet Take 1 tablet (600 mg total) by mouth 2 (two) times daily before a meal. 180 tablet 3  . triamcinolone (KENALOG) 0.025 % ointment Apply 1 application topically 2 (two) times daily. (Patient not taking: Reported on 07/24/2020) 30 g 0  . metoprolol succinate (TOPROL-XL) 25 MG 24 hr tablet Take 1 tablet (25 mg total) by mouth daily. 90 tablet 0   No facility-administered medications prior to visit.    Allergies  Allergen Reactions  . Naproxen Shortness Of Breath       Objective:    BP (!) 153/101   Pulse (!) 116   Resp (!) 21   Ht 5' 5"  (1.651 m)   Wt 185 lb 3.2 oz (84 kg)   SpO2 97%   BMI 30.82 kg/m  Wt Readings from Last 3 Encounters:  07/24/20 185 lb 3.2 oz (84 kg)  06/23/20 185 lb (83.9 kg)   05/11/20 183 lb (83 kg)    Physical Exam Vitals and nursing note reviewed.  Constitutional:      Appearance: She is well-developed.  HENT:     Head: Normocephalic and atraumatic.  Cardiovascular:     Rate and Rhythm: Regular rhythm. Tachycardia present.     Heart sounds: Normal heart sounds. No murmur heard. No friction rub. No gallop.   Pulmonary:  Effort: Pulmonary effort is normal. No tachypnea or respiratory distress.     Breath sounds: Normal breath sounds. No decreased breath sounds, wheezing, rhonchi or rales.  Chest:     Chest wall: No tenderness.  Abdominal:     General: Bowel sounds are normal.     Palpations: Abdomen is soft.  Musculoskeletal:        General: Normal range of motion.     Cervical back: Normal range of motion.  Skin:    General: Skin is warm and dry.  Neurological:     Mental Status: She is alert and oriented to person, place, and time.     Coordination: Coordination normal.  Psychiatric:        Attention and Perception: Attention normal.        Mood and Affect: Affect is tearful.        Speech: Speech normal.        Behavior: Behavior normal. Behavior is cooperative.        Thought Content: Thought content normal. Thought content is not paranoid or delusional. Thought content does not include homicidal or suicidal ideation. Thought content does not include homicidal or suicidal plan.        Cognition and Memory: Cognition normal.        Judgment: Judgment normal.          Patient has been counseled extensively about nutrition and exercise as well as the importance of adherence with medications and regular follow-up. The patient was given clear instructions to go to ER or return to medical center if symptoms don't improve, worsen or new problems develop. The patient verbalized understanding.   Follow-up: Return in about 3 months (around 10/23/2020).   [name], FNP-BC [facility] [place],  [place]   07/25/2020, 8:14 AM

## 2020-07-25 ENCOUNTER — Encounter: Payer: Self-pay | Admitting: Nurse Practitioner

## 2020-07-25 LAB — LIPID PANEL
Chol/HDL Ratio: 3.8 ratio (ref 0.0–4.4)
Cholesterol, Total: 148 mg/dL (ref 100–199)
HDL: 39 mg/dL — ABNORMAL LOW (ref >39)
LDL Chol Calc (NIH): 76 mg/dL (ref 0–99)
Triglycerides: 195 mg/dL — ABNORMAL HIGH (ref 0–149)
VLDL Cholesterol Cal: 33 mg/dL (ref 5–40)

## 2020-07-25 LAB — CMP14+EGFR
ALT: 26 IU/L (ref 0–32)
AST: 37 IU/L (ref 0–40)
Albumin/Globulin Ratio: 1.2 (ref 1.2–2.2)
Albumin: 4 g/dL (ref 3.8–4.8)
Alkaline Phosphatase: 59 IU/L (ref 44–121)
BUN/Creatinine Ratio: 14 (ref 9–23)
BUN: 10 mg/dL (ref 6–24)
Bilirubin Total: 0.6 mg/dL (ref 0.0–1.2)
CO2: 20 mmol/L (ref 20–29)
Calcium: 9.4 mg/dL (ref 8.7–10.2)
Chloride: 100 mmol/L (ref 96–106)
Creatinine, Ser: 0.73 mg/dL (ref 0.57–1.00)
Globulin, Total: 3.3 g/dL (ref 1.5–4.5)
Glucose: 102 mg/dL — ABNORMAL HIGH (ref 65–99)
Potassium: 4.1 mmol/L (ref 3.5–5.2)
Sodium: 138 mmol/L (ref 134–144)
Total Protein: 7.3 g/dL (ref 6.0–8.5)
eGFR: 103 mL/min/{1.73_m2} (ref >59)

## 2020-07-25 LAB — CBC
Hematocrit: 38.4 % (ref 34.0–46.6)
Hemoglobin: 12 g/dL (ref 11.1–15.9)
MCH: 25.1 pg — ABNORMAL LOW (ref 26.6–33.0)
MCHC: 31.3 g/dL — ABNORMAL LOW (ref 31.5–35.7)
MCV: 80 fL (ref 79–97)
Platelets: 283 10*3/uL (ref 150–450)
RBC: 4.78 x10E6/uL (ref 3.77–5.28)
RDW: 16.1 % — ABNORMAL HIGH (ref 11.7–15.4)
WBC: 8.5 10*3/uL (ref 3.4–10.8)

## 2020-07-25 LAB — URIC ACID: Uric Acid: 4.1 mg/dL (ref 2.6–6.2)

## 2020-08-07 ENCOUNTER — Other Ambulatory Visit: Payer: Self-pay | Admitting: Nurse Practitioner

## 2020-08-07 DIAGNOSIS — I10 Essential (primary) hypertension: Secondary | ICD-10-CM

## 2020-08-09 ENCOUNTER — Encounter: Payer: Self-pay | Admitting: Obstetrics and Gynecology

## 2020-08-09 ENCOUNTER — Ambulatory Visit (INDEPENDENT_AMBULATORY_CARE_PROVIDER_SITE_OTHER): Payer: 59 | Admitting: Obstetrics and Gynecology

## 2020-08-09 ENCOUNTER — Other Ambulatory Visit: Payer: Self-pay

## 2020-08-09 VITALS — BP 147/101 | HR 103 | Ht 65.0 in | Wt 185.0 lb

## 2020-08-09 DIAGNOSIS — N3281 Overactive bladder: Secondary | ICD-10-CM

## 2020-08-09 MED ORDER — OXYBUTYNIN CHLORIDE ER 10 MG PO TB24: 10.0000 mg | ORAL_TABLET | Freq: Every day | ORAL | 5 refills | Status: DC

## 2020-08-09 NOTE — Progress Notes (Signed)
[facility] Return Visit  SUBJECTIVE  History of Present Illness: Rebecca Joseph is a 45 y.o. female seen in follow-up for overactive bladder. Plan at last visit was to start oxybutynin 5mg  XL and reduce tea intake.   Leaking has decreased- able to get to the bathroom better. Occasionally will still have some leakage but overall able to control when she has the urge. Has decreased her tea and alcohol intake since she noticed these are irritating.   Having a UFE soon- was told her fibroid was close to her bladder so she is wondering if this procedure will have a positive effect on her symptoms.   Past Medical History: Patient  has a past medical history of Anemia, Gout, Hyperlipidemia, and Hypertension.   Past Surgical History: She  has a past surgical history that includes Tubal ligation; Cesarean section; and IR Radiologist Eval & Mgmt (07/12/2020).   Medications: She has a current medication list which includes the following prescription(s): ferrous sulfate, losartan, metoprolol succinate, norethindrone, oxybutynin, oxybutynin, triamcinolone, allopurinol, and gemfibrozil.   Allergies: Patient is allergic to naproxen.   Social History: Patient  reports that she has quit smoking. She has never used smokeless tobacco. She reports current alcohol use. She reports that she does not use drugs.      OBJECTIVE     Physical Exam: Vitals:   08/09/20 1050  BP: (!) 147/101  Pulse: (!) 103  Weight: 185 lb (83.9 kg)  Height: 5\' 5"  (1.651 m)   Gen: No apparent distress, A&O x 3.  Detailed Urogynecologic Evaluation:  Deferred.    ASSESSMENT AND PLAN    Rebecca Joseph is a 45 y.o. with:  1. Overactive bladder    - Doing well on 5mg  oxybutynin but still feels there is room for improvement. Will increase dose to 10mg  XL. New prescription sent to the pharmacy.  - BP elevated today, she is following with her PCP to adjust her meds.   Will follow up in 4 months.    Jaquita Folds, MD  Time spent: I spent 20 minutes dedicated to the care of this patient on the date of this encounter to include pre-visit review of records, face-to-face time with the patient and post visit documentation and ordering medication/ testing.

## 2020-08-09 NOTE — Patient Instructions (Signed)
I have increased your oxybutynin to 10mg  daily.

## 2020-08-16 ENCOUNTER — Other Ambulatory Visit: Payer: Self-pay | Admitting: Nurse Practitioner

## 2020-08-16 DIAGNOSIS — R000 Tachycardia, unspecified: Secondary | ICD-10-CM

## 2020-09-22 ENCOUNTER — Other Ambulatory Visit: Payer: Self-pay | Admitting: Family Medicine

## 2020-10-16 ENCOUNTER — Encounter: Payer: Self-pay | Admitting: Nurse Practitioner

## 2020-10-17 ENCOUNTER — Other Ambulatory Visit: Payer: Self-pay | Admitting: Nurse Practitioner

## 2020-10-17 MED ORDER — VALACYCLOVIR HCL 1 G PO TABS: 2000.0000 mg | ORAL_TABLET | Freq: Two times a day (BID) | ORAL | 1 refills | Status: AC

## 2020-10-25 ENCOUNTER — Ambulatory Visit: Payer: 59 | Attending: Nurse Practitioner | Admitting: Nurse Practitioner

## 2020-10-25 ENCOUNTER — Encounter: Payer: Self-pay | Admitting: Nurse Practitioner

## 2020-10-25 ENCOUNTER — Other Ambulatory Visit: Payer: Self-pay

## 2020-10-25 DIAGNOSIS — E559 Vitamin D deficiency, unspecified: Secondary | ICD-10-CM

## 2020-10-25 DIAGNOSIS — F419 Anxiety disorder, unspecified: Secondary | ICD-10-CM | POA: Diagnosis not present

## 2020-10-25 DIAGNOSIS — I10 Essential (primary) hypertension: Secondary | ICD-10-CM

## 2020-10-25 DIAGNOSIS — D500 Iron deficiency anemia secondary to blood loss (chronic): Secondary | ICD-10-CM

## 2020-10-25 DIAGNOSIS — Z1211 Encounter for screening for malignant neoplasm of colon: Secondary | ICD-10-CM

## 2020-10-25 DIAGNOSIS — M109 Gout, unspecified: Secondary | ICD-10-CM | POA: Diagnosis not present

## 2020-10-25 DIAGNOSIS — E783 Hyperchylomicronemia: Secondary | ICD-10-CM

## 2020-10-25 DIAGNOSIS — F32A Depression, unspecified: Secondary | ICD-10-CM

## 2020-10-25 DIAGNOSIS — Z1231 Encounter for screening mammogram for malignant neoplasm of breast: Secondary | ICD-10-CM

## 2020-10-25 MED ORDER — CARVEDILOL 6.25 MG PO TABS: 6.2500 mg | ORAL_TABLET | Freq: Two times a day (BID) | ORAL | 3 refills | Status: DC

## 2020-10-25 MED ORDER — GEMFIBROZIL 600 MG PO TABS
600.0000 mg | ORAL_TABLET | Freq: Two times a day (BID) | ORAL | 3 refills | Status: DC
Start: 2020-10-25 — End: 2021-09-09

## 2020-10-25 MED ORDER — ALLOPURINOL 100 MG PO TABS: 200.0000 mg | ORAL_TABLET | Freq: Every day | ORAL | 2 refills | Status: DC

## 2020-10-25 MED ORDER — HYDROXYZINE HCL 25 MG PO TABS: 12.5000 mg | ORAL_TABLET | Freq: Three times a day (TID) | ORAL | 3 refills | Status: DC | PRN

## 2020-10-25 NOTE — Progress Notes (Signed)
Virtual Visit Note Due to national recommendations of social distancing due to [place] 19, virtual visit is felt to be most appropriate for this patient at this time.  I discussed the limitations, risks, security and privacy concerns of performing an evaluation and management service by video and the availability of in person appointments. I also discussed with the patient that there may be a patient responsible charge related to this service. The patient expressed understanding and agreed to proceed.    I connected with Rebecca Joseph on 10/25/20  at   9:10 AM EDT  EDT by VIDEO and verified that I am speaking with the correct person using two identifiers.   Location of Patient: Private Residence   Location of Provider: [place] participating in VIRTUAL visit: [name] FNP-BC Rebecca Joseph    History of Present Illness: VIRTUAL visit for: Anxiety She has a past medical history of Anemia, Gout, Hyperlipidemia, and Hypertension.   Currently being evaluated by GYN for fibroids and AUB. Has not decided on course of treatment at this time. States she wants to think about it.   Anxiety: Patient complains of anxiety disorder and sleep disturbance.  She has the following symptoms: difficulty concentrating, insomnia, irritable, racing thoughts. Onset of symptoms was approximately several months ago, gradually worsening since that time. She denies current suicidal and homicidal ideation. Family history significant for anxiety.Possible organic causes contributing are: none. Risk factors: positive family history in  mother and siblings,  Previous treatment includes  NONE     Essential Hypertension Poorly controlled. She had BLE edema with amlodipine, gout flare with HCTZ and chlorthalidone. Will keep on losartan 100 mg and switch metoprolol to carvedilol 6.25 mg BID. She will need to see the pharmacist for BP check in a few weeks.  Denies chest pain, shortness of breath, palpitations, lightheadedness, dizziness, headaches or BLE edema.   BP Readings from Last 3 Encounters:  08/09/20 (!) 147/101  07/24/20 (!) 153/101  07/12/20 (!) 155/94      Past Medical History:  Diagnosis Date   Anemia    Gout    Hyperlipidemia    Hypertension     Past Surgical History:  Procedure Laterality Date   CESAREAN SECTION     IR RADIOLOGIST EVAL & MGMT  07/12/2020   TUBAL LIGATION      Family History  Problem Relation Age of Onset   Diabetes Father    Hypertension Father    Heart disease Father        CHF   Gout Father    Hypertension Mother    Breast cancer Maternal Aunt    Cancer Maternal Aunt    Breast cancer Cousin    Cancer Cousin        Maternal Cousin died from breast cancer    Social History   Socioeconomic History   Marital status: Legally Separated    Spouse name: Not on file   Number of children: 3   Years of education: Not on file   Highest education level: High school graduate  Occupational History   Not on file  Tobacco Use   Smoking status: Former   Smokeless tobacco: Never  Scientific laboratory technician Use: Never used  Substance and Sexual Activity   Alcohol use: Yes    Comment: occasionally   Drug use: No   Sexual activity: Yes    Birth control/protection: Surgical  Other Topics Concern   Not on  file  Social History Narrative   Not on file   Social Determinants of Health   Financial Resource Strain: Not on file  Food Insecurity: No Food Insecurity   Worried About Charity fundraiser in the Last Year: Never true   Ran Out of Food in the Last Year: Never true  Transportation Needs: No Transportation Needs   Lack of Transportation (Medical): No   Lack of Transportation (Non-Medical): No  Physical Activity: Not on file  Stress: Not on file  Social Connections: Not on file     Observations/Objective: Awake, alert and oriented x 3   Review of Systems  Constitutional:  Negative for  fever, malaise/fatigue and weight loss.  HENT: Negative.  Negative for nosebleeds.   Eyes: Negative.  Negative for blurred vision, double vision and photophobia.  Respiratory: Negative.  Negative for cough and shortness of breath.   Cardiovascular: Negative.  Negative for chest pain, palpitations and leg swelling.  Gastrointestinal: Negative.  Negative for heartburn, nausea and vomiting.  Musculoskeletal: Negative.  Negative for joint pain (denies any gout flare symptoms) and myalgias.  Neurological: Negative.  Negative for dizziness, focal weakness, seizures and headaches.  Psychiatric/Behavioral:  Negative for suicidal ideas. The patient is nervous/anxious and has insomnia.    Assessment and Plan: Diagnoses and all orders for this visit:  Essential hypertension -     CMP14+EGFR  Anxiety and depression  Gout of right foot, unspecified cause, unspecified chronicity -     allopurinol (ZYLOPRIM) 100 MG tablet; Take 2 tablets (200 mg total) by mouth daily.  Mixed hyperglyceridemia -     gemfibrozil (LOPID) 600 MG tablet; Take 1 tablet (600 mg total) by mouth 2 (two) times daily before a meal.  Colon cancer screening -     Ambulatory referral to Gastroenterology  Breast cancer screening by mammogram -     MM DIGITAL SCREENING BILATERAL; Future  Vitamin D deficiency disease -     VITAMIN D 25 Hydroxy (Vit-D Deficiency, Fractures)  Iron deficiency anemia due to chronic blood loss -     CBC  Other orders -     carvedilol (COREG) 6.25 MG tablet; Take 1 tablet (6.25 mg total) by mouth 2 (two) times daily with a meal. -     hydrOXYzine (ATARAX/VISTARIL) 25 MG tablet; Take 0.5-1 tablets (12.5-25 mg total) by mouth 3 (three) times daily as needed.    Follow Up Instructions Return in about 3 months (around 01/25/2021).     I discussed the assessment and treatment plan with the patient. The patient was provided an opportunity to ask questions and all were answered. The patient agreed  with the plan and demonstrated an understanding of the instructions.   The patient was advised to call back or seek an in-person evaluation if the symptoms worsen or if the condition fails to improve as anticipated.  I provided 15 minutes of face-to-face time during this encounter including median intraservice time, reviewing previous notes, labs, imaging, medications and explaining diagnosis and management.  [name], FNP-BC

## 2020-11-03 ENCOUNTER — Other Ambulatory Visit: Payer: Self-pay | Admitting: Nurse Practitioner

## 2020-11-03 DIAGNOSIS — I10 Essential (primary) hypertension: Secondary | ICD-10-CM

## 2020-11-21 ENCOUNTER — Encounter: Payer: Self-pay | Admitting: Nurse Practitioner

## 2020-11-22 ENCOUNTER — Encounter: Payer: Self-pay | Admitting: Nurse Practitioner

## 2020-11-22 ENCOUNTER — Other Ambulatory Visit: Payer: Self-pay | Admitting: Nurse Practitioner

## 2020-11-22 DIAGNOSIS — R000 Tachycardia, unspecified: Secondary | ICD-10-CM

## 2020-11-22 MED ORDER — MOLNUPIRAVIR EUA 200MG CAPSULE: 4.0000 | ORAL_CAPSULE | Freq: Two times a day (BID) | ORAL | 0 refills | Status: AC

## 2020-11-22 NOTE — Telephone Encounter (Signed)
Requested medications are due for refill today.  Unknown  Requested medications are on the active medications list.  no  Last refill. 08/16/2020  Future visit scheduled.   no  Notes to clinic.  Medication was discontinued 10/25/2020.

## 2020-11-24 DIAGNOSIS — U071 COVID-19: Secondary | ICD-10-CM

## 2020-11-24 HISTORY — DX: COVID-19: U07.1

## 2020-12-07 NOTE — Progress Notes (Deleted)
[facility] Return Visit  SUBJECTIVE  History of Present Illness: Rebecca Joseph is a 45 y.o. female seen in follow-up for overactive bladder. Plan at last visit was to increase oxybutynin to '10mg'$  daily.     Past Medical History: Patient  has a past medical history of Anemia, Gout, Hyperlipidemia, and Hypertension.   Past Surgical History: She  has a past surgical history that includes Tubal ligation; Cesarean section; and IR Radiologist Eval & Mgmt (07/12/2020).   Medications: She has a current medication list which includes the following prescription(s): allopurinol, carvedilol, ferrous sulfate, gemfibrozil, losartan, norethindrone, oxybutynin, oxybutynin, and triamcinolone.   Allergies: Patient is allergic to naproxen, amlodipine, and hctz [hydrochlorothiazide].   Social History: Patient  reports that she has quit smoking. She has never used smokeless tobacco. She reports current alcohol use. She reports that she does not use drugs.      OBJECTIVE     Physical Exam: There were no vitals filed for this visit. Gen: No apparent distress, A&O x 3.  Detailed Urogynecologic Evaluation:  Deferred. Prior exam showed:  No flowsheet data found.     ASSESSMENT AND PLAN    Rebecca Joseph is a 45 y.o. with:  No diagnosis found.

## 2020-12-12 ENCOUNTER — Ambulatory Visit: Payer: 59 | Admitting: Obstetrics and Gynecology

## 2020-12-18 ENCOUNTER — Inpatient Hospital Stay (HOSPITAL_COMMUNITY): Payer: 59

## 2020-12-18 ENCOUNTER — Other Ambulatory Visit: Payer: Self-pay

## 2020-12-18 ENCOUNTER — Inpatient Hospital Stay (HOSPITAL_COMMUNITY)
Admission: AD | Admit: 2020-12-18 | Discharge: 2020-12-18 | Disposition: A | Discharge status: Home / Self Care | Payer: 59 | Attending: Emergency Medicine | Admitting: Emergency Medicine

## 2020-12-18 DIAGNOSIS — R1030 Lower abdominal pain, unspecified: Secondary | ICD-10-CM

## 2020-12-18 DIAGNOSIS — D259 Leiomyoma of uterus, unspecified: Secondary | ICD-10-CM | POA: Diagnosis not present

## 2020-12-18 DIAGNOSIS — N939 Abnormal uterine and vaginal bleeding, unspecified: Secondary | ICD-10-CM | POA: Diagnosis not present

## 2020-12-18 DIAGNOSIS — R102 Pelvic and perineal pain: Secondary | ICD-10-CM | POA: Insufficient documentation

## 2020-12-18 DIAGNOSIS — Z87891 Personal history of nicotine dependence: Secondary | ICD-10-CM | POA: Diagnosis not present

## 2020-12-18 LAB — I-STAT CHEM 8, ED
BUN: 6 mg/dL (ref 6–20)
Calcium, Ion: 1.18 mmol/L (ref 1.15–1.40)
Chloride: 104 mmol/L (ref 98–111)
Creatinine, Ser: 0.6 mg/dL (ref 0.44–1.00)
Glucose, Bld: 126 mg/dL — ABNORMAL HIGH (ref 70–99)
HCT: 41 % (ref 36.0–46.0)
Hemoglobin: 13.9 g/dL (ref 12.0–15.0)
Potassium: 3.7 mmol/L (ref 3.5–5.1)
Sodium: 136 mmol/L (ref 135–145)
TCO2: 20 mmol/L — ABNORMAL LOW (ref 22–32)

## 2020-12-18 LAB — I-STAT BETA HCG BLOOD, ED (MC, WL, AP ONLY): I-stat hCG, quantitative: <5 m[IU]/mL (ref <5)

## 2020-12-18 LAB — CBC
HCT: 38.3 % (ref 36.0–46.0)
Hemoglobin: 12.1 g/dL (ref 12.0–15.0)
MCH: 25.8 pg — ABNORMAL LOW (ref 26.0–34.0)
MCHC: 31.6 g/dL (ref 30.0–36.0)
MCV: 81.7 fL (ref 80.0–100.0)
Platelets: 257 10*3/uL (ref 150–400)
RBC: 4.69 MIL/uL (ref 3.87–5.11)
RDW: 17.6 % — ABNORMAL HIGH (ref 11.5–15.5)
WBC: 12.2 10*3/uL — ABNORMAL HIGH (ref 4.0–10.5)
nRBC: 0 % (ref 0.0–0.2)

## 2020-12-18 LAB — COMPREHENSIVE METABOLIC PANEL
ALT: 27 U/L (ref 0–44)
AST: 39 U/L (ref 15–41)
Albumin: 3.5 g/dL (ref 3.5–5.0)
Alkaline Phosphatase: 51 U/L (ref 38–126)
Anion gap: 12 (ref 5–15)
BUN: 9 mg/dL (ref 6–20)
CO2: 20 mmol/L — ABNORMAL LOW (ref 22–32)
Calcium: 9.1 mg/dL (ref 8.9–10.3)
Chloride: 104 mmol/L (ref 98–111)
Creatinine, Ser: 0.85 mg/dL (ref 0.44–1.00)
GFR, Estimated: >60 mL/min (ref >60)
Glucose, Bld: 99 mg/dL (ref 70–99)
Potassium: 3.9 mmol/L (ref 3.5–5.1)
Sodium: 136 mmol/L (ref 135–145)
Total Bilirubin: 1.3 mg/dL — ABNORMAL HIGH (ref 0.3–1.2)
Total Protein: 8.1 g/dL (ref 6.5–8.1)

## 2020-12-18 MED ORDER — IBUPROFEN 800 MG PO TABS: 800.0000 mg | ORAL_TABLET | Freq: Three times a day (TID) | ORAL | 0 refills | Status: DC

## 2020-12-18 MED ORDER — KETOROLAC TROMETHAMINE 30 MG/ML IJ SOLN
30.0000 mg | Freq: Once | INTRAMUSCULAR | Status: AC
  Administered 2020-12-18: 30 mg via INTRAVENOUS
  Filled 2020-12-18: qty 1

## 2020-12-18 NOTE — Discharge Instructions (Addendum)
Take the ibuprofen for pain.  Follow-up with an OB/GYN doctor for further treatment

## 2020-12-18 NOTE — MAU Note (Signed)
All last night was having pain in lower abd.  Comes and goes. Took some Tylenol and some Advil, pain still keeps coming every 8mn.  Knows she has a fibroid, doesn't know if it is something to do with that, but the pain is unbearable.  Tried to get in at [facility], "no openings".  Realized this is not the right place.

## 2020-12-18 NOTE — ED Triage Notes (Signed)
Pt arrived c/o lower abdominal pain that started last night. Pt concerned about worsening uterine fibroids. Pain comes and goes in waves 7/10 at worse.   Pt was unable to get in with her gyno this morning. Pt is not currently on her period at this time. Pt took OTC meds this morning with out relief

## 2020-12-18 NOTE — MAU Provider Note (Signed)
S [name] is a 45 y.o. [phone] non-pregnant female who presents to MAU today with complaint of LAP. Hx of uterine fibroids. She tried Ibuprofen without relief. Hx of BTL.   ROS: +abd pain  O BP (!) 141/91 (BP Location: Right Arm)   Pulse (!) 126   Temp 98.7 F (37.1 C) (Oral)   Resp 18   Wt 85.5 kg   LMP 12/04/2020   SpO2 100%   BMI 31.38 kg/m  Physical Exam Vitals and nursing note reviewed.  Constitutional:      General: She is not in acute distress. HENT:     Head: Normocephalic and atraumatic.  Pulmonary:     Effort: Pulmonary effort is normal. No respiratory distress.  Neurological:     General: No focal deficit present.     Mental Status: She is alert and oriented to person, place, and time.  Psychiatric:        Mood and Affect: Mood normal.        Behavior: Behavior normal.    MDM: No signs of pregnancy. Plan for transfer to ED. Dr. Sabra Heck aware and accepting. Stable for transfer.  A 1. Lower abdominal pain     P Transfer to [facility] for further evaluation  Warning signs for worsening condition that would warrant emergency follow-up discussed Patient may return to MAU as needed for pregnancy related complaints  Julianne Handler, [place] 12/18/2020 10:37 AM

## 2020-12-18 NOTE — ED Provider Notes (Signed)
Emergency Medicine Provider Triage Evaluation Note  Rebecca Joseph , a 45 y.o. female  was evaluated in triage.  Pt complains of history of large uterine fibroid with worsening clamping persistent through the night.  Vaginal spotting.  Review of Systems  Positive: Lower abdominal pain, vaginal spotting Negative: Nausea, vomiting, fevers, chills, chest pain, shortness of breath  Physical Exam  BP (!) 150/89 (BP Location: Right Arm)   Pulse (!) 119   Temp 98.2 F (36.8 C)   Resp 17   Ht '5\' 5"'$  (1.651 m)   Wt 85.3 kg   LMP 12/04/2020   SpO2 100%   BMI 31.28 kg/m  Gen:   Awake, no distress   Resp:  Normal effort  MSK:   Moves extremities without difficulty  Other:  Mildly tachycardic with heart rate in the 100's, patient in notable pain.  Tenderness palpation in the low central abdomen without rebound or guarding.  Medical Decision Making  Medically screening exam initiated at 11:27 AM.  Appropriate orders placed.  Rebecca Joseph was informed that the remainder of the evaluation will be completed by another provider, this initial triage assessment does not replace that evaluation, and the importance of remaining in the ED until their evaluation is complete.  This chart was dictated using voice recognition software, Dragon. Despite the best efforts of this provider to proofread and correct errors, errors may still occur which can change documentation meaning.    Rebecca Darling, PA-C 12/18/20 [place] A, DO 12/18/20 1743

## 2020-12-18 NOTE — ED Provider Notes (Signed)
[facility] EMERGENCY DEPARTMENT Provider Note   CSN: [id number] Arrival date & time: 12/18/20  1017     History Chief Complaint  Patient presents with   Abdominal Pain    Rebecca Joseph is a 45 y.o. female.   Abdominal Pain   Pt has been having severe cramping abd pain, coming and going every 10 minutes starting last night.  The sx have eased off as she was been waiting.  Pt has a fibroid and this feels similar to pain she has had before although this is worse.  No vomiting.  Mild diarrhea.  No dysuria. Not having any vaginal bleeding right now.  Past Medical History:  Diagnosis Date   Anemia    Gout    Hyperlipidemia    Hypertension     Patient Active Problem List   Diagnosis Date Noted   Incontinence in female 05/12/2020   Essential hypertension 02/09/2020   Submucous leiomyoma of uterus 11/01/2019   Menorrhagia 06/07/2019   Gout of right foot 06/03/2018   Adenomyosis 05/30/2017   ANEMIA 04/29/2008   OBESITY 04/27/2008   TOBACCO ABUSE 02/18/2008    Past Surgical History:  Procedure Laterality Date   CESAREAN SECTION     IR RADIOLOGIST EVAL & MGMT  07/12/2020   TUBAL LIGATION       OB History     Gravida  4   Para  3   Term  3   Preterm      AB  1   Living  3      SAB  1   IAB      Ectopic      Multiple      Live Births              Family History  Problem Relation Age of Onset   Diabetes Father    Hypertension Father    Heart disease Father        CHF   Gout Father    Hypertension Mother    Breast cancer Maternal Aunt    Cancer Maternal Aunt    Breast cancer Cousin    Cancer Cousin        Maternal Cousin died from breast cancer    Social History   Tobacco Use   Smoking status: Former   Smokeless tobacco: Never  Scientific laboratory technician Use: Never used  Substance Use Topics   Alcohol use: Yes    Comment: occasionally   Drug use: No    Home Medications Prior to Admission medications    Medication Sig Start Date End Date Taking? Authorizing Provider  ibuprofen (ADVIL) 800 MG tablet Take 1 tablet (800 mg total) by mouth 3 (three) times daily. 12/18/20  Yes Dorie Rank, MD  allopurinol (ZYLOPRIM) 100 MG tablet Take 2 tablets (200 mg total) by mouth daily. 10/25/20 01/23/21  [name], NP  carvedilol (COREG) 6.25 MG tablet Take 1 tablet (6.25 mg total) by mouth 2 (two) times daily with a meal. 10/25/20   [name], NP  Ferrous Sulfate (IRON PO) Take by mouth.    [provider]  gemfibrozil (LOPID) 600 MG tablet Take 1 tablet (600 mg total) by mouth 2 (two) times daily before a meal. 10/25/20 01/23/21  [name], NP  losartan (COZAAR) 100 MG tablet TAKE 1 TABLET BY MOUTH EVERY DAY 11/03/20   [name], NP  norethindrone (AYGESTIN) 5 MG tablet TAKE 3 TABLETS (15 MG  TOTAL) BY MOUTH DAILY. 09/22/20   [name], MD  oxybutynin (DITROPAN-XL) 10 MG 24 hr tablet Take 1 tablet (10 mg total) by mouth daily. 08/09/20   Jaquita Folds, MD  oxybutynin (DITROPAN-XL) 5 MG 24 hr tablet Take 1 tablet (5 mg total) by mouth daily. 06/23/20   Jaquita Folds, MD  triamcinolone (KENALOG) 0.025 % ointment Apply 1 application topically 2 (two) times daily. 06/18/20   [name], NP    Allergies    Naproxen, Amlodipine, and Hctz [hydrochlorothiazide]  Review of Systems   Review of Systems  Gastrointestinal:  Positive for abdominal pain.  All other systems reviewed and are negative.  Physical Exam Updated Vital Signs BP 116/71   Pulse (!) 118   Temp 98.2 F (36.8 C)   Resp 20   Ht 1.651 m ('5\' 5"'$ )   Wt 85.3 kg   LMP 12/04/2020   SpO2 100%   BMI 31.28 kg/m   Physical Exam Vitals and nursing note reviewed.  Constitutional:      General: She is not in acute distress.    Appearance: She is well-developed.  HENT:     Head: Normocephalic and atraumatic.     Right Ear: External ear normal.     Left Ear: External ear normal.  Eyes:      General: No scleral icterus.       Right eye: No discharge.        Left eye: No discharge.     Conjunctiva/sclera: Conjunctivae normal.  Neck:     Trachea: No tracheal deviation.  Cardiovascular:     Rate and Rhythm: Normal rate and regular rhythm.  Pulmonary:     Effort: Pulmonary effort is normal. No respiratory distress.     Breath sounds: Normal breath sounds. No stridor. No wheezing or rales.  Abdominal:     General: Bowel sounds are normal. There is no distension.     Palpations: Abdomen is soft. There is mass (pelvic region).     Tenderness: There is no abdominal tenderness. There is no guarding or rebound.  Musculoskeletal:        General: No tenderness or deformity.     Cervical back: Neck supple.  Skin:    General: Skin is warm and dry.     Findings: No rash.  Neurological:     General: No focal deficit present.     Mental Status: She is alert.     Cranial Nerves: No cranial nerve deficit (no facial droop, extraocular movements intact, no slurred speech).     Sensory: No sensory deficit.     Motor: No abnormal muscle tone or seizure activity.     Coordination: Coordination normal.  Psychiatric:        Mood and Affect: Mood normal.    ED Results / Procedures / Treatments   Labs (all labs ordered are listed, but only abnormal results are displayed) Labs Reviewed  COMPREHENSIVE METABOLIC PANEL - Abnormal; Notable for the following components:      Result Value   CO2 20 (*)    Total Bilirubin 1.3 (*)    All other components within normal limits  CBC - Abnormal; Notable for the following components:   WBC 12.2 (*)    MCH 25.8 (*)    RDW 17.6 (*)    All other components within normal limits  I-STAT CHEM 8, ED - Abnormal; Notable for the following components:   Glucose, Bld 126 (*)    TCO2  20 (*)    All other components within normal limits  I-STAT BETA HCG BLOOD, ED ([facility], [facility], [facility] ONLY)    EKG None  Radiology US PELVIC COMPLETE WITH TRANSVAGINAL  Result  Date: 12/18/2020 CLINICAL DATA:  History of uterine fibroids with pelvic pain, initial encounter EXAM: TRANSABDOMINAL AND TRANSVAGINAL ULTRASOUND OF PELVIS TECHNIQUE: Both transabdominal and transvaginal ultrasound examinations of the pelvis were performed. Transabdominal technique was performed for global imaging of the pelvis including uterus, ovaries, adnexal regions, and pelvic cul-de-sac. It was necessary to proceed with endovaginal exam following the transabdominal exam to visualize the ovaries. COMPARISON:  09/01/2019 FINDINGS: Uterus Measurements: 16.7 x 8.4 x 14.3 cm. = volume: 1058 mL. 11 cm large anterior fibroid is noted increased in size when compare with the prior exam. It previously measured up to 6 cm. Complex nabothian cysts are noted. Endometrium Thickness: 5.4 mm.  No focal abnormality visualized. Right ovary Not well visualized Left ovary Not well visualized Other findings No abnormal free fluid. IMPRESSION: Large anterior uterine fibroid which has increased in size from the prior exam at which time it measured 6 cm. Nonvisualization of the ovaries. No other focal abnormality is noted. Electronically Signed   By: Inez Catalina M.D.   On: 12/18/2020 14:03    Procedures Procedures   Medications Ordered in ED Medications  ketorolac (TORADOL) 30 MG/ML injection 30 mg (30 mg Intravenous Given 12/18/20 2124)    ED Course  I have reviewed the triage vital signs and the nursing notes.  Pertinent labs & imaging results that were available during my care of the patient were reviewed by me and considered in my medical decision making (see chart for details).  Clinical Course as of 12/18/20 2236  Mon Dec 18, 2020  2200 Pregnancy test negative.  Metabolic panel normal.  CBC does not show anemia [JK]    Clinical Course User Index [JK] Dorie Rank, MD   MDM Rules/Calculators/A&P                           Patient presented to the ED for evaluation of lower abdominal pain.  Patient denied  any fever.  No vomiting or diarrhea.  No change in appetite.  She did have known history of large uterine fibroid and had been recommended for uterine artery embolization.  Patient presenting with recurrent pain.  Abdominal exam was benign on my assessment.  Pelvic mass was appreciated but she was not tender.  No signs of infection.  Laboratory tests were reassuring.  Ultrasound did show increased size of her fibroid.  Patient noted some improvement in her pain.  Will discharge home with prescription for ibuprofen.  Recommend close follow-up with her OB/GYN to discuss further definitive treatment of her fibroids. Final Clinical Impression(s) / ED Diagnoses Final diagnoses:  Lower abdominal pain  Pelvic pain  Uterine leiomyoma, unspecified location    Rx / DC Orders ED Discharge Orders          Ordered    ibuprofen (ADVIL) 800 MG tablet  3 times daily        12/18/20 2234             Dorie Rank, MD 12/18/20 2238

## 2020-12-19 ENCOUNTER — Encounter: Payer: Self-pay | Admitting: Nurse Practitioner

## 2021-01-10 ENCOUNTER — Ambulatory Visit: Payer: 59 | Admitting: Family Medicine

## 2021-01-19 ENCOUNTER — Other Ambulatory Visit: Payer: Self-pay | Admitting: Nurse Practitioner

## 2021-01-19 NOTE — Telephone Encounter (Signed)
Requested Prescriptions  Pending Prescriptions Disp Refills  . carvedilol (COREG) 6.25 MG tablet [Pharmacy Med Name: CARVEDILOL 6.25 MG TABLET] 180 tablet 0    Sig: TAKE 1 TABLET BY MOUTH 2 TIMES DAILY WITH A MEAL.     Cardiovascular:  Beta Blockers Passed - 01/19/2021 12:13 AM      Passed - Last BP in normal range    BP Readings from Last 1 Encounters:  12/18/20 112/64         Passed - Last Heart Rate in normal range    Pulse Readings from Last 1 Encounters:  12/18/20 75         Passed - Valid encounter within last 6 months    Recent Outpatient Visits          2 months ago Essential hypertension   [place], Vernia Buff, NP   5 months ago Essential hypertension   [place], Vernia Buff, NP   11 months ago Essential hypertension   [place], RPH-CPP   1 year ago Encounter for Papanicolaou smear for cervical cancer screening   [place], Vernia Buff, NP   1 year ago Adjustment insomnia   [place], Vernia Buff, NP

## 2021-01-25 MED ORDER — IBUPROFEN 800 MG PO TABS: 800.0000 mg | ORAL_TABLET | Freq: Three times a day (TID) | ORAL | 0 refills | Status: DC | PRN

## 2021-02-03 ENCOUNTER — Other Ambulatory Visit: Payer: Self-pay | Admitting: Obstetrics and Gynecology

## 2021-02-03 DIAGNOSIS — N3281 Overactive bladder: Secondary | ICD-10-CM

## 2021-02-04 ENCOUNTER — Other Ambulatory Visit: Payer: Self-pay | Admitting: Nurse Practitioner

## 2021-02-04 DIAGNOSIS — I10 Essential (primary) hypertension: Secondary | ICD-10-CM

## 2021-02-04 NOTE — Telephone Encounter (Signed)
Called pt and LM on VM to make appt at the end of October/beginning of November. 11/03/20 #90 RF is due, active medication .   Requested Prescriptions  Pending Prescriptions Disp Refills   losartan (COZAAR) 100 MG tablet [Pharmacy Med Name: LOSARTAN POTASSIUM 100 MG TAB] 90 tablet 0    Sig: TAKE 1 TABLET BY MOUTH EVERY DAY     Cardiovascular:  Angiotensin Receptor Blockers Passed - 02/04/2021 12:06 AM      Passed - Cr in normal range and within 180 days    Creatinine, Ser  Date Value Ref Range Status  12/18/2020 0.60 0.44 - 1.00 mg/dL Final          Passed - K in normal range and within 180 days    Potassium  Date Value Ref Range Status  12/18/2020 3.7 3.5 - 5.1 mmol/L Final          Passed - Patient is not pregnant      Passed - Last BP in normal range    BP Readings from Last 1 Encounters:  12/18/20 112/64          Passed - Valid encounter within last 6 months    Recent Outpatient Visits           3 months ago Essential hypertension   [place], Vernia Buff, NP   6 months ago Essential hypertension   [place], Vernia Buff, NP   12 months ago Essential hypertension   [place], RPH-CPP   1 year ago Encounter for Papanicolaou smear for cervical cancer screening   [place], Vernia Buff, NP   1 year ago Adjustment insomnia   [place], Vernia Buff, NP

## 2021-02-07 ENCOUNTER — Ambulatory Visit
Admission: RE | Admit: 2021-02-07 | Discharge: 2021-02-07 | Disposition: A | Discharge status: Home / Self Care | Payer: 59 | Source: Ambulatory Visit | Attending: Nurse Practitioner | Admitting: Nurse Practitioner

## 2021-02-07 ENCOUNTER — Other Ambulatory Visit: Payer: Self-pay | Admitting: Nurse Practitioner

## 2021-02-07 DIAGNOSIS — R928 Other abnormal and inconclusive findings on diagnostic imaging of breast: Secondary | ICD-10-CM

## 2021-02-07 DIAGNOSIS — Z1231 Encounter for screening mammogram for malignant neoplasm of breast: Secondary | ICD-10-CM

## 2021-02-08 ENCOUNTER — Other Ambulatory Visit: Payer: Self-pay | Admitting: Nurse Practitioner

## 2021-02-08 DIAGNOSIS — R928 Other abnormal and inconclusive findings on diagnostic imaging of breast: Secondary | ICD-10-CM

## 2021-02-15 ENCOUNTER — Ambulatory Visit: Payer: 59 | Admitting: Obstetrics & Gynecology

## 2021-02-20 ENCOUNTER — Ambulatory Visit: Payer: 59 | Attending: Nurse Practitioner

## 2021-02-20 ENCOUNTER — Other Ambulatory Visit: Payer: Self-pay

## 2021-02-20 DIAGNOSIS — Z23 Encounter for immunization: Secondary | ICD-10-CM | POA: Diagnosis not present

## 2021-03-14 ENCOUNTER — Other Ambulatory Visit: Payer: 59

## 2021-03-16 ENCOUNTER — Other Ambulatory Visit: Payer: Self-pay | Admitting: Nurse Practitioner

## 2021-03-16 DIAGNOSIS — I10 Essential (primary) hypertension: Secondary | ICD-10-CM

## 2021-03-19 MED ORDER — LOSARTAN POTASSIUM 100 MG PO TABS: 100.0000 mg | ORAL_TABLET | Freq: Every day | ORAL | 0 refills | Status: DC

## 2021-04-08 DIAGNOSIS — D259 Leiomyoma of uterus, unspecified: Secondary | ICD-10-CM

## 2021-04-08 DIAGNOSIS — N3281 Overactive bladder: Secondary | ICD-10-CM

## 2021-04-08 HISTORY — DX: Overactive bladder: N32.81

## 2021-04-08 HISTORY — DX: Leiomyoma of uterus, unspecified: D25.9

## 2021-04-16 ENCOUNTER — Other Ambulatory Visit: Payer: Self-pay | Admitting: Nurse Practitioner

## 2021-04-16 NOTE — Telephone Encounter (Signed)
Requested Prescriptions  Pending Prescriptions Disp Refills   carvedilol (COREG) 6.25 MG tablet [Pharmacy Med Name: CARVEDILOL 6.25 MG TABLET] 180 tablet 0    Sig: TAKE 1 TABLET BY MOUTH 2 TIMES DAILY WITH A MEAL.     Cardiovascular:  Beta Blockers Passed - 04/16/2021  8:31 AM      Passed - Last BP in normal range    BP Readings from Last 1 Encounters:  12/18/20 112/64         Passed - Last Heart Rate in normal range    Pulse Readings from Last 1 Encounters:  12/18/20 75         Passed - Valid encounter within last 6 months    Recent Outpatient Visits          5 months ago Essential hypertension   [place], Vernia Buff, NP   8 months ago Essential hypertension   [place], Vernia Buff, NP   1 year ago Essential hypertension   [place], RPH-CPP   1 year ago Encounter for Papanicolaou smear for cervical cancer screening   [place], Vernia Buff, NP   1 year ago Adjustment insomnia   [place], Vernia Buff, NP      Future Appointments            In 1 week [name], NP [place]

## 2021-04-17 ENCOUNTER — Ambulatory Visit
Admission: RE | Admit: 2021-04-17 | Discharge: 2021-04-17 | Disposition: A | Discharge status: Home / Self Care | Payer: PRIVATE HEALTH INSURANCE | Source: Ambulatory Visit | Attending: Nurse Practitioner | Admitting: Nurse Practitioner

## 2021-04-17 ENCOUNTER — Ambulatory Visit: Admission: RE | Admit: 2021-04-17 | Payer: 59 | Source: Ambulatory Visit

## 2021-04-17 DIAGNOSIS — R928 Other abnormal and inconclusive findings on diagnostic imaging of breast: Secondary | ICD-10-CM

## 2021-04-25 ENCOUNTER — Encounter: Payer: Self-pay | Admitting: Nurse Practitioner

## 2021-04-25 ENCOUNTER — Ambulatory Visit: Payer: PRIVATE HEALTH INSURANCE | Attending: Nurse Practitioner | Admitting: Nurse Practitioner

## 2021-04-25 ENCOUNTER — Other Ambulatory Visit: Payer: Self-pay

## 2021-04-25 VITALS — BP 137/93 | HR 100 | Resp 16 | Wt 191.6 lb

## 2021-04-25 DIAGNOSIS — D500 Iron deficiency anemia secondary to blood loss (chronic): Secondary | ICD-10-CM

## 2021-04-25 DIAGNOSIS — I10 Essential (primary) hypertension: Secondary | ICD-10-CM | POA: Diagnosis not present

## 2021-04-25 MED ORDER — LOSARTAN POTASSIUM 100 MG PO TABS: 100.0000 mg | ORAL_TABLET | Freq: Every day | ORAL | 1 refills | Status: DC

## 2021-04-25 NOTE — Patient Instructions (Signed)
Please contact [facility] to schedule colonoscopy [address] [place] [address]  Ph# [phone]

## 2021-04-25 NOTE — Progress Notes (Signed)
Assessment & Plan:  Rebecca Joseph was seen today for hypertension.  Diagnoses and all orders for this visit:  Essential hypertension -     losartan (COZAAR) 100 MG tablet; Take 1 tablet (100 mg total) by mouth daily. -     CMP14+EGFR Continue all antihypertensives as prescribed.  Remember to bring in your blood pressure log with you for your follow up appointment.  DASH/Mediterranean Diets are healthier choices for HTN.    Iron deficiency anemia due to chronic blood loss -     CBC with Differential    Patient has been counseled on age-appropriate routine health concerns for screening and prevention. These are reviewed and up-to-date. Referrals have been placed accordingly. Immunizations are up-to-date or declined.    Subjective:   Chief Complaint  Patient presents with   Hypertension   HPI Rebecca Joseph 46 y.o. female presents to office today for follow up to HTN. She has a past medical history of Anemia, Gout, Hyperlipidemia, and Hypertension.   She is currently being followed by GYN-URO for urinary incontinence and GYN for menorrhagia with dysmenorrhea. She stopped taking ditropan as she reports it was not effective. She only took it for a few weeks. I have recommended she take it for 4-6 weeks and If no improvement contact GYN URO.   She has history of anemia secondary to menorrhagia. Likely due to fibroids for which she is already being followed by GYN.   HTN Blood pressure is slightly elevated today. She did eat prior to her office visit and dietary intake was not optimal today. She is taking carvedilol 6.25 mg BID and losartan 100 mg daily as prescribed.  BP Readings from Last 3 Encounters:  04/25/21 (!) 137/93  12/18/20 112/64  08/09/20 (!) 147/101        Review of Systems  Constitutional:  Negative for fever, malaise/fatigue and weight loss.  HENT: Negative.  Negative for nosebleeds.   Eyes: Negative.  Negative for blurred vision, double vision and  photophobia.  Respiratory: Negative.  Negative for cough and shortness of breath.   Cardiovascular: Negative.  Negative for chest pain, palpitations and leg swelling.  Gastrointestinal: Negative.  Negative for heartburn, nausea and vomiting.  Genitourinary:        Urinary incontinence  Musculoskeletal: Negative.  Negative for myalgias.  Neurological: Negative.  Negative for dizziness, focal weakness, seizures and headaches.  Psychiatric/Behavioral: Negative.  Negative for suicidal ideas.    Past Medical History:  Diagnosis Date   Anemia    Gout    Hyperlipidemia    Hypertension     Past Surgical History:  Procedure Laterality Date   CESAREAN SECTION     IR RADIOLOGIST EVAL & MGMT  07/12/2020   TUBAL LIGATION      Family History  Problem Relation Age of Onset   Diabetes Father    Hypertension Father    Heart disease Father        CHF   Gout Father    Hypertension Mother    Breast cancer Maternal Aunt    Cancer Maternal Aunt    Breast cancer Cousin    Cancer Cousin        Maternal Cousin died from breast cancer    Social History Reviewed with no changes to be made today.   Outpatient Medications Prior to Visit  Medication Sig Dispense Refill   allopurinol (ZYLOPRIM) 100 MG tablet Take 2 tablets (200 mg total) by mouth daily. 180 tablet 2   carvedilol (COREG)  6.25 MG tablet TAKE 1 TABLET BY MOUTH 2 TIMES DAILY WITH A MEAL. 180 tablet 0   Ferrous Sulfate (IRON PO) Take by mouth. (Patient not taking: Reported on 04/25/2021)     gemfibrozil (LOPID) 600 MG tablet Take 1 tablet (600 mg total) by mouth 2 (two) times daily before a meal. 180 tablet 3   ibuprofen (ADVIL) 800 MG tablet Take 1 tablet (800 mg total) by mouth every 8 (eight) hours as needed. 30 tablet 0   norethindrone (AYGESTIN) 5 MG tablet TAKE 3 TABLETS (15 MG TOTAL) BY MOUTH DAILY. 270 tablet 3   oxybutynin (DITROPAN-XL) 10 MG 24 hr tablet TAKE 1 TABLET BY MOUTH EVERY DAY 90 tablet 1   triamcinolone (KENALOG)  0.025 % ointment Apply 1 application topically 2 (two) times daily. (Patient not taking: Reported on 04/25/2021) 30 g 0   losartan (COZAAR) 100 MG tablet Take 1 tablet (100 mg total) by mouth daily. 90 tablet 0   oxybutynin (DITROPAN-XL) 5 MG 24 hr tablet Take 1 tablet (5 mg total) by mouth daily. (Patient not taking: Reported on 04/25/2021) 30 tablet 5   No facility-administered medications prior to visit.    Allergies  Allergen Reactions   Naproxen Shortness Of Breath   Amlodipine Swelling    BLE EDEMA   Hctz [Hydrochlorothiazide] Other (See Comments)    GOUT       Objective:    BP (!) 137/93 (BP Location: Left Arm, Patient Position: Sitting, Cuff Size: Large)    Pulse 100    Resp 16    Wt 191 lb 9.6 oz (86.9 kg)    LMP 04/16/2021 (Approximate)    SpO2 100%    BMI 31.88 kg/m  Wt Readings from Last 3 Encounters:  04/25/21 191 lb 9.6 oz (86.9 kg)  12/18/20 188 lb (85.3 kg)  08/09/20 185 lb (83.9 kg)    Physical Exam Vitals and nursing note reviewed.  Constitutional:      Appearance: She is well-developed.  HENT:     Head: Normocephalic and atraumatic.  Cardiovascular:     Rate and Rhythm: Normal rate and regular rhythm.     Heart sounds: Normal heart sounds. No murmur heard.   No friction rub. No gallop.  Pulmonary:     Effort: Pulmonary effort is normal. No tachypnea or respiratory distress.     Breath sounds: Normal breath sounds. No decreased breath sounds, wheezing, rhonchi or rales.  Chest:     Chest wall: No tenderness.  Abdominal:     General: Bowel sounds are normal.     Palpations: Abdomen is soft.  Musculoskeletal:        General: Normal range of motion.     Cervical back: Normal range of motion.  Skin:    General: Skin is warm and dry.  Neurological:     Mental Status: She is alert and oriented to person, place, and time.     Coordination: Coordination normal.  Psychiatric:        Behavior: Behavior normal. Behavior is cooperative.        Thought  Content: Thought content normal.        Judgment: Judgment normal.         Patient has been counseled extensively about nutrition and exercise as well as the importance of adherence with medications and regular follow-up. The patient was given clear instructions to go to ER or return to medical center if symptoms don't improve, worsen or new problems develop. The patient verbalized  understanding.   Follow-up: Return in about 3 months (around 07/24/2021).   [name], FNP-BC [facility] [place], [place]   04/25/2021, 2:43 PM

## 2021-04-26 LAB — CMP14+EGFR
ALT: 9 IU/L (ref 0–32)
AST: 14 IU/L (ref 0–40)
Albumin/Globulin Ratio: 1.3 (ref 1.2–2.2)
Albumin: 3.9 g/dL (ref 3.8–4.8)
Alkaline Phosphatase: 52 IU/L (ref 44–121)
BUN/Creatinine Ratio: 11 (ref 9–23)
BUN: 9 mg/dL (ref 6–24)
Bilirubin Total: 0.4 mg/dL (ref 0.0–1.2)
CO2: 20 mmol/L (ref 20–29)
Calcium: 9.4 mg/dL (ref 8.7–10.2)
Chloride: 105 mmol/L (ref 96–106)
Creatinine, Ser: 0.81 mg/dL (ref 0.57–1.00)
Globulin, Total: 3.1 g/dL (ref 1.5–4.5)
Glucose: 88 mg/dL (ref 70–99)
Potassium: 4.6 mmol/L (ref 3.5–5.2)
Sodium: 142 mmol/L (ref 134–144)
Total Protein: 7 g/dL (ref 6.0–8.5)
eGFR: 91 mL/min/{1.73_m2} (ref >59)

## 2021-04-26 LAB — CBC WITH DIFFERENTIAL/PLATELET
Basophils Absolute: 0.1 10*3/uL (ref 0.0–0.2)
Basos: 1 %
EOS (ABSOLUTE): 0.4 10*3/uL (ref 0.0–0.4)
Eos: 5 %
Hematocrit: 35.6 % (ref 34.0–46.6)
Hemoglobin: 11.4 g/dL (ref 11.1–15.9)
Immature Grans (Abs): 0 10*3/uL (ref 0.0–0.1)
Immature Granulocytes: 0 %
Lymphocytes Absolute: 1.5 10*3/uL (ref 0.7–3.1)
Lymphs: 20 %
MCH: 25.9 pg — ABNORMAL LOW (ref 26.6–33.0)
MCHC: 32 g/dL (ref 31.5–35.7)
MCV: 81 fL (ref 79–97)
Monocytes Absolute: 0.6 10*3/uL (ref 0.1–0.9)
Monocytes: 8 %
Neutrophils Absolute: 5 10*3/uL (ref 1.4–7.0)
Neutrophils: 66 %
Platelets: 348 10*3/uL (ref 150–450)
RBC: 4.41 x10E6/uL (ref 3.77–5.28)
RDW: 18.8 % — ABNORMAL HIGH (ref 11.7–15.4)
WBC: 7.5 10*3/uL (ref 3.4–10.8)

## 2021-06-08 ENCOUNTER — Encounter: Payer: Self-pay | Admitting: Nurse Practitioner

## 2021-06-24 ENCOUNTER — Encounter: Payer: Self-pay | Admitting: Nurse Practitioner

## 2021-06-25 ENCOUNTER — Other Ambulatory Visit: Payer: Self-pay | Admitting: Nurse Practitioner

## 2021-06-25 DIAGNOSIS — D500 Iron deficiency anemia secondary to blood loss (chronic): Secondary | ICD-10-CM

## 2021-06-25 DIAGNOSIS — N920 Excessive and frequent menstruation with regular cycle: Secondary | ICD-10-CM

## 2021-07-10 ENCOUNTER — Other Ambulatory Visit: Payer: Self-pay | Admitting: Nurse Practitioner

## 2021-07-28 ENCOUNTER — Other Ambulatory Visit: Payer: Self-pay | Admitting: Nurse Practitioner

## 2021-07-28 ENCOUNTER — Other Ambulatory Visit: Payer: Self-pay | Admitting: Family Medicine

## 2021-07-28 DIAGNOSIS — I10 Essential (primary) hypertension: Secondary | ICD-10-CM

## 2021-07-30 NOTE — Telephone Encounter (Signed)
Pharmacy has refill. ?Requested Prescriptions  ?Refused Prescriptions Disp Refills  ?? losartan (COZAAR) 100 MG tablet [Pharmacy Med Name: LOSARTAN POTASSIUM 100 MG TAB] 90 tablet 1  ?  Sig: TAKE 1 TABLET BY MOUTH EVERY DAY  ?  ? Cardiovascular:  Angiotensin Receptor Blockers Failed - 07/28/2021 12:33 PM  ?  ?  Failed - Last BP in normal range  ?  BP Readings from Last 1 Encounters:  ?04/25/21 (!) 137/93  ?   ?  ?  Passed - Cr in normal range and within 180 days  ?  Creatinine, Ser  ?Date Value Ref Range Status  ?04/25/2021 0.81 0.57 - 1.00 mg/dL Final  ?   ?  ?  Passed - K in normal range and within 180 days  ?  Potassium  ?Date Value Ref Range Status  ?04/25/2021 4.6 3.5 - 5.2 mmol/L Final  ?   ?  ?  Passed - Patient is not pregnant  ?  ?  Passed - Valid encounter within last 6 months  ?  Recent Outpatient Visits   ?      ? 3 months ago Essential hypertension  ? [place], [place] W, NP  ? 9 months ago Essential hypertension  ? [place], Vernia Buff, NP  ? 1 year ago Essential hypertension  ? [place], Vernia Buff, NP  ? 1 year ago Essential hypertension  ? [place], RPH-CPP  ? 1 year ago Encounter for Papanicolaou smear for cervical cancer screening  ? [place], Vernia Buff, NP  ?  ?  ? ?  ?  ?  ? ?

## 2021-08-02 ENCOUNTER — Encounter: Payer: Self-pay | Admitting: Nurse Practitioner

## 2021-08-03 ENCOUNTER — Other Ambulatory Visit: Payer: Self-pay | Admitting: Nurse Practitioner

## 2021-08-08 ENCOUNTER — Encounter: Payer: Self-pay | Admitting: Nurse Practitioner

## 2021-08-08 ENCOUNTER — Other Ambulatory Visit (HOSPITAL_COMMUNITY)
Admission: RE | Admit: 2021-08-08 | Discharge: 2021-08-08 | Disposition: A | Discharge status: Home / Self Care | Payer: Medicaid Other | Source: Ambulatory Visit | Attending: Nurse Practitioner | Admitting: Nurse Practitioner

## 2021-08-08 ENCOUNTER — Ambulatory Visit: Payer: PRIVATE HEALTH INSURANCE | Attending: Nurse Practitioner

## 2021-08-08 ENCOUNTER — Other Ambulatory Visit: Payer: Self-pay | Admitting: Nurse Practitioner

## 2021-08-08 DIAGNOSIS — N760 Acute vaginitis: Secondary | ICD-10-CM | POA: Diagnosis not present

## 2021-08-08 DIAGNOSIS — R399 Unspecified symptoms and signs involving the genitourinary system: Secondary | ICD-10-CM

## 2021-08-09 LAB — MICROSCOPIC EXAMINATION
Bacteria, UA: NONE SEEN
Casts: NONE SEEN /lpf
Epithelial Cells (non renal): >10 /hpf — AB (ref 0–10)
RBC, Urine: NONE SEEN /hpf (ref 0–2)

## 2021-08-09 LAB — CERVICOVAGINAL ANCILLARY ONLY
Bacterial Vaginitis (gardnerella): NEGATIVE
Candida Glabrata: NEGATIVE
Candida Vaginitis: NEGATIVE
Chlamydia: NEGATIVE
Comment: NEGATIVE
Comment: NEGATIVE
Comment: NEGATIVE
Comment: NEGATIVE
Comment: NEGATIVE
Comment: NORMAL
Neisseria Gonorrhea: NEGATIVE
Trichomonas: NEGATIVE

## 2021-08-09 LAB — URINALYSIS, COMPLETE
Bilirubin, UA: NEGATIVE
Glucose, UA: NEGATIVE
Leukocytes,UA: NEGATIVE
Nitrite, UA: NEGATIVE
RBC, UA: NEGATIVE
Specific Gravity, UA: >=1.03 — AB (ref 1.005–1.030)
Urobilinogen, Ur: 1 mg/dL (ref 0.2–1.0)
pH, UA: 5.5 (ref 5.0–7.5)

## 2021-08-16 ENCOUNTER — Encounter: Payer: Self-pay | Admitting: Nurse Practitioner

## 2021-08-21 DIAGNOSIS — E7800 Pure hypercholesterolemia, unspecified: Secondary | ICD-10-CM | POA: Insufficient documentation

## 2021-08-21 DIAGNOSIS — I10 Essential (primary) hypertension: Secondary | ICD-10-CM | POA: Insufficient documentation

## 2021-08-21 DIAGNOSIS — D259 Leiomyoma of uterus, unspecified: Secondary | ICD-10-CM | POA: Insufficient documentation

## 2021-08-29 ENCOUNTER — Other Ambulatory Visit: Payer: Self-pay | Admitting: Pharmacist

## 2021-08-29 MED ORDER — NORETHINDRONE ACETATE 5 MG PO TABS: 15.0000 mg | ORAL_TABLET | Freq: Every day | ORAL | 0 refills | Status: DC

## 2021-09-07 ENCOUNTER — Encounter: Payer: Self-pay | Admitting: Nurse Practitioner

## 2021-09-09 ENCOUNTER — Other Ambulatory Visit: Payer: Self-pay | Admitting: Nurse Practitioner

## 2021-09-09 DIAGNOSIS — E783 Hyperchylomicronemia: Secondary | ICD-10-CM

## 2021-09-10 ENCOUNTER — Other Ambulatory Visit: Payer: Self-pay | Admitting: Nurse Practitioner

## 2021-09-10 MED ORDER — FLUTICASONE PROPIONATE 50 MCG/ACT NA SUSP: 2.0000 | Freq: Every day | NASAL | 0 refills | Status: DC

## 2021-09-10 MED ORDER — LORATADINE 10 MG PO TABS: 10.0000 mg | ORAL_TABLET | Freq: Every day | ORAL | 0 refills | Status: DC

## 2021-09-10 MED ORDER — GEMFIBROZIL 600 MG PO TABS: 600.0000 mg | ORAL_TABLET | Freq: Two times a day (BID) | ORAL | 3 refills | Status: DC

## 2021-09-11 ENCOUNTER — Other Ambulatory Visit: Payer: Self-pay | Admitting: Nurse Practitioner

## 2021-09-11 DIAGNOSIS — E783 Hyperchylomicronemia: Secondary | ICD-10-CM

## 2021-09-11 MED ORDER — GEMFIBROZIL 600 MG PO TABS
600.0000 mg | ORAL_TABLET | Freq: Two times a day (BID) | ORAL | 3 refills | Status: DC
  Filled 2021-09-11: qty 180, 90d supply, fill #0

## 2021-09-12 ENCOUNTER — Other Ambulatory Visit: Payer: Self-pay

## 2021-09-19 ENCOUNTER — Other Ambulatory Visit: Payer: Self-pay

## 2021-09-26 ENCOUNTER — Other Ambulatory Visit: Payer: Self-pay | Admitting: Family Medicine

## 2021-10-03 ENCOUNTER — Other Ambulatory Visit: Payer: Self-pay | Admitting: Nurse Practitioner

## 2021-10-20 ENCOUNTER — Other Ambulatory Visit: Payer: Self-pay | Admitting: Nurse Practitioner

## 2021-10-20 DIAGNOSIS — I10 Essential (primary) hypertension: Secondary | ICD-10-CM

## 2021-10-22 NOTE — Telephone Encounter (Signed)
Requested Prescriptions  Pending Prescriptions Disp Refills  . losartan (COZAAR) 100 MG tablet [Pharmacy Med Name: LOSARTAN POTASSIUM 100 MG TAB] 90 tablet 1    Sig: TAKE 1 TABLET BY MOUTH EVERY DAY     Cardiovascular:  Angiotensin Receptor Blockers Failed - 10/20/2021 12:40 AM      Failed - Last BP in normal range    BP Readings from Last 1 Encounters:  04/25/21 (!) 137/93         Passed - Cr in normal range and within 180 days    Creatinine, Ser  Date Value Ref Range Status  04/25/2021 0.81 0.57 - 1.00 mg/dL Final         Passed - K in normal range and within 180 days    Potassium  Date Value Ref Range Status  04/25/2021 4.6 3.5 - 5.2 mmol/L Final         Passed - Patient is not pregnant      Passed - Valid encounter within last 6 months    Recent Outpatient Visits          6 months ago Essential hypertension   [place], Vernia Buff, NP   12 months ago Essential hypertension   [place], Vernia Buff, NP   1 year ago Essential hypertension   [place], Vernia Buff, NP   1 year ago Essential hypertension   [place], RPH-CPP   1 year ago Encounter for Papanicolaou smear for cervical cancer screening   [place], Zelda W, NP             Courtesy refill. Please make a follow up appointment for further refills.

## 2021-11-01 ENCOUNTER — Telehealth: Payer: Self-pay | Admitting: Nurse Practitioner

## 2021-11-01 ENCOUNTER — Other Ambulatory Visit: Payer: Self-pay | Admitting: Nurse Practitioner

## 2021-11-01 NOTE — Telephone Encounter (Signed)
Copied from [place]. Topic: Appointment Scheduling - Scheduling Inquiry for Clinic >> Nov 01, 2021  8:58 AM Rebecca Joseph wrote: Reason for CRM: The patient would like to be seen for a blood pressure check as well as lab work prior to 11/28/21 when they'll be having surgery   The patient would prefer to be seen on a Tuesday or Wednesday in the morning sometime in August   The patient is also requesting orders for lab work specifically their hemoglobin to be checked   Please contact the patient to successfully schedule their appt   Please contact further when possible

## 2021-11-12 ENCOUNTER — Other Ambulatory Visit: Payer: Self-pay | Admitting: Pharmacist

## 2021-11-12 NOTE — Chronic Care Management (AMB) (Signed)
Patient appearing on report for [facility] - Hypertension Control report due to last documented ambulatory blood pressure of 137/93 on 04/25/21. Next appointment with PCP is tomorrow   Outreached patient to discuss hypertension control and medication management.   Current antihypertensives: losartan 100 mg daily, carvedilol 6.25 mg twice daily - though patient realized she was often forgetting the evening dose so has moved all her medications to morning- taking 2 carvedilol QAM  Prior medications: amlodipine (lower extremity edema, even on 5 mg); HCTZ (gout flare, even on allopurinol).   Patient has an automated upper arm home BP machine.  Current blood pressure readings: 134/79 when she last checked  Patient is taking ibuprofen for fibroid cramps. Upcoming hysterectomy later this month.    Assessment/Plan: - Currently uncontrolled - - Reviewed goal blood pressure <130/80 - Reviewed appropriate administration of medication regimen - Discussed need to separate carvedilol due to duration of action. She will try setting an alarm on her phone to remind her to take her medications twice daily.  - Consider switching losartan to valsartan for greater potency.   Catie Hedwig Morton, PharmD, [facility] [phone]

## 2021-11-12 NOTE — Patient Instructions (Addendum)
Katharin,   It was great talking to you today! The carvedilol needs to be twice a day, as it only lasts ~12 hours. Try setting an alarm on your phone to help remind you to take the evening doses of that and the gemfibrozil.   Talk to Zelda about checking a uric acid and probably starting your allopurinol back at a lower dose. I would not start back at 200 mg if you have been off it for a while.   I'll suggest to Zelda to change the losartan to a more potent "cousin" medication, valsartan.   Check your blood pressure at least once weekly, and any time you have concerning symptoms like headache, chest pain, dizziness, shortness of breath, or vision changes.   Our goal is less than 130/80.  To appropriately check your blood pressure, make sure you do the following:  1) Avoid caffeine, exercise, or tobacco products for 30 minutes before checking. Empty your bladder. 2) Sit with your back supported in a flat-backed chair. Rest your arm on something flat (arm of the chair, table, etc). 3) Sit still with your feet flat on the floor, resting, for at least 5 minutes.  4) Check your blood pressure. Take 1-2 readings.  5) Write down these readings and bring with you to any provider appointments.  Bring your home blood pressure machine with you to a provider's office for accuracy comparison at least once a year.   Make sure you take your blood pressure medications before you come to any office visit, even if you were asked to fast for labs.  Take care!  Catie Hedwig Morton, PharmD, [facility] [phone]

## 2021-11-13 ENCOUNTER — Telehealth (HOSPITAL_BASED_OUTPATIENT_CLINIC_OR_DEPARTMENT_OTHER): Payer: PRIVATE HEALTH INSURANCE | Admitting: Nurse Practitioner

## 2021-11-13 ENCOUNTER — Encounter: Payer: Self-pay | Admitting: Nurse Practitioner

## 2021-11-13 DIAGNOSIS — M1A4710 Other secondary chronic gout, right ankle and foot, without tophus (tophi): Secondary | ICD-10-CM

## 2021-11-13 DIAGNOSIS — F32A Depression, unspecified: Secondary | ICD-10-CM

## 2021-11-13 DIAGNOSIS — J302 Other seasonal allergic rhinitis: Secondary | ICD-10-CM | POA: Diagnosis not present

## 2021-11-13 DIAGNOSIS — F419 Anxiety disorder, unspecified: Secondary | ICD-10-CM | POA: Diagnosis not present

## 2021-11-13 DIAGNOSIS — I10 Essential (primary) hypertension: Secondary | ICD-10-CM

## 2021-11-13 MED ORDER — CARVEDILOL 6.25 MG PO TABS: 6.2500 mg | ORAL_TABLET | Freq: Two times a day (BID) | ORAL | 1 refills | Status: DC

## 2021-11-13 MED ORDER — LORATADINE 10 MG PO TABS: 10.0000 mg | ORAL_TABLET | Freq: Every day | ORAL | 0 refills | Status: DC

## 2021-11-13 MED ORDER — HYDROXYZINE HCL 25 MG PO TABS: 12.5000 mg | ORAL_TABLET | Freq: Three times a day (TID) | ORAL | 3 refills | Status: DC | PRN

## 2021-11-13 MED ORDER — LOSARTAN POTASSIUM 100 MG PO TABS: 100.0000 mg | ORAL_TABLET | Freq: Every day | ORAL | 3 refills | Status: DC

## 2021-11-13 NOTE — Progress Notes (Signed)
Virtual Visit Note  I discussed the limitations, risks, security and privacy concerns of performing an evaluation and management service by video and the availability of in person appointments. I also discussed with the patient that there may be a patient responsible charge related to this service. The patient expressed understanding and agreed to proceed.    I connected with Rebecca Joseph on 11/13/21  at  10:30 AM EDT  EDT by VIDEO and verified that I am speaking with the correct person using two identifiers.   Location of Patient: Private Residence   Location of Provider: [place] [facility] [place] participating in VIRTUAL visit: [name] FNP-BC Rebecca Joseph    History of Present Illness: VIRTUAL visit for: HTN   She has a past medical history of Iron def. Anemia due to menorrhagia, Gout, Hyperlipidemia, and Hypertension.   [name] has upcoming surgery (11-28-2021 [facility]) and has questions regarding her BP which over the past 2 days is currently averaging: 136/91. She just started taking carvedilol 6.'25mg'$  BID as prescribed yesterday. Will have her continue to take all of her medications as prescribed and return for BP check prior to surgery in a few weeks. She is also taking losartan 100 mg BID.   GOUT Reports not taking allopurinol for several months. Will recheck uric acid levels and decide if she needs to continue. She feels her diet is well balanced and denies any recent gout flares.     Past Medical History:  Diagnosis Date   Anemia    Gout    Hyperlipidemia    Hypertension     Past Surgical History:  Procedure Laterality Date   CESAREAN SECTION     IR RADIOLOGIST EVAL & MGMT  07/12/2020   TUBAL LIGATION      Family History  Problem Relation Age of Onset   Diabetes Father    Hypertension Father    Heart disease Father        CHF   Gout Father    Hypertension Mother    Breast cancer Maternal Aunt    Cancer  Maternal Aunt    Breast cancer Cousin    Cancer Cousin        Maternal Cousin died from breast cancer    Social History   Socioeconomic History   Marital status: Legally Separated    Spouse name: Not on file   Number of children: 3   Years of education: Not on file   Highest education level: High school graduate  Occupational History   Not on file  Tobacco Use   Smoking status: Former   Smokeless tobacco: Never  Scientific laboratory technician Use: Never used  Substance and Sexual Activity   Alcohol use: Yes    Comment: occasionally   Drug use: No   Sexual activity: Yes    Birth control/protection: Surgical  Other Topics Concern   Not on file  Social History Narrative   Not on file   Social Determinants of Health   Financial Resource Strain: Not on file  Food Insecurity: No Food Insecurity (05/11/2020)   Hunger Vital Sign    Worried About Running Out of Food in the Last Year: Never true    Ran Out of Food in the Last Year: Never true  Transportation Needs: No Transportation Needs (05/11/2020)   PRAPARE - Hydrologist (Medical): No    Lack of Transportation (Non-Medical): No  Physical Activity: Not  on file  Stress: Not on file  Social Connections: Not on file     Observations/Objective: Awake, alert and oriented x 3   Review of Systems  Constitutional:  Negative for fever, malaise/fatigue and weight loss.  HENT: Negative.  Negative for nosebleeds.   Eyes: Negative.  Negative for blurred vision, double vision and photophobia.  Respiratory: Negative.  Negative for cough and shortness of breath.   Cardiovascular: Negative.  Negative for chest pain, palpitations and leg swelling.  Gastrointestinal: Negative.  Negative for heartburn, nausea and vomiting.  Musculoskeletal: Negative.  Negative for myalgias.  Neurological: Negative.  Negative for dizziness, focal weakness, seizures and headaches.  Psychiatric/Behavioral: Negative.  Negative for suicidal  ideas.     Assessment and Plan: Diagnoses and all orders for this visit:  Essential hypertension Continue all antihypertensives as prescribed.  Reminded to bring in blood pressure log for follow  up appointment.  RECOMMENDATIONS: DASH/Mediterranean Diets are healthier choices for HTN.   -     carvedilol (COREG) 6.25 MG tablet; Take 1 tablet (6.25 mg total) by mouth 2 (two) times daily with a meal. -     losartan (COZAAR) 100 MG tablet; Take 1 tablet (100 mg total) by mouth daily.  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  Anxiety and depression -     hydrOXYzine (ATARAX) 25 MG tablet; Take 0.5-1 tablets (12.5-25 mg total) by mouth 3 (three) times daily as needed.     Follow Up Instructions Return in about 3 weeks (around 12/04/2021) for HTN.     I discussed the assessment and treatment plan with the patient. The patient was provided an opportunity to ask questions and all were answered. The patient agreed with the plan and demonstrated an understanding of the instructions.   The patient was advised to call back or seek an in-person evaluation if the symptoms worsen or if the condition fails to improve as anticipated.  I provided 12 minutes of face-to-face time during this encounter including median intraservice time, reviewing previous notes, labs, imaging, medications and explaining diagnosis and management.  [name], FNP-BC

## 2021-11-14 ENCOUNTER — Encounter (HOSPITAL_BASED_OUTPATIENT_CLINIC_OR_DEPARTMENT_OTHER): Payer: Self-pay | Admitting: Obstetrics and Gynecology

## 2021-11-19 ENCOUNTER — Encounter (HOSPITAL_BASED_OUTPATIENT_CLINIC_OR_DEPARTMENT_OTHER): Payer: Self-pay | Admitting: Obstetrics and Gynecology

## 2021-11-21 ENCOUNTER — Other Ambulatory Visit: Payer: Self-pay

## 2021-11-21 ENCOUNTER — Encounter (HOSPITAL_BASED_OUTPATIENT_CLINIC_OR_DEPARTMENT_OTHER): Payer: Self-pay | Admitting: Obstetrics and Gynecology

## 2021-11-21 NOTE — Progress Notes (Signed)
Spoke w/ via phone for pre-op interview---[facility] Lab needs dos---- urine pregnancy              Lab results------11/26/21 lab appt for cbc, bmp, type & screen, EKG COVID test -----patient states asymptomatic no test needed Arrive at -------0530 on Wednesday, 11/28/21 NPO after MN NO Solid Food.  Clear liquids from MN until---0430 Med rec completed Medications to take morning of surgery -----Carvedilol, Hydroxyzine prn, Claritin prn, norethindrone Diabetic medication -----n/a Patient instructed no nail polish to be worn day of surgery Patient instructed to bring photo id and insurance card day of surgery Patient aware to have Driver (ride ) / caregiver    for 24 hours after surgery - sister, Anne Ng Patient Special Instructions -----Extended / overnight stay instructions given. Pre-Op special Istructions -----none Patient verbalized understanding of instructions that were given at this phone interview. Patient denies shortness of breath, chest pain, fever, cough at this phone interview.

## 2021-11-21 NOTE — Progress Notes (Signed)
Your procedure is scheduled on Wednesday, 11/28/21.  Report to [name]   Call this number if you have problems the morning of surgery  :[phone].   OUR ADDRESS IS [place].  [facility] ARE LOCATED IN [facility].  PLEASE BRING YOUR INSURANCE CARD AND PHOTO ID DAY OF SURGERY.  ONLY 2 PEOPLE ARE ALLOWED IN  WAITING  ROOM.                                      REMEMBER:  DO NOT EAT FOOD, CANDY GUM OR MINTS  AFTER MIDNIGHT THE NIGHT BEFORE YOUR SURGERY . YOU MAY HAVE CLEAR LIQUIDS FROM MIDNIGHT THE NIGHT BEFORE YOUR SURGERY UNTIL  4:30 AM. NO CLEAR LIQUIDS AFTER  4:30 AM DAY OF SURGERY.  YOU MAY  BRUSH YOUR TEETH MORNING OF SURGERY AND RINSE YOUR MOUTH OUT, NO CHEWING GUM CANDY OR MINTS.     CLEAR LIQUID DIET   Foods Allowed                                                                     Foods Excluded  Coffee and tea, regular and decaf                             liquids that you cannot  Plain Jell-O                                                                   see through such as: Fruit ices (not with fruit pulp)                                     milk, soups, orange juice  Plain  Popsicles                                    All solid food Carbonated beverages, regular and diet                                    Cranberry, grape and apple juices Sports drinks like Gatorade _____________________________________________________________________     TAKE THESE MEDICATIONS MORNING OF SURGERY: Carvedilol, hydroxyzine if needed, Claritin if needed, norethindrone    UP TO 4 VISITORS  MAY VISIT IN THE EXTENDED RECOVERY ROOM UNTIL 800 PM ONLY.  ONE  VISITOR AGE 79 AND OVER MAY SPEND THE NIGHT AND MUST BE IN EXTENDED RECOVERY ROOM NO LATER THAN 800 PM . YOUR DISCHARGE TIME AFTER YOU SPEND THE NIGHT IS 900 AM THE MORNING AFTER YOUR SURGERY.  YOU MAY PACK A SMALL OVERNIGHT BAG WITH  TOILETRIES FOR YOUR OVERNIGHT STAY IF  YOU WISH.  YOUR PRESCRIPTION MEDICATIONS WILL BE PROVIDED DURING [place].                                      DO NOT WEAR JEWERLY, MAKE UP. DO NOT WEAR LOTIONS, POWDERS, PERFUMES OR NAIL POLISH ON YOUR FINGERNAILS. TOENAIL POLISH IS OK TO WEAR. DO NOT SHAVE FOR 48 HOURS PRIOR TO DAY OF SURGERY. MEN MAY SHAVE FACE AND NECK. CONTACTS, GLASSES, OR DENTURES MAY NOT BE WORN TO SURGERY.  REMEMBER: NO SMOKING, DRUGS OR ALCOHOL FOR 24 HOURS BEFORE YOUR SURGERY.                                    [facility] IS NOT RESPONSIBLE  FOR ANY BELONGINGS.                                                                    Marland Kitchen           [facility] - Preparing for Surgery Before surgery, you can play an important role.  Because skin is not sterile, your skin needs to be as free of germs as possible.  You can reduce the number of germs on your skin by washing with CHG (chlorahexidine gluconate) soap before surgery.  CHG is an antiseptic cleaner which kills germs and bonds with the skin to continue killing germs even after washing. Please DO NOT use if you have an allergy to CHG or antibacterial soaps.  If your skin becomes reddened/irritated stop using the CHG and inform your nurse when you arrive at Short Stay. Do not shave (including legs and underarms) for at least 48 hours prior to the first CHG shower.  You may shave your face/neck. Please follow these instructions carefully:  1.  Shower with CHG Soap the night before surgery and the  morning of Surgery.  2.  If you choose to wash your hair, wash your hair first as usual with your  normal  shampoo.  3.  After you shampoo, rinse your hair and body thoroughly to remove the  shampoo.                            4.  Use CHG as you would any other liquid soap.  You can apply chg directly  to the skin and wash , please wash your belly button thoroughly with chg soap provided night before and morning of your surgery.                     Gently with a  scrungie or clean washcloth.  5.  Apply the CHG Soap to your body ONLY FROM THE NECK DOWN.   Do not use on face/ open                           Wound or open sores. Avoid contact with eyes, ears mouth and genitals (private parts).  Wash face,  Genitals (private parts) with your normal soap.             6.  Wash thoroughly, paying special attention to the area where your surgery  will be performed.  7.  Thoroughly rinse your body with warm water from the neck down.  8.  DO NOT shower/wash with your normal soap after using and rinsing off  the CHG Soap.                9.  Pat yourself dry with a clean towel.            10.  Wear clean pajamas.            11.  Place clean sheets on your bed the night of your first shower and do not  sleep with pets. Day of Surgery : Do not apply any lotions/deodorants the morning of surgery.  Please wear clean clothes to the hospital/surgery center.  IF YOU HAVE ANY SKIN IRRITATION OR PROBLEMS WITH THE SURGICAL SOAP, PLEASE GET A BAR OF GOLD DIAL SOAP AND SHOWER THE NIGHT BEFORE YOUR SURGERY AND THE MORNING OF YOUR SURGERY. PLEASE LET THE NURSE KNOW MORNING OF YOUR SURGERY IF YOU HAD ANY PROBLEMS WITH THE SURGICAL SOAP.   ________________________________________________________________________                                                        QUESTIONS [place] [facility] PRE OP NURSE PHONE [phone].

## 2021-11-26 ENCOUNTER — Encounter (HOSPITAL_COMMUNITY)
Admission: RE | Admit: 2021-11-26 | Discharge: 2021-11-26 | Disposition: A | Discharge status: Home / Self Care | Payer: PRIVATE HEALTH INSURANCE | Source: Ambulatory Visit | Attending: Obstetrics and Gynecology | Admitting: Obstetrics and Gynecology

## 2021-11-26 DIAGNOSIS — D259 Leiomyoma of uterus, unspecified: Secondary | ICD-10-CM | POA: Diagnosis not present

## 2021-11-26 DIAGNOSIS — Z01818 Encounter for other preprocedural examination: Secondary | ICD-10-CM | POA: Diagnosis present

## 2021-11-26 LAB — BASIC METABOLIC PANEL
Anion gap: 6 (ref 5–15)
BUN: 15 mg/dL (ref 6–20)
CO2: 22 mmol/L (ref 22–32)
Calcium: 8.9 mg/dL (ref 8.9–10.3)
Chloride: 109 mmol/L (ref 98–111)
Creatinine, Ser: 0.83 mg/dL (ref 0.44–1.00)
GFR, Estimated: >60 mL/min (ref >60)
Glucose, Bld: 92 mg/dL (ref 70–99)
Potassium: 3.9 mmol/L (ref 3.5–5.1)
Sodium: 137 mmol/L (ref 135–145)

## 2021-11-26 LAB — CBC
HCT: 36.9 % (ref 36.0–46.0)
Hemoglobin: 11.5 g/dL — ABNORMAL LOW (ref 12.0–15.0)
MCH: 24.9 pg — ABNORMAL LOW (ref 26.0–34.0)
MCHC: 31.2 g/dL (ref 30.0–36.0)
MCV: 79.9 fL — ABNORMAL LOW (ref 80.0–100.0)
Platelets: 344 10*3/uL (ref 150–400)
RBC: 4.62 MIL/uL (ref 3.87–5.11)
RDW: 20.5 % — ABNORMAL HIGH (ref 11.5–15.5)
WBC: 8.1 10*3/uL (ref 4.0–10.5)
nRBC: 0 % (ref 0.0–0.2)

## 2021-11-27 ENCOUNTER — Ambulatory Visit: Payer: PRIVATE HEALTH INSURANCE | Attending: Nurse Practitioner

## 2021-11-27 NOTE — H&P (Signed)
Gynecology History and Physical Preoperative H&P for scheduled procedure 11/28/21  Rebecca Joseph is a 46 y.o. female [phone] presenting for scheduled total abdominal hysterectomy with bilateral salpingectomy for fibroid uterus.  She presented to our office with bulk symptoms secondary to large fibroid uterus. This includes pressure, pain, heaviness, dyspareunia, SUI.  She occasionally has heavy menses as well.  Pelvic US revealed 12 cm fibroid, enlarged slightly from prior study measuring 11 cm.    She has a history of two prior C sections and bilateral tubal ligation.    OB History     Gravida  4   Para  3   Term  3   Preterm      AB  1   Living  3      SAB  1   IAB      Ectopic      Multiple      Live Births             Past Medical History:  Diagnosis Date   Anemia 2018   iron deficiency, 2021 Hgb 6.3, most recent CBC as of 11/14/21,  Hgb 11.4 on 04/25/21 in Epic, iron infusions in 2021   Anxiety    Follows with [name], NP, [facility] 11/13/21.   COVID-19 11/24/2020   treated with anti-viral mediciation   GERD (gastroesophageal reflux disease)    occasional Tums or Rolaids   Gout    hx of gout   Headache    occasional   Hx of iron deficiency anemia    2018 & 2021, Hgb on 04/25/21 11.4   Hyperlipidemia    As of 11/14/21, pt is currently taking Lopid.   Hypertension    Pt follows with [name], NP @ [facility], LOV (video) 11/13/21.   Overactive bladder 2023   w/ urinary incontinence   Uterine leiomyoma 2023   w/ menorrhagia   Vitamin D deficiency 2022   hx of, no longer takes supplements as of 11/21/21   Past Surgical History:  Procedure Laterality Date   CESAREAN SECTION  2004   IR RADIOLOGIST EVAL & MGMT  07/12/2020   for fibroids, pt did not have procedure done   TUBAL LIGATION  2004   Family History: family history includes Breast cancer in her cousin and maternal aunt; Cancer in her cousin and maternal aunt;  Diabetes in her father; Gout in her father; Heart disease in her father; Hypertension in her father and mother. Social History:  reports that she quit smoking about 28 years ago. Her smoking use included cigarettes. She has a 0.26 pack-year smoking history. She has never used smokeless tobacco. She reports that she does not currently use alcohol. She reports that she does not use drugs.   Review of Systems - Patient denies fever, chills, SOB, CP, N/V/D.  History   Height '5\' 5"'$  (1.651 m), weight 85.7 kg, last menstrual period 11/17/2021. Exam Physical Exam   Gen: alert, well appearing, no distress Chest: nonlabored breathing, CTAB CV: no peripheral edema, RRR Abdomen: soft, nontender, 20week size uterus Ext: no evidence of DVT   Assessment/Plan: Admit for planned TAH BS Discussed benefits of ovarian retention.  Will consider removal if abnormal in appearance. Patient was counseled on the risks of procedure, which include but are not limited to bleeding, infection, damage to nearby organs including bowel/bladder/ureter, need for additional procedure or blood transfusion.  Patient agreeable to procedure, all questions answered.    Carlyon Shadow  11/27/2021, 4:46 AM

## 2021-11-27 NOTE — Anesthesia Preprocedure Evaluation (Addendum)
Anesthesia Evaluation  Patient identified by MRN, date of birth, ID band Patient awake    Reviewed: Allergy & Precautions, NPO status , Patient's Chart, lab work & pertinent test results, reviewed documented beta blocker date and time   History of Anesthesia Complications Negative for: history of anesthetic complications  Airway Mallampati: II  TM Distance: >3 FB Neck ROM: Full    Dental  (+) Dental Advisory Given, Teeth Intact   Pulmonary former smoker,    Pulmonary exam normal        Cardiovascular hypertension, Pt. on home beta blockers and Pt. on medications Normal cardiovascular exam     Neuro/Psych  Headaches, PSYCHIATRIC DISORDERS Anxiety    GI/Hepatic Neg liver ROS, GERD  Controlled,  Endo/Other   Obesity   Renal/GU negative Renal ROS  Female GU complaint     Musculoskeletal  (+) Arthritis ,  Gout    Abdominal   Peds  Hematology  (+) Blood dyscrasia, anemia ,   Anesthesia Other Findings   Reproductive/Obstetrics  Fibroids                             Anesthesia Physical Anesthesia Plan  ASA: 2  Anesthesia Plan: General   Post-op Pain Management: Tylenol PO (pre-op)*   Induction: Intravenous  PONV Risk Score and Plan: 3 and Treatment may vary due to age or medical condition, Ondansetron, Dexamethasone and Midazolam  Airway Management Planned: Oral ETT  Additional Equipment: None  Intra-op Plan:   Post-operative Plan: Extubation in OR  Informed Consent: I have reviewed the patients History and Physical, chart, labs and discussed the procedure including the risks, benefits and alternatives for the proposed anesthesia with the patient or authorized representative who has indicated his/her understanding and acceptance.     Dental advisory given  Plan Discussed with: CRNA and Anesthesiologist  Anesthesia Plan Comments:        Anesthesia Quick  Evaluation

## 2021-11-28 ENCOUNTER — Ambulatory Visit (HOSPITAL_BASED_OUTPATIENT_CLINIC_OR_DEPARTMENT_OTHER): Payer: Self-pay | Admitting: Anesthesiology

## 2021-11-28 ENCOUNTER — Other Ambulatory Visit: Payer: Self-pay

## 2021-11-28 ENCOUNTER — Encounter (HOSPITAL_BASED_OUTPATIENT_CLINIC_OR_DEPARTMENT_OTHER): Payer: Self-pay | Admitting: Obstetrics and Gynecology

## 2021-11-28 ENCOUNTER — Encounter (HOSPITAL_BASED_OUTPATIENT_CLINIC_OR_DEPARTMENT_OTHER)
Admission: RE | Disposition: A | Discharge status: Home / Self Care | Payer: Self-pay | Source: Home / Self Care | Attending: Obstetrics and Gynecology

## 2021-11-28 ENCOUNTER — Observation Stay (HOSPITAL_BASED_OUTPATIENT_CLINIC_OR_DEPARTMENT_OTHER)
Admission: RE | Admit: 2021-11-28 | Discharge: 2021-11-29 | Disposition: A | Discharge status: Home / Self Care | Payer: Self-pay | Attending: Obstetrics and Gynecology | Admitting: Obstetrics and Gynecology

## 2021-11-28 DIAGNOSIS — Z87891 Personal history of nicotine dependence: Secondary | ICD-10-CM | POA: Insufficient documentation

## 2021-11-28 DIAGNOSIS — Z98891 History of uterine scar from previous surgery: Secondary | ICD-10-CM | POA: Insufficient documentation

## 2021-11-28 DIAGNOSIS — D259 Leiomyoma of uterus, unspecified: Secondary | ICD-10-CM | POA: Diagnosis present

## 2021-11-28 DIAGNOSIS — I10 Essential (primary) hypertension: Secondary | ICD-10-CM | POA: Insufficient documentation

## 2021-11-28 DIAGNOSIS — Z8616 Personal history of COVID-19: Secondary | ICD-10-CM | POA: Insufficient documentation

## 2021-11-28 DIAGNOSIS — Z01818 Encounter for other preprocedural examination: Secondary | ICD-10-CM

## 2021-11-28 DIAGNOSIS — N888 Other specified noninflammatory disorders of cervix uteri: Secondary | ICD-10-CM | POA: Insufficient documentation

## 2021-11-28 DIAGNOSIS — N72 Inflammatory disease of cervix uteri: Secondary | ICD-10-CM | POA: Insufficient documentation

## 2021-11-28 DIAGNOSIS — D251 Intramural leiomyoma of uterus: Principal | ICD-10-CM | POA: Insufficient documentation

## 2021-11-28 HISTORY — DX: Gastro-esophageal reflux disease without esophagitis: K21.9

## 2021-11-28 HISTORY — PX: HYSTERECTOMY ABDOMINAL WITH SALPINGECTOMY: SHX6725

## 2021-11-28 HISTORY — DX: Personal history of diseases of the blood and blood-forming organs and certain disorders involving the immune mechanism: Z86.2

## 2021-11-28 HISTORY — DX: Headache, unspecified: R51.9

## 2021-11-28 HISTORY — DX: Anxiety disorder, unspecified: F41.9

## 2021-11-28 LAB — ABO/RH: ABO/RH(D): O POS

## 2021-11-28 LAB — POCT PREGNANCY, URINE: Preg Test, Ur: NEGATIVE

## 2021-11-28 LAB — TYPE AND SCREEN
ABO/RH(D): O POS
Antibody Screen: NEGATIVE

## 2021-11-28 SURGERY — HYSTERECTOMY, TOTAL, ABDOMINAL, WITH SALPINGECTOMY
Anesthesia: General | Site: Abdomen | Laterality: Bilateral

## 2021-11-28 MED ORDER — DEXTROSE-NACL 5-0.45 % IV SOLN: INTRAVENOUS | Status: DC

## 2021-11-28 MED ORDER — PROPOFOL 10 MG/ML IV BOLUS
INTRAVENOUS | Status: AC
  Filled 2021-11-28: qty 20

## 2021-11-28 MED ORDER — ONDANSETRON HCL 4 MG/2ML IJ SOLN
INTRAMUSCULAR | Status: AC
  Filled 2021-11-28: qty 2

## 2021-11-28 MED ORDER — MIDAZOLAM HCL 5 MG/5ML IJ SOLN
INTRAMUSCULAR | Status: DC | PRN
  Administered 2021-11-28: 2 mg via INTRAVENOUS

## 2021-11-28 MED ORDER — ARTIFICIAL TEARS OPHTHALMIC OINT
TOPICAL_OINTMENT | OPHTHALMIC | Status: AC
  Filled 2021-11-28: qty 3.5

## 2021-11-28 MED ORDER — VASOPRESSIN 20 UNIT/ML IV SOLN
INTRAVENOUS | Status: AC
  Filled 2021-11-28: qty 1

## 2021-11-28 MED ORDER — IBUPROFEN 200 MG PO TABS
600.0000 mg | ORAL_TABLET | Freq: Four times a day (QID) | ORAL | Status: DC | PRN
Start: 2021-11-28 — End: 2021-11-29
  Administered 2021-11-28 – 2021-11-29 (×2): 600 mg via ORAL

## 2021-11-28 MED ORDER — OXYCODONE HCL 5 MG PO TABS
5.0000 mg | ORAL_TABLET | Freq: Once | ORAL | Status: AC | PRN
  Administered 2021-11-28: 5 mg via ORAL

## 2021-11-28 MED ORDER — BUPIVACAINE HCL (PF) 0.25 % IJ SOLN
INTRAMUSCULAR | Status: AC
  Filled 2021-11-28: qty 30

## 2021-11-28 MED ORDER — SODIUM CHLORIDE 0.9 % IV SOLN
2.0000 g | Freq: Once | INTRAVENOUS | Status: AC
  Administered 2021-11-28: 2 g via INTRAVENOUS

## 2021-11-28 MED ORDER — FENTANYL CITRATE (PF) 100 MCG/2ML IJ SOLN
25.0000 ug | INTRAMUSCULAR | Status: DC | PRN
  Administered 2021-11-28 (×2): 25 ug via INTRAVENOUS
  Administered 2021-11-28 (×2): 50 ug via INTRAVENOUS

## 2021-11-28 MED ORDER — ACETAMINOPHEN 325 MG PO TABS
ORAL_TABLET | ORAL | Status: AC
  Filled 2021-11-28: qty 2

## 2021-11-28 MED ORDER — OXYCODONE HCL 5 MG PO TABS
ORAL_TABLET | ORAL | Status: AC
  Filled 2021-11-28: qty 2

## 2021-11-28 MED ORDER — ROCURONIUM BROMIDE 10 MG/ML (PF) SYRINGE
PREFILLED_SYRINGE | INTRAVENOUS | Status: DC | PRN
  Administered 2021-11-28: 10 mg via INTRAVENOUS
  Administered 2021-11-28: 50 mg via INTRAVENOUS
  Administered 2021-11-28: 20 mg via INTRAVENOUS

## 2021-11-28 MED ORDER — FENTANYL CITRATE (PF) 100 MCG/2ML IJ SOLN
INTRAMUSCULAR | Status: DC | PRN
  Administered 2021-11-28 (×2): 50 ug via INTRAVENOUS
  Administered 2021-11-28 (×2): 100 ug via INTRAVENOUS
  Administered 2021-11-28: 50 ug via INTRAVENOUS

## 2021-11-28 MED ORDER — FENTANYL CITRATE (PF) 100 MCG/2ML IJ SOLN
INTRAMUSCULAR | Status: AC
  Filled 2021-11-28: qty 2

## 2021-11-28 MED ORDER — LIDOCAINE HCL (PF) 2 % IJ SOLN
INTRAMUSCULAR | Status: AC
  Filled 2021-11-28: qty 5

## 2021-11-28 MED ORDER — HYDROXYZINE HCL 25 MG PO TABS
12.5000 mg | ORAL_TABLET | Freq: Three times a day (TID) | ORAL | Status: DC | PRN
  Administered 2021-11-28: 25 mg via ORAL
  Filled 2021-11-28: qty 2

## 2021-11-28 MED ORDER — DOCUSATE SODIUM 100 MG PO CAPS
ORAL_CAPSULE | ORAL | Status: AC
  Filled 2021-11-28: qty 1

## 2021-11-28 MED ORDER — SILVER NITRATE-POT NITRATE 75-25 % EX MISC
CUTANEOUS | Status: AC
  Filled 2021-11-28: qty 10

## 2021-11-28 MED ORDER — DEXMEDETOMIDINE (PRECEDEX) IN NS 20 MCG/5ML (4 MCG/ML) IV SYRINGE
PREFILLED_SYRINGE | INTRAVENOUS | Status: DC | PRN
  Administered 2021-11-28: 12 ug via INTRAVENOUS

## 2021-11-28 MED ORDER — LACTATED RINGERS IV SOLN: INTRAVENOUS | Status: DC

## 2021-11-28 MED ORDER — MENTHOL 3 MG MT LOZG: 1.0000 | LOZENGE | OROMUCOSAL | Status: DC | PRN

## 2021-11-28 MED ORDER — SIMETHICONE 80 MG PO CHEW
80.0000 mg | CHEWABLE_TABLET | Freq: Four times a day (QID) | ORAL | Status: DC | PRN
  Administered 2021-11-28: 80 mg via ORAL

## 2021-11-28 MED ORDER — FERROUS SULFATE 325 (65 FE) MG PO TABS
325.0000 mg | ORAL_TABLET | ORAL | Status: DC
  Filled 2021-11-28: qty 1

## 2021-11-28 MED ORDER — OXYCODONE HCL 5 MG PO TABS
5.0000 mg | ORAL_TABLET | ORAL | Status: DC | PRN
  Administered 2021-11-28: 5 mg via ORAL
  Administered 2021-11-28: 10 mg via ORAL
  Administered 2021-11-28: 5 mg via ORAL
  Administered 2021-11-29 (×2): 10 mg via ORAL

## 2021-11-28 MED ORDER — PROPOFOL 10 MG/ML IV BOLUS
INTRAVENOUS | Status: DC | PRN
  Administered 2021-11-28: 200 mg via INTRAVENOUS

## 2021-11-28 MED ORDER — BUPIVACAINE LIPOSOME 1.3 % IJ SUSP
INTRAMUSCULAR | Status: AC
  Filled 2021-11-28: qty 20

## 2021-11-28 MED ORDER — ALUM & MAG HYDROXIDE-SIMETH 200-200-20 MG/5ML PO SUSP: 30.0000 mL | ORAL | Status: DC | PRN

## 2021-11-28 MED ORDER — LIDOCAINE 2% (20 MG/ML) 5 ML SYRINGE
INTRAMUSCULAR | Status: DC | PRN
  Administered 2021-11-28: 6 mg via INTRAVENOUS

## 2021-11-28 MED ORDER — GUAIFENESIN 100 MG/5ML PO LIQD
15.0000 mL | ORAL | Status: DC | PRN
  Filled 2021-11-28: qty 15

## 2021-11-28 MED ORDER — DOCUSATE SODIUM 100 MG PO CAPS
100.0000 mg | ORAL_CAPSULE | Freq: Two times a day (BID) | ORAL | Status: DC
  Administered 2021-11-28 (×2): 100 mg via ORAL

## 2021-11-28 MED ORDER — SIMETHICONE 80 MG PO CHEW
CHEWABLE_TABLET | ORAL | Status: AC
  Filled 2021-11-28: qty 1

## 2021-11-28 MED ORDER — ACETAMINOPHEN 500 MG PO TABS
1000.0000 mg | ORAL_TABLET | Freq: Once | ORAL | Status: AC
  Administered 2021-11-28: 1000 mg via ORAL

## 2021-11-28 MED ORDER — ONDANSETRON HCL 4 MG/2ML IJ SOLN
INTRAMUSCULAR | Status: DC | PRN
  Administered 2021-11-28: 4 mg via INTRAVENOUS

## 2021-11-28 MED ORDER — SODIUM CHLORIDE (PF) 0.9 % IJ SOLN
INTRAMUSCULAR | Status: DC | PRN
  Administered 2021-11-28: 20 mL via INTRAVENOUS

## 2021-11-28 MED ORDER — GEMFIBROZIL 600 MG PO TABS
600.0000 mg | ORAL_TABLET | Freq: Two times a day (BID) | ORAL | Status: DC
  Administered 2021-11-28 – 2021-11-29 (×2): 600 mg via ORAL
  Filled 2021-11-28 (×2): qty 1

## 2021-11-28 MED ORDER — METHYLENE BLUE 1 % INJ SOLN
INTRAVENOUS | Status: AC
  Filled 2021-11-28: qty 10

## 2021-11-28 MED ORDER — IBUPROFEN 200 MG PO TABS
ORAL_TABLET | ORAL | Status: AC
  Filled 2021-11-28: qty 3

## 2021-11-28 MED ORDER — OXYCODONE HCL 5 MG/5ML PO SOLN: 5.0000 mg | Freq: Once | ORAL | Status: AC | PRN

## 2021-11-28 MED ORDER — PHENYLEPHRINE 80 MCG/ML (10ML) SYRINGE FOR IV PUSH (FOR BLOOD PRESSURE SUPPORT)
PREFILLED_SYRINGE | INTRAVENOUS | Status: AC
  Filled 2021-11-28: qty 10

## 2021-11-28 MED ORDER — DEXMEDETOMIDINE HCL IN NACL 80 MCG/20ML IV SOLN
INTRAVENOUS | Status: AC
  Filled 2021-11-28: qty 20

## 2021-11-28 MED ORDER — 0.9 % SODIUM CHLORIDE (POUR BTL) OPTIME
TOPICAL | Status: DC | PRN
  Administered 2021-11-28: 2000 mL

## 2021-11-28 MED ORDER — CARVEDILOL 6.25 MG PO TABS
6.2500 mg | ORAL_TABLET | Freq: Two times a day (BID) | ORAL | Status: DC
  Administered 2021-11-28 – 2021-11-29 (×2): 6.25 mg via ORAL
  Filled 2021-11-28 (×2): qty 1

## 2021-11-28 MED ORDER — MIDAZOLAM HCL 2 MG/2ML IJ SOLN
INTRAMUSCULAR | Status: AC
  Filled 2021-11-28: qty 2

## 2021-11-28 MED ORDER — PRENATAL MULTIVITAMIN CH
1.0000 | ORAL_TABLET | Freq: Every day | ORAL | Status: DC
  Filled 2021-11-28: qty 1

## 2021-11-28 MED ORDER — SUGAMMADEX SODIUM 200 MG/2ML IV SOLN
INTRAVENOUS | Status: DC | PRN
  Administered 2021-11-28 (×2): 100 mg via INTRAVENOUS

## 2021-11-28 MED ORDER — ZOLPIDEM TARTRATE 5 MG PO TABS: 5.0000 mg | ORAL_TABLET | Freq: Every evening | ORAL | Status: DC | PRN

## 2021-11-28 MED ORDER — LOSARTAN POTASSIUM 50 MG PO TABS
100.0000 mg | ORAL_TABLET | Freq: Every day | ORAL | Status: DC
  Administered 2021-11-28: 100 mg via ORAL
  Filled 2021-11-28: qty 2

## 2021-11-28 MED ORDER — PHENYLEPHRINE 80 MCG/ML (10ML) SYRINGE FOR IV PUSH (FOR BLOOD PRESSURE SUPPORT)
PREFILLED_SYRINGE | INTRAVENOUS | Status: DC | PRN
  Administered 2021-11-28 (×3): 80 ug via INTRAVENOUS
  Administered 2021-11-28 (×2): 40 ug via INTRAVENOUS

## 2021-11-28 MED ORDER — PROMETHAZINE HCL 25 MG/ML IJ SOLN: 6.2500 mg | INTRAMUSCULAR | Status: DC | PRN

## 2021-11-28 MED ORDER — BUPIVACAINE-EPINEPHRINE 0.5% -1:200000 IJ SOLN
INTRAMUSCULAR | Status: AC
  Filled 2021-11-28: qty 1

## 2021-11-28 MED ORDER — DEXAMETHASONE SODIUM PHOSPHATE 10 MG/ML IJ SOLN
INTRAMUSCULAR | Status: AC
  Filled 2021-11-28: qty 1

## 2021-11-28 MED ORDER — FENTANYL CITRATE (PF) 250 MCG/5ML IJ SOLN
INTRAMUSCULAR | Status: AC
  Filled 2021-11-28: qty 5

## 2021-11-28 MED ORDER — ROCURONIUM BROMIDE 10 MG/ML (PF) SYRINGE
PREFILLED_SYRINGE | INTRAVENOUS | Status: AC
  Filled 2021-11-28: qty 10

## 2021-11-28 MED ORDER — ACETAMINOPHEN 325 MG PO TABS
650.0000 mg | ORAL_TABLET | ORAL | Status: DC | PRN
  Administered 2021-11-28 – 2021-11-29 (×3): 650 mg via ORAL

## 2021-11-28 MED ORDER — BUPIVACAINE LIPOSOME 1.3 % IJ SUSP
INTRAMUSCULAR | Status: DC | PRN
  Administered 2021-11-28: 20 mL

## 2021-11-28 MED ORDER — DEXAMETHASONE SODIUM PHOSPHATE 10 MG/ML IJ SOLN
INTRAMUSCULAR | Status: DC | PRN
  Administered 2021-11-28: 10 mg via INTRAVENOUS

## 2021-11-28 MED ORDER — SODIUM CHLORIDE (PF) 0.9 % IJ SOLN
INTRAMUSCULAR | Status: AC
  Filled 2021-11-28: qty 20

## 2021-11-28 SURGICAL SUPPLY — 36 items
BENZOIN TINCTURE AMPULE (MISCELLANEOUS) IMPLANT
DRAPE WARM FLUID 44X44 (DRAPES) IMPLANT
DRSG OPSITE POSTOP 4X10 (GAUZE/BANDAGES/DRESSINGS) ×1 IMPLANT
DURAPREP 26ML APPLICATOR (WOUND CARE) ×1 IMPLANT
GAUZE 4X4 16PLY ~~LOC~~+RFID DBL (SPONGE) IMPLANT
GLOVE BIOGEL PI IND STRL 6.5 (GLOVE) IMPLANT
GLOVE BIOGEL PI IND STRL 7.0 (GLOVE) ×2 IMPLANT
GLOVE BIOGEL PI INDICATOR 6.5 (GLOVE) ×1
GLOVE BIOGEL PI INDICATOR 7.0 (GLOVE) ×5
GLOVE ECLIPSE 6.5 STRL STRAW (GLOVE) IMPLANT
GLOVE ECLIPSE 7.0 STRL STRAW (GLOVE) ×1 IMPLANT
GOWN STRL REUS W/TWL LRG LVL3 (GOWN DISPOSABLE) ×3 IMPLANT
HIBICLENS CHG 4% 4OZ BTL (MISCELLANEOUS) ×1 IMPLANT
HOLDER FOLEY CATH W/STRAP (MISCELLANEOUS) IMPLANT
KIT TURNOVER CYSTO (KITS) ×1 IMPLANT
MANIFOLD NEPTUNE II (INSTRUMENTS) IMPLANT
NEEDLE HYPO 22GX1.5 SAFETY (NEEDLE) ×1 IMPLANT
NS IRRIG 1000ML POUR BTL (IV SOLUTION) ×1 IMPLANT
PACK ABDOMINAL GYN (CUSTOM PROCEDURE TRAY) ×1 IMPLANT
PAD ARMBOARD 7.5X6 YLW CONV (MISCELLANEOUS) ×1 IMPLANT
PAD OB MATERNITY 4.3X12.25 (PERSONAL CARE ITEMS) ×1 IMPLANT
SPIKE FLUID TRANSFER (MISCELLANEOUS) IMPLANT
SPONGE T-LAP 18X18 ~~LOC~~+RFID (SPONGE) IMPLANT
STRIP CLOSURE SKIN 1/2X4 (GAUZE/BANDAGES/DRESSINGS) IMPLANT
SUT MNCRL 0 1X36 CT-1 (SUTURE) ×1 IMPLANT
SUT MNCRL 0 MO-4 VIOLET 18 CR (SUTURE) ×3 IMPLANT
SUT MNCRL 0 VIOLET 6X18 (SUTURE) ×1 IMPLANT
SUT MONOCRYL 0 (SUTURE) ×1
SUT MONOCRYL 0 6X18 (SUTURE) ×1
SUT MONOCRYL 0 MO 4 18  CR/8 (SUTURE) ×3
SUT PDS AB 0 CTX 60 (SUTURE) ×2 IMPLANT
SUT VIC AB 4-0 KS 27 (SUTURE) IMPLANT
SYR 20ML LL LF (SYRINGE) IMPLANT
SYR CONTROL 10ML LL (SYRINGE) IMPLANT
TOWEL OR 17X26 10 PK STRL BLUE (TOWEL DISPOSABLE) ×2 IMPLANT
TRAY FOLEY W/BAG SLVR 14FR (SET/KITS/TRAYS/PACK) ×1 IMPLANT

## 2021-11-28 NOTE — Anesthesia Procedure Notes (Signed)
Procedure Name: Intubation Date/Time: 11/28/2021 7:37 AM  Performed by: Rogers Blocker, CRNAPre-anesthesia Checklist: Patient identified, Emergency Drugs available, Suction available and Patient being monitored Patient Re-evaluated:Patient Re-evaluated prior to induction Oxygen Delivery Method: Circle System Utilized Preoxygenation: Pre-oxygenation with 100% oxygen Induction Type: IV induction Ventilation: Mask ventilation without difficulty Laryngoscope Size: Mac and 3 Grade View: Grade I Tube type: Oral Tube size: 7.0 mm Number of attempts: 1 Airway Equipment and Method: Stylet and Bite block Placement Confirmation: ETT inserted through vocal cords under direct vision, positive ETCO2 and breath sounds checked- equal and bilateral Secured at: 22 cm Tube secured with: Tape Dental Injury: Teeth and Oropharynx as per pre-operative assessment

## 2021-11-28 NOTE — Op Note (Signed)
PREOPERATIVE DIAGNOSES: 1. Enlarged fibroid uterus. 2. Pelvic pain, bulk symptoms 3. History of prior C sections  POSTOPERATIVE DIAGNOSES: Same  PROCEDURE PERFORMED: Total abdominal hysterectomy with bilateral salpingectomy  SURGEON: Dr. Alpha Gula ASSISTANT: Dr. Marylynn Pearson  ANESTHESIA: General   ESTIMATED BLOOD LOSS: 250 cc.  URINE OUTPUT: 150  cc of clear urine at the end of the procedure.  FLUIDS: 1500 cc of crystalloids.  COMPLICATIONS: None   TUBES: None.  DRAINS: Foley to gravity.  PATHOLOGY: Uterus, cervix, apparent fibroid were sent to pathology for review.  Specimen weight was 1148 g.    FINDINGS: On exam, under anesthesia, normal appearing vulva and vagina, an enlarged uterus approximately 20 weeks' in size.  Operative findings demonstrated an enlarged uterus with apparent fibroid. Normal fallopian tubes and ovaries bilaterally, s/p tubal ligation. The appendix was normal appearing. The bowel and omentum were normal appearing.  Procedure: The patient was prepped and draped in the usual sterile manner for an abdominal procedure. A pfannenstiel incision was made carried down to the underlying fascia. Fascia was incised in the midline and extended bilaterally with mayo scissors. Underlying rectus muscles were separated from the fascia superiorly and inferiorly in the usual fashion. Scar tissue was noted. Peritoneum was entered sharply with hemostat and metz with good visualization of the bladder and bowel. The uterus was then identified and grasped with upward traction. The round ligaments on either side were identified and individually dissected and ligated with #0 Vicryl suture and divided. This allowed [place] to then create a bladder flap by both blunt and sharp dissection.  Salpingectomy was performed with suture ligation. The fallopian tube and utero-ovarian ligament were isolated through the broad ligament from the uterine body and ligated and then fixated with 0  monocryl and tied with a free tie as well.    We then skeletonized the uterine vessels on either side and carefully dissected the bladder flap anteriorly. Posteriorly, the peritoneum was dissected down toward the uterosacral ligaments. Heaney clamps were then placed at each isthmic portion of the cervical body junction where the uterine arteries adjoined the uterus. These were clamped, ligated and divided using #0 monocryl suture. The remainder of the uterus was then removed by the clamp-cut-ligation technique using #0 monocryl on all major pedicles. With removal of the uterus, the vaginal cuff was closed with two haney stitches and then with serial figure of either stiches using #0 monocryl. Hemostasis was then inspected and secured throughout the entire area including the vaginal cuff, all pedicles, and the bladder. The lap sponges were then removed.  The patient tolerated the operation nicely. There were no complications associated with this surgical procedure to this point. The sponge count was correct times 2 at this time. The Foley catheter was inspected and clear urine was noted. Having removed all instruments and packs, we then began closure of the abdomen. The peritoneum was closed with #2-0 Vicryl in a running continuous manner. The fascia was closed with #0 loop PDS in a running continuous manner and the subcutaneous tissue was also closed with #2-0 Vicryl in a running continuous manner. Hemostasis was secured throughout the entire layers. The incision was then closed as noted in the above operative findings. The patient tolerated the operation nicely and was then taken to the Recovery Room in good condition.    Alpha Gula MD

## 2021-11-28 NOTE — Progress Notes (Signed)
No changes to H&P.  Pre-op HGB 11.5  Again discussed procedure, indications, risks, recovery.  All questions answered, will proceed as planned.  Alpha Gula MD

## 2021-11-28 NOTE — Transfer of Care (Signed)
Immediate Anesthesia Transfer of Care Note  Patient: Rebecca Joseph  Procedure(s) Performed: TOTAL ABDOMINAL HYSTERECTOMY WITH BILATERAL SALPINGECTOMY (Bilateral: Abdomen)  Patient Location: PACU  Anesthesia Type:General  Level of Consciousness: awake, alert , oriented and patient cooperative  Airway & Oxygen Therapy: Patient Spontanous Breathing  Post-op Assessment: Report given to RN and Post -op Vital signs reviewed and stable  Post vital signs: Reviewed and stable  Last Vitals:  Vitals Value Taken Time  BP 136/86 11/28/21 0945  Temp    Pulse 88 11/28/21 0947  Resp 15 11/28/21 0947  SpO2 100 % 11/28/21 0947  Vitals shown include unvalidated device data.  Last Pain:  Vitals:   11/28/21 0603  TempSrc: Oral  PainSc: 0-No pain      Patients Stated Pain Goal: 4 (37/34/28 7681)  Complications: No notable events documented.

## 2021-11-28 NOTE — Anesthesia Postprocedure Evaluation (Signed)
Anesthesia Post Note  Patient: Rebecca Joseph  Procedure(s) Performed: TOTAL ABDOMINAL HYSTERECTOMY WITH BILATERAL SALPINGECTOMY (Bilateral: Abdomen)     Patient location during evaluation: PACU Anesthesia Type: General Level of consciousness: awake and alert Pain management: pain level controlled Vital Signs Assessment: post-procedure vital signs reviewed and stable Respiratory status: spontaneous breathing, nonlabored ventilation and respiratory function stable Cardiovascular status: stable and blood pressure returned to baseline Anesthetic complications: no   No notable events documented.  Last Vitals:  Vitals:   11/28/21 1058 11/28/21 1126  BP: (!) 141/86 124/84  Pulse: 90 86  Resp: 17 15  Temp: 36.4 C 36.6 C  SpO2: 100% 100%                   Audry Pili

## 2021-11-29 ENCOUNTER — Encounter: Payer: Self-pay | Admitting: Nurse Practitioner

## 2021-11-29 MED ORDER — OXYCODONE HCL 5 MG PO TABS: 5.0000 mg | ORAL_TABLET | ORAL | 0 refills | Status: DC | PRN

## 2021-11-29 MED ORDER — IBUPROFEN 600 MG PO TABS: 600.0000 mg | ORAL_TABLET | Freq: Four times a day (QID) | ORAL | 0 refills | Status: DC | PRN

## 2021-11-29 MED ORDER — OXYCODONE HCL 5 MG PO TABS
ORAL_TABLET | ORAL | Status: AC
  Filled 2021-11-29: qty 2

## 2021-11-29 MED ORDER — ACETAMINOPHEN 325 MG PO TABS
ORAL_TABLET | ORAL | Status: AC
  Filled 2021-11-29: qty 2

## 2021-11-29 MED ORDER — ACETAMINOPHEN 325 MG PO TABS: 650.0000 mg | ORAL_TABLET | ORAL | 0 refills | Status: DC | PRN

## 2021-11-29 MED ORDER — IBUPROFEN 200 MG PO TABS
ORAL_TABLET | ORAL | Status: AC
  Filled 2021-11-29: qty 3

## 2021-11-29 NOTE — Discharge Summary (Signed)
Gynecology Discharge Summary  Rebecca Joseph is a 46 y.o. female that presented on 11/28/2021 for scheduled TAH BS for symptomatic fibroid uterus. Her procedure was uncomplicated and her postoperative recovery was uncomplicated. On POD#1 she reported well controlled pain, spontaneous voiding, ambulating without difficulty, and tolerating PO.  She was stable for discharge home on 11/29/21 with plans for in-office follow up.  Hemoglobin  Date Value Ref Range Status  11/26/2021 11.5 (L) 12.0 - 15.0 g/dL Final  04/25/2021 11.4 11.1 - 15.9 g/dL Final   HCT  Date Value Ref Range Status  11/26/2021 36.9 36.0 - 46.0 % Final   Hematocrit  Date Value Ref Range Status  04/25/2021 35.6 34.0 - 46.6 % Final    Physical Exam:  Gen: alert, well appearing, no distress Chest: nonlabored breathing CV: no peripheral edema Abdomen: soft, ATTP, nondistended Ext: no evidence of DVT Incision: dressing in place with no shadowing  Discharge Diagnoses: s/p TAH BS  Discharge Information: Date: 11/29/2021 Activity: Pelvic rest, as tolerated Diet: routine Medications: Tylenol, motrin, oxycodone Condition: Stable Instructions: Refer to practice specific booklet.  Discussed prior to discharge.  Discharge to: [facility] Follow up.   Why: Please follow up for a postoperative visit. Our office will reach out to schedule. Contact information: [place] [address] [phone]                Carlyon Shadow 11/29/2021, 9:18 PM

## 2021-11-29 NOTE — Progress Notes (Signed)
Postoperative Progress Note  POD#1 s/p TAH BS for fibrod uerus.  Subjective:  Patient reports no overnight events.  She reports well controlled pain, ambulating without difficulty, voiding spontaneously, tolerating PO.  She reports Positive flatus, Negative BM.  Vaginal bleeding is none.  Objective: Blood pressure 124/79, pulse 89, temperature 98.4 F (36.9 C), resp. rate 20, height '5\' 5"'$  (1.651 m), weight 84.4 kg, last menstrual period 11/28/2021, SpO2 100 %.  Physical Exam:  Gen: alert, well appearing, no distress Chest: nonlabored breathing CV: no peripheral edema Abdomen: soft, nondistended, ATTP Incision: dressing in place without shadowing Ext: no evidence of DVT  Recent Labs    11/26/21 0944  HGB 11.5*  HCT 36.9    Assessment/Plan: Postoperative Day #1 s/p TAH BS Patient doing well, meeting appropriate milestones Discussed procedure and pain control and recovery. Discussed wound care. Discussed precautions.  Office will reach out for follow up post op visit. All questions answered.    LOS: 0 days   Rebecca Joseph 11/29/2021, 7:16 AM

## 2021-11-30 ENCOUNTER — Other Ambulatory Visit: Payer: Self-pay | Admitting: Nurse Practitioner

## 2021-11-30 ENCOUNTER — Encounter (HOSPITAL_BASED_OUTPATIENT_CLINIC_OR_DEPARTMENT_OTHER): Payer: Self-pay | Admitting: Obstetrics and Gynecology

## 2021-11-30 DIAGNOSIS — I10 Essential (primary) hypertension: Secondary | ICD-10-CM

## 2021-11-30 LAB — SURGICAL PATHOLOGY

## 2021-11-30 MED ORDER — CARVEDILOL 12.5 MG PO TABS: 12.5000 mg | ORAL_TABLET | Freq: Two times a day (BID) | ORAL | 3 refills | Status: DC

## 2021-12-14 ENCOUNTER — Other Ambulatory Visit (HOSPITAL_COMMUNITY): Payer: Self-pay

## 2021-12-17 ENCOUNTER — Other Ambulatory Visit: Payer: PRIVATE HEALTH INSURANCE | Admitting: Pharmacist

## 2021-12-17 ENCOUNTER — Ambulatory Visit: Payer: PRIVATE HEALTH INSURANCE

## 2021-12-17 NOTE — Patient Instructions (Signed)
Rebecca Joseph,   It was great talking to you today!  Consider getting a home blood pressure cuff. You can call the customer service number on the back of your insurance card to see if you have any coverage for a home blood pressure machine. If so and if you need a prescription for one, please call us back and let us know where to send that prescription.   If not, please consider purchasing a good quality blood pressure machine, such as an Omron-brand upper arm cuff. This can be purchased from any pharmacy, including our [facility] outpatient pharmacies, for ~$28-$30. We recommend a blood pressure cuff that goes around your upper arm, as these are generally the most accurate.   Check your blood pressure periodically, and any time you have concerning symptoms like headache, chest pain, dizziness, shortness of breath, or vision changes.   Our goal is less than 130/80.  To appropriately check your blood pressure, make sure you do the following:  1) Avoid caffeine, exercise, or tobacco products for 30 minutes before checking. Empty your bladder. 2) Sit with your back supported in a flat-backed chair. Rest your arm on something flat (arm of the chair, table, etc). 3) Sit still with your feet flat on the floor, resting, for at least 5 minutes.  4) Check your blood pressure. Take 1-2 readings.  5) Write down these readings and bring with you to any provider appointments.  Bring your home blood pressure machine with you to a provider's office for accuracy comparison at least once a year.   Make sure you take your blood pressure medications before you come to any office visit, even if you were asked to fast for labs.   Take care!  Catie Hedwig Morton, PharmD, [facility] [phone]

## 2021-12-17 NOTE — Progress Notes (Signed)
Chief Complaint  Patient presents with   Hypertension    Rebecca Joseph is a 46 y.o. year old female who presented for a telephone visit.   They were referred to the pharmacist by a quality report for assistance in managing hypertension.   Subjective:  Care Team: Primary Care Provider: [name], NP ; Next Scheduled Visit: not scheduled  Medication Access/Adherence  Current Pharmacy:  [id number] [place] [place]Phone: [phone]: [phone] [place] [address] [place]Phone: [phone]: [phone]  Patient reports affordability concerns with their medications: No  Patient reports access/transportation concerns to their pharmacy: No  Patient reports adherence concerns with their medications:  No     Hypertension:  Current medications: losartan 100 mg daily, carvedilol 12.5 mg twice daily  Prior medications: amlodipine (lower extremity edema, even on 5 mg); HCTZ (gout flare, even on allopurinol).   Patient does not have a validated, automated, upper arm home BP cuff, she is checking periodically with a friend's cuff Current blood pressure readings readings: reports a reading last week of ~120/60 with HR 87  Patient denies hypotensive s/sx including dizziness, lightheadedness.   Gout: Current medications: none; previously on allopurinol but she stopped because she didn't think she needed it. She was to get an updated uric acid checked per PCP's last appointment, uric acid was ordered  Health Maintenance  Health Maintenance Due  Topic Date Due   COVID-19 Vaccine (4 - Pfizer series) 04/12/2020   COLONOSCOPY (Pts 45-453yrInsurance coverage will need to be confirmed)  Never done   INFLUENZA VACCINE  11/06/2021     Objective: No  results found for: "HGBA1C"  Lab Results  Component Value Date   CREATININE 0.83 11/26/2021   BUN 15 11/26/2021   NA 137 11/26/2021   K 3.9 11/26/2021   CL 109 11/26/2021   CO2 22 11/26/2021    Lab Results  Component Value Date   CHOL 148 07/24/2020   HDL 39 (L) 07/24/2020   LDLCALC 76 07/24/2020   TRIG 195 (H) 07/24/2020   CHOLHDL 3.8 07/24/2020    Medications Reviewed Today     Reviewed by HaOsker MasonRPH-CPP (Pharmacist) on 12/17/21 at 1001  Med List Status: <None>   Medication Order Taking? Sig Documenting Provider Last Dose Status Informant  acetaminophen (TYLENOL) 325 MG tablet [id number] Take 2 tablets (650 mg total) by mouth every 4 (four) hours as needed for mild pain (temperature > 101.5.). GlCarlyon ShadowMD Taking Active   carvedilol (COREG) 12.5 MG tablet [id number] Take 1 tablet (12.5 mg total) by mouth 2 (two) times daily with a meal. FlGildardo PoundsNP Taking Active            Med Note (HJodi MourningCATHERINE T   Mon Dec 17, 2021 10:00 AM) Taking 25 mg BID  Ferrous Sulfate (IRON PO) [id number] Take by mouth. Blood builder [provider] Taking Active Self  gemfibrozil (LOPID) 600 MG tablet [id number] Take 1 tablet (600 mg total) by mouth 2 (two) times daily before a meal. FlGildardo PoundsNP Taking Active Self  hydrOXYzine (ATARAX) 25 MG tablet [id number] Take 0.5-1 tablets (12.5-25 mg total) by mouth 3 (three) times daily as needed. FlGildardo PoundsNP Taking Active Self  ibuprofen (ADVIL)  600 MG tablet [id number] Yes Take 1 tablet (600 mg total) by mouth every 6 (six) hours as needed for mild pain or moderate pain. Carlyon Shadow, MD Taking Active   loratadine (CLARITIN) 10 MG tablet [id number] No Take 1 tablet (10 mg total) by mouth daily.  Patient not taking: Reported on 12/17/2021   [name], NP Not Taking Active Self  losartan (COZAAR) 100 MG tablet [id number] Yes Take 1 tablet (100 mg total) by mouth daily.  [name], NP Taking Active Self  oxyCODONE (OXY IR/ROXICODONE) 5 MG immediate release tablet [id number] Yes Take 1 tablet (5 mg total) by mouth every 4 (four) hours as needed for moderate pain. Carlyon Shadow, MD Taking Active               Assessment/Plan:   Hypertension: - Currently controlled per home readings - Reviewed long term cardiovascular and renal outcomes of uncontrolled blood pressure - Reviewed appropriate blood pressure monitoring technique and reviewed goal blood pressure. Recommended to check home blood pressure and heart rate periodically. Encouraged to consider purchasing a new home BP cuff - Recommend to continue current regimen at this time. Assisted in scheduling visit with embedded pharmacist tomorrow. Patient can also get uric acid checked per PCP.      Follow Up Plan: embedded pharmacist tomorrow  Catie Hedwig Morton, PharmD, [facility] [phone]

## 2021-12-18 ENCOUNTER — Ambulatory Visit: Payer: Self-pay | Admitting: Pharmacist

## 2022-01-07 ENCOUNTER — Encounter: Payer: Self-pay | Admitting: Nurse Practitioner

## 2022-01-08 ENCOUNTER — Other Ambulatory Visit: Payer: Self-pay | Admitting: Nurse Practitioner

## 2022-01-08 ENCOUNTER — Encounter: Payer: Self-pay | Admitting: Nurse Practitioner

## 2022-01-08 DIAGNOSIS — U071 COVID-19: Secondary | ICD-10-CM

## 2022-01-08 MED ORDER — PSEUDOEPH-BROMPHEN-DM 30-2-10 MG/5ML PO SYRP: 5.0000 mL | ORAL_SOLUTION | Freq: Four times a day (QID) | ORAL | 0 refills | Status: DC | PRN

## 2022-01-28 ENCOUNTER — Encounter: Payer: Self-pay | Admitting: *Deleted

## 2022-02-22 ENCOUNTER — Encounter: Payer: Self-pay | Admitting: Nurse Practitioner

## 2022-02-22 ENCOUNTER — Other Ambulatory Visit: Payer: Self-pay | Admitting: Nurse Practitioner

## 2022-02-22 DIAGNOSIS — S80869D Insect bite (nonvenomous), unspecified lower leg, subsequent encounter: Secondary | ICD-10-CM

## 2022-02-22 MED ORDER — HYDROCORTISONE 2.5 % EX CREA: TOPICAL_CREAM | Freq: Two times a day (BID) | CUTANEOUS | 0 refills | Status: DC

## 2022-02-26 ENCOUNTER — Other Ambulatory Visit: Payer: Self-pay | Admitting: Nurse Practitioner

## 2022-02-26 DIAGNOSIS — I10 Essential (primary) hypertension: Secondary | ICD-10-CM

## 2022-03-08 ENCOUNTER — Other Ambulatory Visit: Payer: Self-pay | Admitting: Pharmacist

## 2022-03-08 DIAGNOSIS — I10 Essential (primary) hypertension: Secondary | ICD-10-CM

## 2022-03-08 MED ORDER — LOSARTAN POTASSIUM 100 MG PO TABS: 100.0000 mg | ORAL_TABLET | Freq: Every day | ORAL | 0 refills | Status: DC

## 2022-04-11 ENCOUNTER — Encounter: Payer: Self-pay | Admitting: Nurse Practitioner

## 2022-04-12 ENCOUNTER — Other Ambulatory Visit: Payer: Self-pay | Admitting: Nurse Practitioner

## 2022-04-12 DIAGNOSIS — R399 Unspecified symptoms and signs involving the genitourinary system: Secondary | ICD-10-CM

## 2022-05-02 ENCOUNTER — Other Ambulatory Visit: Payer: Self-pay | Admitting: Nurse Practitioner

## 2022-05-02 DIAGNOSIS — F32A Depression, unspecified: Secondary | ICD-10-CM

## 2022-05-02 DIAGNOSIS — E783 Hyperchylomicronemia: Secondary | ICD-10-CM

## 2022-05-02 DIAGNOSIS — I10 Essential (primary) hypertension: Secondary | ICD-10-CM

## 2022-05-02 NOTE — Telephone Encounter (Signed)
Copied from [place] [id number]. Topic: General - Other >> May 02, 2022  9:34 AM Everette C wrote: Reason for CRM: Medication Refill - Medication: losartan (COZAAR) 100 MG tablet [[id number]]  carvedilol (COREG) 12.5 MG tablet [[id number]]  hydrOXYzine (ATARAX) 25 MG tablet [[id number]]  Rx #: [id number]  gemfibrozil (LOPID) 600 MG tablet [[id number]]   Has the patient contacted their pharmacy? Yes.  The patient's pharmacy has made contact on their behalf.  (Agent: If no, request that the patient contact the pharmacy for the refill. If patient does not wish to contact the pharmacy document the reason why and proceed with request.) (Agent: If yes, when and what did the pharmacy advise?)  Preferred Pharmacy (with phone number or street name): [place], [place] [address] Phone: [phone] Fax: [phone] Hours: Not open 24 hours   Has the patient been seen for an appointment in the last year OR does the patient have an upcoming appointment? Yes.    Agent: Please be advised that RX refills may take up to 3 business days. We ask that you follow-up with your pharmacy.

## 2022-05-02 NOTE — Telephone Encounter (Signed)
Requested medication (s) are due for refill today: yes  Requested medication (s) are on the active medication list: yes  Last refill:  gemfibrozil 09/11/21 #180/3, carvedilol 02/26/22 #180/0, hydroxyzine 11/13/21 #60/3, losartan 03/08/22 #30/0  Future visit scheduled: yes  Notes to clinic:  pt is due for ov and updated labs. Has appt scheduled for 05/14/22     Requested Prescriptions  Pending Prescriptions Disp Refills   losartan (COZAAR) 100 MG tablet 30 tablet 0    Sig: Take 1 tablet (100 mg total) by mouth daily.     Cardiovascular:  Angiotensin Receptor Blockers Passed - 05/02/2022  9:58 AM      Passed - Cr in normal range and within 180 days    Creatinine, Ser  Date Value Ref Range Status  11/26/2021 0.83 0.44 - 1.00 mg/dL Final         Passed - K in normal range and within 180 days    Potassium  Date Value Ref Range Status  11/26/2021 3.9 3.5 - 5.1 mmol/L Final         Passed - Patient is not pregnant      Passed - Last BP in normal range    BP Readings from Last 1 Encounters:  11/29/21 128/88         Passed - Valid encounter within last 6 months    Recent Outpatient Visits           5 months ago Essential hypertension   [place], Vernia Buff, NP   1 year ago Essential hypertension   [place], Vernia Buff, NP   1 year ago Essential hypertension   [place], Vernia Buff, NP   1 year ago Essential hypertension   [place], Vernia Buff, NP   2 years ago Essential hypertension   [place], RPH-CPP       Future Appointments             In 1 week [name], NP [place]             hydrOXYzine (ATARAX) 25 MG tablet 60 tablet 3    Sig: Take 0.5-1 tablets (12.5-25 mg total) by mouth 3 (three)  times daily as needed.     Ear, Nose, and Throat:  Antihistamines 2 Passed - 05/02/2022  9:58 AM      Passed - Cr in normal range and within 360 days    Creatinine, Ser  Date Value Ref Range Status  11/26/2021 0.83 0.44 - 1.00 mg/dL Final         Passed - Valid encounter within last 12 months    Recent Outpatient Visits           5 months ago Essential hypertension   [place], Vernia Buff, NP   1 year ago Essential hypertension   [place], Vernia Buff, NP   1 year ago Essential hypertension   [place], Vernia Buff, NP   1 year ago Essential hypertension   [place], Vernia Buff, NP   2 years ago Essential hypertension   [place], Annie Main  L, RPH-CPP       Future Appointments             In 1 week [name], NP [place]             carvedilol (COREG) 12.5 MG tablet 180 tablet 0     Cardiovascular: Beta Blockers 3 Failed - 05/02/2022  9:58 AM      Failed - AST in normal range and within 360 days    AST  Date Value Ref Range Status  04/25/2021 14 0 - 40 IU/L Final         Failed - ALT in normal range and within 360 days    ALT  Date Value Ref Range Status  04/25/2021 9 0 - 32 IU/L Final         Passed - Cr in normal range and within 360 days    Creatinine, Ser  Date Value Ref Range Status  11/26/2021 0.83 0.44 - 1.00 mg/dL Final         Passed - Last BP in normal range    BP Readings from Last 1 Encounters:  11/29/21 128/88         Passed - Last Heart Rate in normal range    Pulse Readings from Last 1 Encounters:  11/29/21 (!) 102         Passed - Valid encounter within last 6 months    Recent Outpatient Visits           5 months ago Essential hypertension   [place] W, NP   1 year ago Essential hypertension   [place], [place] W, NP   1 year ago Essential hypertension   [place], [place] W, NP   1 year ago Essential hypertension   [place] W, NP   2 years ago Essential hypertension   [place], RPH-CPP       Future Appointments             In 1 week [name], NP [place]             gemfibrozil (LOPID) 600 MG tablet 180 tablet 3    Sig: Take 1 tablet (600 mg total) by mouth 2 (two) times daily before a meal.     Cardiovascular:  Antilipid - Fibric Acid Derivatives Failed - 05/02/2022  9:58 AM      Failed - ALT in normal range and within 360 days    ALT  Date Value Ref Range Status  04/25/2021 9 0 - 32 IU/L Final         Failed - AST in normal range and within 360 days    AST  Date Value Ref Range Status  04/25/2021 14 0 - 40 IU/L Final         Failed - HGB in normal range and within 360 days    Hemoglobin  Date Value Ref Range Status  11/26/2021 11.5 (L) 12.0 - 15.0 g/dL Final  04/25/2021 11.4 11.1 - 15.9 g/dL Final         Failed - Lipid Panel in normal range within the last 12 months    Cholesterol, Total  Date Value Ref Range Status  07/24/2020 148 100 - 199 mg/dL Final   LDL Chol Calc (NIH)  Date Value Ref Range Status  07/24/2020 76 0 - 99 mg/dL Final   HDL  Date Value Ref Range Status  07/24/2020 39 (L) >39 mg/dL Final   Triglycerides  Date Value Ref Range Status  07/24/2020 195 (H) 0 - 149 mg/dL Final         Passed - Cr in normal range and within 360 days    Creatinine, Ser  Date Value Ref Range Status  11/26/2021 0.83 0.44 - 1.00 mg/dL Final         Passed - HCT in normal range and within 360 days    HCT  Date Value Ref Range  Status  11/26/2021 36.9 36.0 - 46.0 % Final   Hematocrit  Date Value Ref Range Status  04/25/2021 35.6 34.0 - 46.6 % Final         Passed - PLT in normal range and within 360 days    Platelets  Date Value Ref Range Status  11/26/2021 344 150 - 400 K/uL Final  04/25/2021 348 150 - 450 x10E3/uL Final         Passed - WBC in normal range and within 360 days    WBC  Date Value Ref Range Status  11/26/2021 8.1 4.0 - 10.5 K/uL Final         Passed - eGFR is 30 or above and within 360 days    GFR calc Af Amer  Date Value Ref Range Status  02/09/2020 125 >59 mL/min/1.73 Final    Comment:    **In accordance with recommendations from the NKF-ASN Task force,**   [facility] is in the process of updating its eGFR calculation to the   2021 CKD-EPI creatinine equation that estimates kidney function   without a race variable.    GFR, Estimated  Date Value Ref Range Status  11/26/2021 >60 >60 mL/min Final    Comment:    (NOTE) Calculated using the CKD-EPI Creatinine Equation (2021)    eGFR  Date Value Ref Range Status  04/25/2021 91 >59 mL/min/1.73 Final         Passed - Valid encounter within last 12 months    Recent Outpatient Visits           5 months ago Essential hypertension   [place], Vernia Buff, NP   1 year ago Essential hypertension   [place], Vernia Buff, NP   1 year ago Essential hypertension   [place], Vernia Buff, NP   1 year ago Essential hypertension   [place], Vernia Buff, NP   2 years ago Essential hypertension   [place], RPH-CPP       Future Appointments             In 1 week [name], NP [place]

## 2022-05-03 MED ORDER — GEMFIBROZIL 600 MG PO TABS: 600.0000 mg | ORAL_TABLET | Freq: Two times a day (BID) | ORAL | 0 refills | Status: DC

## 2022-05-03 MED ORDER — CARVEDILOL 12.5 MG PO TABS: 12.5000 mg | ORAL_TABLET | Freq: Two times a day (BID) | ORAL | 0 refills | Status: DC

## 2022-05-03 MED ORDER — HYDROXYZINE HCL 25 MG PO TABS: 12.5000 mg | ORAL_TABLET | Freq: Three times a day (TID) | ORAL | 3 refills | Status: DC | PRN

## 2022-05-03 MED ORDER — LOSARTAN POTASSIUM 100 MG PO TABS: 100.0000 mg | ORAL_TABLET | Freq: Every day | ORAL | 0 refills | Status: DC

## 2022-05-14 ENCOUNTER — Ambulatory Visit: Payer: PRIVATE HEALTH INSURANCE | Admitting: Nurse Practitioner

## 2022-05-19 ENCOUNTER — Other Ambulatory Visit: Payer: Self-pay | Admitting: Family Medicine

## 2022-05-19 DIAGNOSIS — I10 Essential (primary) hypertension: Secondary | ICD-10-CM

## 2022-05-28 ENCOUNTER — Ambulatory Visit: Payer: PRIVATE HEALTH INSURANCE | Admitting: Nurse Practitioner

## 2022-06-12 ENCOUNTER — Other Ambulatory Visit: Payer: Self-pay | Admitting: Family Medicine

## 2022-06-12 DIAGNOSIS — I10 Essential (primary) hypertension: Secondary | ICD-10-CM

## 2022-06-14 MED ORDER — LOSARTAN POTASSIUM 100 MG PO TABS: 100.0000 mg | ORAL_TABLET | Freq: Every day | ORAL | 0 refills | Status: DC

## 2022-06-21 ENCOUNTER — Other Ambulatory Visit: Payer: Self-pay | Admitting: Family Medicine

## 2022-06-21 ENCOUNTER — Other Ambulatory Visit: Payer: Self-pay | Admitting: Nurse Practitioner

## 2022-06-21 ENCOUNTER — Encounter: Payer: Self-pay | Admitting: Nurse Practitioner

## 2022-06-21 DIAGNOSIS — I10 Essential (primary) hypertension: Secondary | ICD-10-CM

## 2022-06-21 MED ORDER — LOSARTAN POTASSIUM 100 MG PO TABS: 100.0000 mg | ORAL_TABLET | Freq: Every day | ORAL | 0 refills | Status: DC

## 2022-07-02 ENCOUNTER — Other Ambulatory Visit: Payer: Self-pay | Admitting: Nurse Practitioner

## 2022-07-02 ENCOUNTER — Encounter: Payer: Self-pay | Admitting: Nurse Practitioner

## 2022-07-02 ENCOUNTER — Ambulatory Visit: Payer: PRIVATE HEALTH INSURANCE | Attending: Nurse Practitioner | Admitting: Nurse Practitioner

## 2022-07-02 VITALS — BP 144/90 | HR 80 | Ht 65.0 in | Wt 188.0 lb

## 2022-07-02 DIAGNOSIS — E7800 Pure hypercholesterolemia, unspecified: Secondary | ICD-10-CM

## 2022-07-02 DIAGNOSIS — Z1211 Encounter for screening for malignant neoplasm of colon: Secondary | ICD-10-CM

## 2022-07-02 DIAGNOSIS — I10 Essential (primary) hypertension: Secondary | ICD-10-CM

## 2022-07-02 DIAGNOSIS — Z1231 Encounter for screening mammogram for malignant neoplasm of breast: Secondary | ICD-10-CM

## 2022-07-02 DIAGNOSIS — M546 Pain in thoracic spine: Secondary | ICD-10-CM

## 2022-07-02 DIAGNOSIS — D649 Anemia, unspecified: Secondary | ICD-10-CM

## 2022-07-02 MED ORDER — LOSARTAN POTASSIUM 100 MG PO TABS: 100.0000 mg | ORAL_TABLET | Freq: Every day | ORAL | 1 refills | Status: DC

## 2022-07-02 MED ORDER — CYCLOBENZAPRINE HCL 5 MG PO TABS: 5.0000 mg | ORAL_TABLET | Freq: Three times a day (TID) | ORAL | 0 refills | Status: DC | PRN

## 2022-07-02 MED ORDER — CARVEDILOL 25 MG PO TABS: 25.0000 mg | ORAL_TABLET | Freq: Two times a day (BID) | ORAL | 0 refills | Status: DC

## 2022-07-02 NOTE — Progress Notes (Signed)
Lower back pain on the right side.

## 2022-07-02 NOTE — Progress Notes (Signed)
Assessment & Plan:  Kolby was seen today for hypertension.  Diagnoses and all orders for this visit:  Primary hypertension Increase carvedilol to 25 mg twice daily -     losartan (COZAAR) 100 MG tablet; Take 1 tablet (100 mg total) by mouth daily. -     CMP14+EGFR -     carvedilol (COREG) 25 MG tablet; Take 1 tablet (25 mg total) by mouth 2 (two) times daily with a meal.  Screening for colon cancer -     Ambulatory referral to Gastroenterology  Anemia, unspecified type -     CBC with Differential  Hypercholesterolemia -     Lipid panel  Acute right-sided thoracic back pain -     cyclobenzaprine (FLEXERIL) 5 MG tablet; Take 1 tablet (5 mg total) by mouth 3 (three) times daily as needed for muscle spasms. May alternate with heat and ice application for pain relief. May also alternate with acetaminophen and Ibuprofen as prescribed for back pain. Other alternatives include massage, acupuncture and water aerobics.      Patient has been counseled on age-appropriate routine health concerns for screening and prevention. These are reviewed and up-to-date. Referrals have been placed accordingly. Immunizations are up-to-date or declined.    Subjective:   Chief Complaint  Patient presents with   Hypertension   HPI Rebecca Joseph 47 y.o. female presents to office today for follow-up to hypertension. She states aside from some right-sided thoracic back pain she has been feeling good since her total abdominal hysterectomy last August  She has a past medical history of Anemia (2018), Anxiety, COVID-19 (11/24/2020), GERD, Gout, Headache, iron deficiency anemia, Hyperlipidemia, Hypertension, Overactive bladder (2023), Uterine leiomyoma (2023), and Vitamin D deficiency (2022).    HTN Blood pressure is elevated today and she reports similar readings at home.  At this time we will increase carvedilol and she will continue on losartan 100 mg daily. BP Readings from Last 3  Encounters:  07/02/22 (!) 144/90  11/29/21 128/88  11/27/21 (!) 140/94    Back Pain: Patient presents for presents evaluation of right-sided thoracic back problems.  Symptoms have been present for several days and include stiffness in tightness . Initial inciting event: none. Symptoms are worst: no particular time of the day. Exacerbating factors identifiable by patient are  twisting, turning, lying . Treatments so far initiated by patient:  Heating pad  Previous lower back problems: none. Previous workup: none. Previous treatments: none.    Review of Systems  Constitutional:  Negative for fever, malaise/fatigue and weight loss.  HENT: Negative.  Negative for nosebleeds.   Eyes: Negative.  Negative for blurred vision, double vision and photophobia.  Respiratory: Negative.  Negative for cough and shortness of breath.   Cardiovascular: Negative.  Negative for chest pain, palpitations and leg swelling.  Gastrointestinal: Negative.  Negative for heartburn, nausea and vomiting.  Musculoskeletal:  Positive for back pain and myalgias.  Neurological: Negative.  Negative for dizziness, focal weakness, seizures and headaches.  Psychiatric/Behavioral: Negative.  Negative for suicidal ideas.     Past Medical History:  Diagnosis Date   Anemia 2018   iron deficiency, 2021 Hgb 6.3, most recent CBC as of 11/14/21,  Hgb 11.4 on 04/25/21 in Epic, iron infusions in 2021   Anxiety    Follows with [name], NP, LOV 11/13/21.   COVID-19 11/24/2020   treated with anti-viral mediciation   GERD (gastroesophageal reflux disease)    occasional Tums or Rolaids   Gout    hx  of gout   Headache    occasional   Hx of iron deficiency anemia    2018 & 2021, Hgb on 04/25/21 11.4   Hyperlipidemia    As of 11/14/21, pt is currently taking Lopid.   Hypertension    Pt follows with [name], NP @ [facility], LOV (video) 11/13/21.   Overactive bladder 2023   w/ urinary incontinence   Uterine  leiomyoma 2023   w/ menorrhagia   Vitamin D deficiency 2022   hx of, no longer takes supplements as of 11/21/21    Past Surgical History:  Procedure Laterality Date   CESAREAN SECTION  2004   HYSTERECTOMY ABDOMINAL WITH SALPINGECTOMY Bilateral 11/28/2021   Procedure: TOTAL ABDOMINAL HYSTERECTOMY WITH BILATERAL SALPINGECTOMY;  Surgeon: Carlyon Shadow, MD;  Location: [facility];  Service: Gynecology;  Laterality: Bilateral;   IR RADIOLOGIST EVAL & MGMT  07/12/2020   for fibroids, pt did not have procedure done   TUBAL LIGATION  2004    Family History  Problem Relation Age of Onset   Diabetes Father    Hypertension Father    Heart disease Father        CHF   Gout Father    Hypertension Mother    Breast cancer Maternal Aunt    Cancer Maternal Aunt    Breast cancer Cousin    Cancer Cousin        Maternal Cousin died from breast cancer    Social History Reviewed with no changes to be made today.   Outpatient Medications Prior to Visit  Medication Sig Dispense Refill   acetaminophen (TYLENOL) 325 MG tablet Take 2 tablets (650 mg total) by mouth every 4 (four) hours as needed for mild pain (temperature > 101.5.). 30 tablet 0   gemfibrozil (LOPID) 600 MG tablet Take 1 tablet (600 mg total) by mouth 2 (two) times daily before a meal. 60 tablet 0   hydrocortisone 2.5 % cream Apply topically 2 (two) times daily. 30 g 0   hydrOXYzine (ATARAX) 25 MG tablet Take 0.5-1 tablets (12.5-25 mg total) by mouth 3 (three) times daily as needed. 60 tablet 3   ibuprofen (ADVIL) 600 MG tablet Take 1 tablet (600 mg total) by mouth every 6 (six) hours as needed for mild pain or moderate pain. 30 tablet 0   loratadine (CLARITIN) 10 MG tablet Take 1 tablet (10 mg total) by mouth daily. 30 tablet 0   carvedilol (COREG) 12.5 MG tablet TAKE 1 TABLET (12.5MG  TOTAL) BY MOUTH TWICE A DAY WITH MEALS 180 tablet 0   losartan (COZAAR) 100 MG tablet Take 1 tablet (100 mg total) by mouth daily. 5  tablet 0   Ferrous Sulfate (IRON PO) Take by mouth. Blood builder     brompheniramine-pseudoephedrine-DM 30-2-10 MG/5ML syrup Take 5 mLs by mouth 4 (four) times daily as needed. 240 mL 0   oxyCODONE (OXY IR/ROXICODONE) 5 MG immediate release tablet Take 1 tablet (5 mg total) by mouth every 4 (four) hours as needed for moderate pain. 18 tablet 0   No facility-administered medications prior to visit.    Allergies  Allergen Reactions   Naproxen Shortness Of Breath and Other (See Comments)   Amlodipine Swelling    BLE EDEMA   Hctz [Hydrochlorothiazide] Other (See Comments)    GOUT       Objective:    BP (!) 144/90   Pulse 80   Ht 5\' 5"  (1.651 m)   Wt 188  lb (85.3 kg)   LMP 11/28/2021 (Approximate)   SpO2 98%   BMI 31.28 kg/m  Wt Readings from Last 3 Encounters:  07/02/22 188 lb (85.3 kg)  11/28/21 186 lb (84.4 kg)  11/26/21 186 lb 6.4 oz (84.6 kg)    Physical Exam Vitals and nursing note reviewed.  Constitutional:      Appearance: She is well-developed.  HENT:     Head: Normocephalic and atraumatic.  Cardiovascular:     Rate and Rhythm: Normal rate and regular rhythm.     Heart sounds: Normal heart sounds. No murmur heard.    No friction rub. No gallop.  Pulmonary:     Effort: Pulmonary effort is normal. No tachypnea or respiratory distress.     Breath sounds: Normal breath sounds. No decreased breath sounds, wheezing, rhonchi or rales.  Chest:     Chest wall: No tenderness.  Abdominal:     General: Bowel sounds are normal.     Palpations: Abdomen is soft.  Musculoskeletal:        General: Normal range of motion.     Cervical back: Normal range of motion.  Skin:    General: Skin is warm and dry.  Neurological:     Mental Status: She is alert and oriented to person, place, and time.     Coordination: Coordination normal.  Psychiatric:        Behavior: Behavior normal. Behavior is cooperative.        Thought Content: Thought content normal.        Judgment:  Judgment normal.          Patient has been counseled extensively about nutrition and exercise as well as the importance of adherence with medications and regular follow-up. The patient was given clear instructions to go to ER or return to medical center if symptoms don't improve, worsen or new problems develop. The patient verbalized understanding.   Follow-up: Return in about 4 weeks (around 07/30/2022) for Tuesday or Wednesday morning in 4 weeks HTN.   [name], FNP-BC [facility] and [place], [place]   07/02/2022, 1:20 PM

## 2022-07-03 ENCOUNTER — Encounter: Payer: Self-pay | Admitting: Nurse Practitioner

## 2022-07-03 LAB — CMP14+EGFR
ALT: 9 IU/L (ref 0–32)
AST: 15 IU/L (ref 0–40)
Albumin/Globulin Ratio: 1.3 (ref 1.2–2.2)
Albumin: 4 g/dL (ref 3.9–4.9)
Alkaline Phosphatase: 128 IU/L — ABNORMAL HIGH (ref 44–121)
BUN/Creatinine Ratio: 20 (ref 9–23)
BUN: 16 mg/dL (ref 6–24)
Bilirubin Total: 0.8 mg/dL (ref 0.0–1.2)
CO2: 21 mmol/L (ref 20–29)
Calcium: 9.5 mg/dL (ref 8.7–10.2)
Chloride: 101 mmol/L (ref 96–106)
Creatinine, Ser: 0.79 mg/dL (ref 0.57–1.00)
Globulin, Total: 3.1 g/dL (ref 1.5–4.5)
Glucose: 88 mg/dL (ref 70–99)
Potassium: 4.1 mmol/L (ref 3.5–5.2)
Sodium: 136 mmol/L (ref 134–144)
Total Protein: 7.1 g/dL (ref 6.0–8.5)
eGFR: 93 mL/min/{1.73_m2} (ref >59)

## 2022-07-20 ENCOUNTER — Other Ambulatory Visit: Payer: Self-pay | Admitting: Nurse Practitioner

## 2022-07-20 ENCOUNTER — Encounter: Payer: Self-pay | Admitting: Nurse Practitioner

## 2022-07-20 DIAGNOSIS — I10 Essential (primary) hypertension: Secondary | ICD-10-CM

## 2022-07-20 MED ORDER — LOSARTAN POTASSIUM 100 MG PO TABS: 100.0000 mg | ORAL_TABLET | Freq: Every day | ORAL | 1 refills | Status: DC

## 2022-07-31 ENCOUNTER — Ambulatory Visit: Payer: PRIVATE HEALTH INSURANCE | Attending: Nurse Practitioner | Admitting: Nurse Practitioner

## 2022-07-31 ENCOUNTER — Encounter: Payer: Self-pay | Admitting: Nurse Practitioner

## 2022-07-31 VITALS — BP 128/82 | HR 76 | Ht 65.0 in | Wt 190.8 lb

## 2022-07-31 DIAGNOSIS — M546 Pain in thoracic spine: Secondary | ICD-10-CM

## 2022-07-31 DIAGNOSIS — I10 Essential (primary) hypertension: Secondary | ICD-10-CM | POA: Diagnosis not present

## 2022-07-31 DIAGNOSIS — J302 Other seasonal allergic rhinitis: Secondary | ICD-10-CM

## 2022-07-31 DIAGNOSIS — G8929 Other chronic pain: Secondary | ICD-10-CM

## 2022-07-31 MED ORDER — CYCLOBENZAPRINE HCL 5 MG PO TABS: 5.0000 mg | ORAL_TABLET | Freq: Three times a day (TID) | ORAL | 0 refills | Status: DC | PRN

## 2022-07-31 MED ORDER — LORATADINE 10 MG PO TABS: 10.0000 mg | ORAL_TABLET | Freq: Every day | ORAL | 0 refills | Status: DC

## 2022-07-31 MED ORDER — CARVEDILOL 25 MG PO TABS: 25.0000 mg | ORAL_TABLET | Freq: Two times a day (BID) | ORAL | 1 refills | Status: DC

## 2022-07-31 MED ORDER — LOSARTAN POTASSIUM 100 MG PO TABS: 50.0000 mg | ORAL_TABLET | Freq: Two times a day (BID) | ORAL | 1 refills | Status: DC

## 2022-07-31 NOTE — Progress Notes (Signed)
Assessment & Plan:  Rebecca Joseph was seen today for hypertension.  Diagnoses and all orders for this visit:  Primary hypertension Try losartan 50 mg in the a.m. and 50 mg in the p.m. -     carvedilol (COREG) 25 MG tablet; Take 1 tablet (25 mg total) by mouth 2 (two) times daily with a meal. -     losartan (COZAAR) 100 MG tablet; Take 0.5 tablets (50 mg total) by mouth 2 (two) times daily. Continue all antihypertensives as prescribed.  Reminded to bring in blood pressure log for follow  up appointment.  RECOMMENDATIONS: DASH/Mediterranean Diets are healthier choices for HTN.    Chronic right-sided thoracic back pain Well-controlled -     cyclobenzaprine (FLEXERIL) 5 MG tablet; Take 1 tablet (5 mg total) by mouth 3 (three) times daily as needed for muscle spasms.  Seasonal allergies Stable -     loratadine (CLARITIN) 10 MG tablet; Take 1 tablet (10 mg total) by mouth daily.    Patient has been counseled on age-appropriate routine health concerns for screening and prevention. These are reviewed and up-to-date. Referrals have been placed accordingly. Immunizations are up-to-date or declined.    Subjective:   Chief Complaint  Patient presents with   Hypertension   HPI Rebecca Joseph 47 y.o. female presents to office today for follow up to HTN   Blood pressure at goal in Office today however she does report slightly increased blood pressure readings at home 130/90s.  She is currently taking carvedilol 25 mg twice daily and losartan 100 mg daily.  For better blood pressure control at home I recommend that she split her losartan in half and take 50 mg with the initial dose of carvedilol and then the other dose of 50 mg of losartan with her second p.m. dose of carvedilol BP Readings from Last 3 Encounters:  07/31/22 128/82  07/02/22 (!) 144/90  11/29/21 128/88    Patient has been counseled on age-appropriate routine health concerns for screening and prevention. These are  reviewed and up-to-date. Referrals have been placed accordingly. Immunizations are up-to-date or declined.     MAMMOGRAM: Overdue.  Scheduled May 14 Colonoscopy: Overdue.  Referral has been placed and she was given the phone number for the bowel or GI to schedule Pap smear: Up-to-date    Review of Systems  Constitutional:  Negative for fever, malaise/fatigue and weight loss.  HENT: Negative.  Negative for nosebleeds.   Eyes: Negative.  Negative for blurred vision, double vision and photophobia.  Respiratory: Negative.  Negative for cough and shortness of breath.   Cardiovascular: Negative.  Negative for chest pain, palpitations and leg swelling.  Gastrointestinal: Negative.  Negative for heartburn, nausea and vomiting.  Musculoskeletal: Negative.  Negative for myalgias.  Neurological: Negative.  Negative for dizziness, focal weakness, seizures and headaches.  Psychiatric/Behavioral: Negative.  Negative for suicidal ideas.     Past Medical History:  Diagnosis Date   Anemia 2018   iron deficiency, 2021 Hgb 6.3, most recent CBC as of 11/14/21,  Hgb 11.4 on 04/25/21 in Epic, iron infusions in 2021   Anxiety    Follows with Bertram Denver, NP, LOV 11/13/21.   COVID-19 11/24/2020   treated with anti-viral mediciation   GERD (gastroesophageal reflux disease)    occasional Tums or Rolaids   Gout    hx of gout   Headache    occasional   Hx of iron deficiency anemia    2018 & 2021, Hgb on 04/25/21 11.4  Hyperlipidemia    As of 11/14/21, pt is currently taking Lopid.   Hypertension    Pt follows with Bertram Denver, NP @ [facility], LOV (video) 11/13/21.   Overactive bladder 2023   w/ urinary incontinence   Uterine leiomyoma 2023   w/ menorrhagia   Vitamin D deficiency 2022   hx of, no longer takes supplements as of 11/21/21    Past Surgical History:  Procedure Laterality Date   CESAREAN SECTION  2004   HYSTERECTOMY ABDOMINAL WITH SALPINGECTOMY Bilateral 11/28/2021    Procedure: TOTAL ABDOMINAL HYSTERECTOMY WITH BILATERAL SALPINGECTOMY;  Surgeon: Lyn Henri, MD;  Location: [facility];  Service: Gynecology;  Laterality: Bilateral;   IR RADIOLOGIST EVAL & MGMT  07/12/2020   for fibroids, pt did not have procedure done   TUBAL LIGATION  2004    Family History  Problem Relation Age of Onset   Diabetes Father    Hypertension Father    Heart disease Father        CHF   Gout Father    Hypertension Mother    Breast cancer Maternal Aunt    Cancer Maternal Aunt    Breast cancer Cousin    Cancer Cousin        Maternal Cousin died from breast cancer    Social History Reviewed with no changes to be made today.   Outpatient Medications Prior to Visit  Medication Sig Dispense Refill   acetaminophen (TYLENOL) 325 MG tablet Take 2 tablets (650 mg total) by mouth every 4 (four) hours as needed for mild pain (temperature > 101.5.). 30 tablet 0   Ferrous Sulfate (IRON PO) Take by mouth. Blood builder     gemfibrozil (LOPID) 600 MG tablet Take 1 tablet (600 mg total) by mouth 2 (two) times daily before a meal. 60 tablet 0   hydrocortisone 2.5 % cream Apply topically 2 (two) times daily. 30 g 0   hydrOXYzine (ATARAX) 25 MG tablet Take 0.5-1 tablets (12.5-25 mg total) by mouth 3 (three) times daily as needed. 60 tablet 3   ibuprofen (ADVIL) 600 MG tablet Take 1 tablet (600 mg total) by mouth every 6 (six) hours as needed for mild pain or moderate pain. 30 tablet 0   carvedilol (COREG) 25 MG tablet Take 1 tablet (25 mg total) by mouth 2 (two) times daily with a meal. 60 tablet 0   cyclobenzaprine (FLEXERIL) 5 MG tablet Take 1 tablet (5 mg total) by mouth 3 (three) times daily as needed for muscle spasms. 30 tablet 0   loratadine (CLARITIN) 10 MG tablet Take 1 tablet (10 mg total) by mouth daily. 30 tablet 0   losartan (COZAAR) 100 MG tablet Take 1 tablet (100 mg total) by mouth daily. 90 tablet 1   No facility-administered medications prior to  visit.    Allergies  Allergen Reactions   Naproxen Shortness Of Breath and Other (See Comments)   Amlodipine Swelling    BLE EDEMA   Hctz [Hydrochlorothiazide] Other (See Comments)    GOUT       Objective:    BP 128/82 (BP Location: Left Arm, Patient Position: Sitting, Cuff Size: Normal)   Pulse 76   Ht  (1.651 m)   Wt 190 lb 12.8 oz (86.5 kg)   LMP 11/28/2021 (Approximate)   SpO2 100%   BMI 31.75 kg/m  Wt Readings from Last 3 Encounters:  07/31/22 190 lb 12.8 oz (86.5 kg)  07/02/22 188 lb (85.3  kg)  11/28/21 186 lb (84.4 kg)    Physical Exam Vitals and nursing note reviewed.  Constitutional:      Appearance: She is well-developed.  HENT:     Head: Normocephalic and atraumatic.  Cardiovascular:     Rate and Rhythm: Normal rate and regular rhythm.     Heart sounds: Normal heart sounds. No murmur heard.    No friction rub. No gallop.  Pulmonary:     Effort: Pulmonary effort is normal. No tachypnea or respiratory distress.     Breath sounds: Normal breath sounds. No decreased breath sounds, wheezing, rhonchi or rales.  Chest:     Chest wall: No tenderness.  Abdominal:     General: Bowel sounds are normal.     Palpations: Abdomen is soft.  Musculoskeletal:        General: Normal range of motion.     Cervical back: Normal range of motion.  Skin:    General: Skin is warm and dry.  Neurological:     Mental Status: She is alert and oriented to person, place, and time.     Coordination: Coordination normal.  Psychiatric:        Behavior: Behavior normal. Behavior is cooperative.        Thought Content: Thought content normal.        Judgment: Judgment normal.          Patient has been counseled extensively about nutrition and exercise as well as the importance of adherence with medications and regular follow-up. The patient was given clear instructions to go to ER or return to medical center if symptoms don't improve, worsen or new problems develop. The  patient verbalized understanding.   Follow-up: Return in about 3 months (around 10/30/2022).   Claiborne Rigg, FNP-BC [facility] [place], [place] [phone]   07/31/2022, 12:43 PM

## 2022-08-05 ENCOUNTER — Encounter: Payer: Self-pay | Admitting: Nurse Practitioner

## 2022-08-09 ENCOUNTER — Encounter: Payer: Self-pay | Admitting: Nurse Practitioner

## 2022-08-15 ENCOUNTER — Encounter: Payer: Self-pay | Admitting: Gastroenterology

## 2022-08-19 ENCOUNTER — Other Ambulatory Visit: Payer: Self-pay | Admitting: Family Medicine

## 2022-08-19 DIAGNOSIS — I10 Essential (primary) hypertension: Secondary | ICD-10-CM

## 2022-08-20 ENCOUNTER — Ambulatory Visit: Payer: PRIVATE HEALTH INSURANCE

## 2022-08-29 ENCOUNTER — Other Ambulatory Visit: Payer: Self-pay | Admitting: Nurse Practitioner

## 2022-08-29 DIAGNOSIS — J302 Other seasonal allergic rhinitis: Secondary | ICD-10-CM

## 2022-09-17 ENCOUNTER — Ambulatory Visit (AMBULATORY_SURGERY_CENTER): Payer: PRIVATE HEALTH INSURANCE

## 2022-09-17 VITALS — Ht 65.0 in | Wt 190.0 lb

## 2022-09-17 DIAGNOSIS — Z86010 Personal history of colonic polyps: Secondary | ICD-10-CM

## 2022-09-17 MED ORDER — PEG 3350-KCL-NA BICARB-NACL 420 G PO SOLR
4000.0000 mL | Freq: Once | ORAL | 0 refills | Status: AC
Start: 2022-09-17 — End: 2022-09-17

## 2022-09-17 NOTE — Progress Notes (Signed)
No egg or soy allergy known to patient  No issues known to pt with past sedation with any surgeries or procedures Patient denies ever being told they had issues or difficulty with intubation  No FH of Malignant Hyperthermia Pt is not on diet pills Pt is not on  home 02  Pt is not on blood thinners  Pt denies issues with constipation  No A fib or A flutter Have any cardiac testing pending--no   PV complete. Prep instructions reviewed and sent to patient via mychart / home address.   Patient's chart reviewed by Cathlyn Parsons CNRA prior to previsit and patient appropriate for the [facility].  Previsit completed and red dot placed by patient's name on their procedure day (on provider's schedule).

## 2022-10-02 ENCOUNTER — Encounter: Payer: PRIVATE HEALTH INSURANCE | Admitting: Gastroenterology

## 2022-10-09 ENCOUNTER — Other Ambulatory Visit: Payer: Self-pay | Admitting: Nurse Practitioner

## 2022-10-09 ENCOUNTER — Encounter: Payer: Self-pay | Admitting: Nurse Practitioner

## 2022-10-09 ENCOUNTER — Other Ambulatory Visit: Payer: Self-pay

## 2022-10-09 DIAGNOSIS — G8929 Other chronic pain: Secondary | ICD-10-CM

## 2022-10-09 DIAGNOSIS — F419 Anxiety disorder, unspecified: Secondary | ICD-10-CM

## 2022-10-09 DIAGNOSIS — E783 Hyperchylomicronemia: Secondary | ICD-10-CM

## 2022-10-09 DIAGNOSIS — Z1211 Encounter for screening for malignant neoplasm of colon: Secondary | ICD-10-CM

## 2022-10-09 MED ORDER — GOLYTELY 236 G PO SOLR: 4000.0000 mL | Freq: Once | ORAL | 0 refills | Status: AC

## 2022-10-09 MED ORDER — HYDROXYZINE HCL 25 MG PO TABS
12.5000 mg | ORAL_TABLET | Freq: Three times a day (TID) | ORAL | 3 refills | Status: DC | PRN
Start: 2022-10-09 — End: 2023-09-05
  Filled 2022-10-09: qty 60, 20d supply, fill #0
  Filled 2023-06-27: qty 60, 20d supply, fill #1

## 2022-10-09 MED ORDER — GEMFIBROZIL 600 MG PO TABS
600.0000 mg | ORAL_TABLET | Freq: Two times a day (BID) | ORAL | 1 refills | Status: DC
Start: 2022-10-09 — End: 2023-06-27
  Filled 2022-10-09: qty 180, 90d supply, fill #0
  Filled 2023-02-13: qty 180, 90d supply, fill #1

## 2022-10-09 MED ORDER — CYCLOBENZAPRINE HCL 5 MG PO TABS
5.0000 mg | ORAL_TABLET | Freq: Three times a day (TID) | ORAL | 1 refills | Status: DC | PRN
Start: 2022-10-09 — End: 2023-09-05
  Filled 2022-10-09: qty 30, 10d supply, fill #0
  Filled 2023-06-27: qty 30, 10d supply, fill #1

## 2022-10-22 ENCOUNTER — Encounter: Payer: Self-pay | Admitting: Nurse Practitioner

## 2022-10-22 ENCOUNTER — Encounter: Payer: PRIVATE HEALTH INSURANCE | Admitting: Gastroenterology

## 2022-10-22 ENCOUNTER — Other Ambulatory Visit: Payer: Self-pay | Admitting: Nurse Practitioner

## 2022-11-13 ENCOUNTER — Ambulatory Visit: Payer: PRIVATE HEALTH INSURANCE | Admitting: Nurse Practitioner

## 2022-12-03 ENCOUNTER — Other Ambulatory Visit: Payer: Self-pay | Admitting: Family Medicine

## 2022-12-03 DIAGNOSIS — J302 Other seasonal allergic rhinitis: Secondary | ICD-10-CM

## 2022-12-19 ENCOUNTER — Encounter: Payer: Self-pay | Admitting: Nurse Practitioner

## 2022-12-23 ENCOUNTER — Telehealth: Payer: PRIVATE HEALTH INSURANCE | Admitting: Internal Medicine

## 2022-12-23 ENCOUNTER — Telehealth: Payer: PRIVATE HEALTH INSURANCE | Admitting: Nurse Practitioner

## 2022-12-24 ENCOUNTER — Other Ambulatory Visit: Payer: Self-pay | Admitting: Nurse Practitioner

## 2022-12-24 DIAGNOSIS — M109 Gout, unspecified: Secondary | ICD-10-CM

## 2022-12-24 MED ORDER — PREDNISONE 20 MG PO TABS
20.0000 mg | ORAL_TABLET | Freq: Every day | ORAL | 0 refills | Status: AC
Start: 2022-12-24 — End: 2022-12-31

## 2023-01-14 ENCOUNTER — Ambulatory Visit: Payer: PRIVATE HEALTH INSURANCE | Attending: Nurse Practitioner | Admitting: Nurse Practitioner

## 2023-01-14 ENCOUNTER — Encounter: Payer: Self-pay | Admitting: Nurse Practitioner

## 2023-01-14 VITALS — BP 130/85 | HR 80 | Wt 181.0 lb

## 2023-01-14 DIAGNOSIS — Z23 Encounter for immunization: Secondary | ICD-10-CM | POA: Diagnosis not present

## 2023-01-14 DIAGNOSIS — I10 Essential (primary) hypertension: Secondary | ICD-10-CM | POA: Diagnosis not present

## 2023-01-14 DIAGNOSIS — D649 Anemia, unspecified: Secondary | ICD-10-CM

## 2023-01-14 NOTE — Progress Notes (Signed)
Assessment & Plan:  Rebecca "Marisue Ivan" was seen today for medical management of chronic issues.  Diagnoses and all orders for this visit:  Primary hypertension -     CMP14+EGFR  Anemia, unspecified type -     CBC with Differential  Encounter for immunization -     Flu vaccine trivalent PF, 6mos and older(Flulaval,Afluria,Fluarix,Fluzone)    Patient has been counseled on age-appropriate routine health concerns for screening and prevention. These are reviewed and up-to-date. Referrals have been placed accordingly. Immunizations are up-to-date or declined.    Subjective:   Chief Complaint  Patient presents with   Medical Management of Chronic Issues   HPI Rebecca Joseph 47 y.o. female presents to office today for follow up to HTN.   She has a past medical history of Anemia (2018), Anxiety, COVID-19 (11/24/2020), GERD, Gout, Headache, iron deficiency anemia, Hyperlipidemia, Hypertension, Overactive bladder (2023), Uterine leiomyoma (2023), and Vitamin D deficiency (2022).   Patient has been counseled on age-appropriate routine health concerns for screening and prevention. These are reviewed and up-to-date. Referrals have been placed accordingly. Immunizations are up-to-date or declined.     MAMMOGRAM: Overdue.  Colon Cancer Screening: Overdue PAP Smear: UTD   HTN She is taking losartan 50 mg BID,  carvedilol 25 mg BID. Blood pressure is well controlled.  BP Readings from Last 3 Encounters:  01/14/23 130/85  07/31/22 128/82  07/02/22 (!) 144/90    Review of Systems  Constitutional:  Negative for fever, malaise/fatigue and weight loss.  HENT: Negative.  Negative for nosebleeds.   Eyes: Negative.  Negative for blurred vision, double vision and photophobia.  Respiratory: Negative.  Negative for cough and shortness of breath.   Cardiovascular: Negative.  Negative for chest pain, palpitations and leg swelling.  Gastrointestinal: Negative.  Negative for heartburn,  nausea and vomiting.  Musculoskeletal: Negative.  Negative for myalgias.  Neurological: Negative.  Negative for dizziness, focal weakness, seizures and headaches.  Psychiatric/Behavioral: Negative.  Negative for suicidal ideas.     Past Medical History:  Diagnosis Date   Anemia 2018   iron deficiency, 2021 Hgb 6.3, most recent CBC as of 11/14/21,  Hgb 11.4 on 04/25/21 in Epic, iron infusions in 2021   Anxiety    Follows with Bertram Denver, NP, LOV 11/13/21.   COVID-19 11/24/2020   treated with anti-viral mediciation   GERD (gastroesophageal reflux disease)    occasional Tums or Rolaids   Gout    hx of gout   Headache    occasional   Hx of iron deficiency anemia    2018 & 2021, Hgb on 04/25/21 11.4   Hyperlipidemia    As of 11/14/21, pt is currently taking Lopid.   Hypertension    Pt follows with Bertram Denver, NP @ [facility], LOV (video) 11/13/21.   Overactive bladder 2023   w/ urinary incontinence   Uterine leiomyoma 2023   w/ menorrhagia   Vitamin D deficiency 2022   hx of, no longer takes supplements as of 11/21/21    Past Surgical History:  Procedure Laterality Date   CESAREAN SECTION  2004   HYSTERECTOMY ABDOMINAL WITH SALPINGECTOMY Bilateral 11/28/2021   Procedure: TOTAL ABDOMINAL HYSTERECTOMY WITH BILATERAL SALPINGECTOMY;  Surgeon: Lyn Henri, MD;  Location: [facility];  Service: Gynecology;  Laterality: Bilateral;   IR RADIOLOGIST EVAL & MGMT  07/12/2020   for fibroids, pt did not have procedure done   TUBAL LIGATION  2004    Family History  Problem Relation Age of Onset   Hypertension Mother    Diabetes Father    Hypertension Father    Heart disease Father        CHF   Gout Father    Breast cancer Maternal Aunt    Cancer Maternal Aunt    Breast cancer Cousin    Cancer Cousin        Maternal Cousin died from breast cancer   Colon cancer Neg Hx    Rectal cancer Neg Hx    Stomach cancer Neg Hx     Social History  Reviewed with no changes to be made today.   Outpatient Medications Prior to Visit  Medication Sig Dispense Refill   carvedilol (COREG) 25 MG tablet Take 1 tablet (25 mg total) by mouth 2 (two) times daily with a meal. 180 tablet 1   cyclobenzaprine (FLEXERIL) 5 MG tablet Take 1 tablet (5 mg total) by mouth 3 (three) times daily as needed for muscle spasms. 30 tablet 1   gemfibrozil (LOPID) 600 MG tablet Take 1 tablet (600 mg total) by mouth 2 (two) times daily before a meal. For cholesterol 180 tablet 1   hydrOXYzine (ATARAX) 25 MG tablet Take 0.5-1 tablets (12.5-25 mg total) by mouth 3 (three) times daily as needed. For anxiety 60 tablet 3   losartan (COZAAR) 100 MG tablet Take 0.5 tablets (50 mg total) by mouth 2 (two) times daily. 90 tablet 1   acetaminophen (TYLENOL) 325 MG tablet Take 2 tablets (650 mg total) by mouth every 4 (four) hours as needed for mild pain (temperature > 101.5.). 30 tablet 0   ibuprofen (ADVIL) 600 MG tablet Take 1 tablet (600 mg total) by mouth every 6 (six) hours as needed for mild pain or moderate pain. 30 tablet 0   EQ ALL DAY ALLERGY RELIEF 10 MG tablet Take 1 tablet by mouth once daily (Patient not taking: Reported on 01/14/2023) 90 tablet 0   Ferrous Sulfate (IRON PO) Take by mouth. Blood builder (Patient not taking: Reported on 09/17/2022)     hydrocortisone 2.5 % cream Apply topically 2 (two) times daily. (Patient not taking: Reported on 09/17/2022) 30 g 0   No facility-administered medications prior to visit.    Allergies  Allergen Reactions   Naproxen Shortness Of Breath and Other (See Comments)   Amlodipine Swelling    BLE EDEMA   Hctz [Hydrochlorothiazide] Other (See Comments)    GOUT       Objective:    BP 130/85   Pulse 80   Wt 181 lb (82.1 kg)   LMP 11/28/2021 (Approximate)   SpO2 99%   BMI 30.12 kg/m  Wt Readings from Last 3 Encounters:  01/14/23 181 lb (82.1 kg)  09/17/22 190 lb (86.2 kg)  07/31/22 190 lb 12.8 oz (86.5 kg)     Physical Exam Vitals and nursing note reviewed.  Constitutional:      Appearance: She is well-developed.  HENT:     Head: Normocephalic and atraumatic.  Cardiovascular:     Rate and Rhythm: Normal rate and regular rhythm.     Heart sounds: Normal heart sounds. No murmur heard.    No friction rub. No gallop.  Pulmonary:     Effort: Pulmonary effort is normal. No tachypnea or respiratory distress.     Breath sounds: Normal breath sounds. No decreased breath sounds, wheezing, rhonchi or rales.  Chest:     Chest wall: No tenderness.  Abdominal:     General: Bowel  sounds are normal.     Palpations: Abdomen is soft.  Musculoskeletal:        General: Normal range of motion.     Cervical back: Normal range of motion.  Skin:    General: Skin is warm and dry.  Neurological:     Mental Status: She is alert and oriented to person, place, and time.     Coordination: Coordination normal.  Psychiatric:        Behavior: Behavior normal. Behavior is cooperative.        Thought Content: Thought content normal.        Judgment: Judgment normal.          Patient has been counseled extensively about nutrition and exercise as well as the importance of adherence with medications and regular follow-up. The patient was given clear instructions to go to ER or return to medical center if symptoms don't improve, worsen or new problems develop. The patient verbalized understanding.   Follow-up: Return in about 3 months (around 04/16/2023).   Claiborne Rigg, FNP-BC [facility] [place], [place] [phone]   01/14/2023, 10:17 PM

## 2023-01-15 ENCOUNTER — Other Ambulatory Visit: Payer: Self-pay | Admitting: Nurse Practitioner

## 2023-01-15 DIAGNOSIS — I10 Essential (primary) hypertension: Secondary | ICD-10-CM

## 2023-01-15 LAB — CMP14+EGFR
ALT: 9 [IU]/L (ref 0–32)
AST: 18 [IU]/L (ref 0–40)
Albumin: 3.9 g/dL (ref 3.9–4.9)
Alkaline Phosphatase: 131 [IU]/L — ABNORMAL HIGH (ref 44–121)
BUN/Creatinine Ratio: 20 (ref 9–23)
BUN: 17 mg/dL (ref 6–24)
Bilirubin Total: 0.5 mg/dL (ref 0.0–1.2)
CO2: 20 mmol/L (ref 20–29)
Calcium: 9.4 mg/dL (ref 8.7–10.2)
Chloride: 103 mmol/L (ref 96–106)
Creatinine, Ser: 0.87 mg/dL (ref 0.57–1.00)
Globulin, Total: 3.1 g/dL (ref 1.5–4.5)
Glucose: 87 mg/dL (ref 70–99)
Potassium: 4.3 mmol/L (ref 3.5–5.2)
Sodium: 137 mmol/L (ref 134–144)
Total Protein: 7 g/dL (ref 6.0–8.5)
eGFR: 83 mL/min/{1.73_m2} (ref >59)

## 2023-01-15 LAB — CBC WITH DIFFERENTIAL/PLATELET
Basophils Absolute: 0 10*3/uL (ref 0.0–0.2)
Basos: 1 %
EOS (ABSOLUTE): 0.2 10*3/uL (ref 0.0–0.4)
Eos: 3 %
Hematocrit: 34.2 % (ref 34.0–46.6)
Hemoglobin: 11.3 g/dL (ref 11.1–15.9)
Immature Grans (Abs): 0 10*3/uL (ref 0.0–0.1)
Immature Granulocytes: 0 %
Lymphocytes Absolute: 1.2 10*3/uL (ref 0.7–3.1)
Lymphs: 19 %
MCH: 27.9 pg (ref 26.6–33.0)
MCHC: 33 g/dL (ref 31.5–35.7)
MCV: 84 fL (ref 79–97)
Monocytes Absolute: 0.4 10*3/uL (ref 0.1–0.9)
Monocytes: 7 %
Neutrophils Absolute: 4.3 10*3/uL (ref 1.4–7.0)
Neutrophils: 70 %
Platelets: 220 10*3/uL (ref 150–450)
RBC: 4.05 x10E6/uL (ref 3.77–5.28)
RDW: 14.4 % (ref 11.7–15.4)
WBC: 6.2 10*3/uL (ref 3.4–10.8)

## 2023-02-13 ENCOUNTER — Other Ambulatory Visit: Payer: Self-pay

## 2023-04-09 ENCOUNTER — Encounter: Payer: Self-pay | Admitting: Nurse Practitioner

## 2023-04-11 ENCOUNTER — Telehealth: Payer: Medicaid Other | Admitting: Family Medicine

## 2023-04-11 DIAGNOSIS — R112 Nausea with vomiting, unspecified: Secondary | ICD-10-CM

## 2023-04-11 MED ORDER — ONDANSETRON 4 MG PO TBDP
4.0000 mg | ORAL_TABLET | Freq: Three times a day (TID) | ORAL | 0 refills | Status: DC | PRN
Start: 2023-04-11 — End: 2023-06-11

## 2023-04-11 NOTE — Progress Notes (Signed)
 E-Visit for Nausea and Vomiting   We are sorry that you are not feeling well. Here is how we plan to help!  Based on what you have shared with me it looks like you have a Virus that is irritating your GI tract.  Vomiting is the forceful emptying of a portion of the stomach's content through the mouth.  Although nausea and vomiting can make you feel miserable, it's important to remember that these are not diseases, but rather symptoms of an underlying illness.  When we treat short term symptoms, we always caution that any symptoms that persist should be fully evaluated in a medical office.  I have prescribed a medication that will help alleviate your symptoms and allow you to stay hydrated:  Zofran 4 mg 1 tablet every 8 hours as needed for nausea and vomiting  HOME CARE: Drink clear liquids.  This is very important! Dehydration (the lack of fluid) can lead to a serious complication.  Start off with 1 tablespoon every 5 minutes for 8 hours. You may begin eating bland foods after 8 hours without vomiting.  Start with saltine crackers, white bread, rice, mashed potatoes, applesauce. After 48 hours on a bland diet, you may resume a normal diet. Try to go to sleep.  Sleep often empties the stomach and relieves the need to vomit.  GET HELP RIGHT AWAY IF:  Your symptoms do not improve or worsen within 2 days after treatment. You have a fever for over 3 days. You cannot keep down fluids after trying the medication.  MAKE SURE YOU:  Understand these instructions. Will watch your condition. Will get help right away if you are not doing well or get worse.    Thank you for choosing an e-visit.  Your e-visit answers were reviewed by a board certified advanced clinical practitioner to complete your personal care plan. Depending upon the condition, your plan could have included both over the counter or prescription medications.  Please review your pharmacy choice. Make sure the pharmacy is open so  you can pick up prescription now. If there is a problem, you may contact your provider through [employer] and have the prescription routed to another pharmacy.  Your safety is important to [place]. If you have drug allergies check your prescription carefully.   For the next 24 hours you can use MyChart to ask questions about today's visit, request a non-urgent call back, or ask for a work or school excuse. You will get an email in the next two days asking about your experience. I hope that your e-visit has been valuable and will speed your recovery.  I provided 5 minutes of non face-to-face time during this encounter for chart review, medication and order placement, as well as and documentation.

## 2023-04-30 ENCOUNTER — Other Ambulatory Visit: Payer: Self-pay

## 2023-04-30 ENCOUNTER — Encounter: Payer: Self-pay | Admitting: Nurse Practitioner

## 2023-04-30 DIAGNOSIS — I10 Essential (primary) hypertension: Secondary | ICD-10-CM

## 2023-04-30 MED ORDER — LOSARTAN POTASSIUM 100 MG PO TABS: 50.0000 mg | ORAL_TABLET | Freq: Two times a day (BID) | ORAL | 0 refills | Status: DC

## 2023-06-04 ENCOUNTER — Encounter: Payer: Self-pay | Admitting: Nurse Practitioner

## 2023-06-11 ENCOUNTER — Telehealth: Payer: PRIVATE HEALTH INSURANCE | Admitting: Physician Assistant

## 2023-06-11 DIAGNOSIS — J101 Influenza due to other identified influenza virus with other respiratory manifestations: Secondary | ICD-10-CM | POA: Diagnosis not present

## 2023-06-11 MED ORDER — FLUTICASONE PROPIONATE 50 MCG/ACT NA SUSP
2.0000 | Freq: Every day | NASAL | 0 refills | Status: AC
Start: 2023-06-11 — End: ?

## 2023-06-11 MED ORDER — BENZONATATE 100 MG PO CAPS
100.0000 mg | ORAL_CAPSULE | Freq: Three times a day (TID) | ORAL | 0 refills | Status: DC | PRN
Start: 2023-06-11 — End: 2023-08-25

## 2023-06-11 MED ORDER — PROMETHAZINE-DM 6.25-15 MG/5ML PO SYRP
5.0000 mL | ORAL_SOLUTION | Freq: Four times a day (QID) | ORAL | 0 refills | Status: DC | PRN
Start: 2023-06-11 — End: 2023-08-28

## 2023-06-11 NOTE — Progress Notes (Signed)
 E visit for Flu like symptoms   We are sorry that you are not feeling well.  Here is how we plan to help! Based on what you have shared with me it looks like you may have flu-like symptoms that should be watched but do not seem to indicate anti-viral treatment.  Influenza or "the flu" is   an infection caused by a respiratory virus. The flu virus is highly contagious and persons who did not receive their yearly flu vaccination may "catch" the flu from close contact.  We have anti-viral medications to treat the viruses that cause this infection. They are not a "cure" and only shorten the course of the infection. These prescriptions are most effective when they are given within the first 2 days of "flu" symptoms.   Based upon your symptoms and potential risk factors I recommend that you follow the flu symptoms recommendation that I have listed below.  Isolation Instructions:   You are to isolate at home until you have been fever free for at least 24 hours without a fever-reducing medication, and symptoms have been steadily improving for 24 hours. At that time,  you can end isolation but need to mask for an additional 5 days.  Go to the nearest hospital ED for assessment if fever/cough/breathlessness are severe or illness seems like a threat to life.   You can use medication such as prescription cough medication called Tessalon Perles 100 mg. You may take 1-2 capsules every 8 hours as needed for cough, prescription cough medication called Phenergan DM 6.25 mg/15 mg. You make take one teaspoon / 5 ml every 4-6 hours as needed for cough, and prescription for Fluticasone nasal spray 2 sprays in each nostril one time per day  You may also take acetaminophen (Tylenol) as needed for fever.  HOME CARE Only take medications as instructed by your medical team. Drink plenty of fluids and get plenty of rest. A steam or ultrasonic humidifier can help if you have congestion.  GET HELP RIGHT AWAY IF YOU HAVE  EMERGENCY WARNING SIGNS.  Call 911 or proceed to your closest emergency facility if: You develop worsening high fever. Trouble breathing Bluish lips or face Persistent pain or pressure in the chest New confusion Inability to wake or stay awake You cough up blood. Your symptoms become more severe Inability to hold down food or fluids  ANYONE WHO HAS FLU SYMPTOMS SHOULD: Stay home. The flu is highly contagious and going out or to work exposes others! Be sure to drink plenty of fluids. Water is fine as well as fruit juices, sodas and electrolyte beverages. You may want to stay away from caffeine or alcohol. If you are nauseated, try taking small sips of liquids. How do you know if you are getting enough fluid? Your urine should be a pale yellow or almost colorless. Get rest. Taking a steamy shower or using a humidifier may help nasal congestion and ease sore throat pain. Using a saline nasal spray works much the same way. Cough drops, hard candies and sore throat lozenges may ease your cough. Line up a caregiver. Have someone check on you regularly.   GET HELP RIGHT AWAY IF: You cannot keep down liquids or your medications. You become short of breath Your fell like you are going to pass out or loose consciousness. Your symptoms persist after you have completed your treatment plan MAKE SURE YOU  Understand these instructions. Will watch your condition. Will get help right away if you are not  doing well or get worse.  Your e-visit answers were reviewed by a board certified advanced clinical practitioner to complete your personal care plan.  Depending on the condition, your plan could have included both over the counter or prescription medications.  If there is a problem please reply  once you have received a response from your provider.  Your safety is important to [place].  If you have drug allergies check your prescription carefully.    You can use MyChart to ask questions about today's  visit, request a non-urgent call back, or ask for a work or school excuse for 24 hours related to this e-Visit. If it has been greater than 24 hours you will need to follow up with your provider, or enter a new e-Visit to address those concerns.  You will get an e-mail in the next two days asking about your experience.  I hope that your e-visit has been valuable and will speed your recovery. Thank you for using e-visits.   I have spent 5 minutes in review of e-visit questionnaire, review and updating patient chart, medical decision making and response to patient.   Margaretann Loveless, PA-C

## 2023-06-27 ENCOUNTER — Other Ambulatory Visit: Payer: Self-pay | Admitting: Nurse Practitioner

## 2023-06-27 DIAGNOSIS — E783 Hyperchylomicronemia: Secondary | ICD-10-CM

## 2023-06-30 ENCOUNTER — Other Ambulatory Visit: Payer: Self-pay

## 2023-06-30 MED ORDER — GEMFIBROZIL 600 MG PO TABS
600.0000 mg | ORAL_TABLET | Freq: Two times a day (BID) | ORAL | 0 refills | Status: DC
  Filled 2023-06-30: qty 60, 30d supply, fill #0

## 2023-07-07 ENCOUNTER — Other Ambulatory Visit: Payer: Self-pay

## 2023-08-01 ENCOUNTER — Other Ambulatory Visit: Payer: Self-pay | Admitting: Nurse Practitioner

## 2023-08-01 ENCOUNTER — Other Ambulatory Visit: Payer: Self-pay | Admitting: Family Medicine

## 2023-08-01 DIAGNOSIS — I10 Essential (primary) hypertension: Secondary | ICD-10-CM

## 2023-08-01 DIAGNOSIS — E783 Hyperchylomicronemia: Secondary | ICD-10-CM

## 2023-08-01 MED ORDER — LOSARTAN POTASSIUM 100 MG PO TABS
50.0000 mg | ORAL_TABLET | Freq: Two times a day (BID) | ORAL | 0 refills | Status: DC
Start: 2023-08-01 — End: 2023-09-05

## 2023-08-04 ENCOUNTER — Other Ambulatory Visit: Payer: Self-pay

## 2023-08-04 MED ORDER — GEMFIBROZIL 600 MG PO TABS
600.0000 mg | ORAL_TABLET | Freq: Two times a day (BID) | ORAL | 0 refills | Status: DC
  Filled 2023-08-04: qty 180, 90d supply, fill #0

## 2023-08-04 NOTE — Telephone Encounter (Signed)
 Requested Prescriptions  Pending Prescriptions Disp Refills   gemfibrozil  (LOPID ) 600 MG tablet 180 tablet 0    Sig: Take 1 tablet (600 mg total) by mouth 2 (two) times daily before a meal. For cholesterol     Cardiovascular:  Antilipid - Fibric Acid Derivatives Failed - 08/04/2023  8:56 AM      Failed - Lipid Panel in normal range within the last 12 months    Cholesterol, Total  Date Value Ref Range Status  07/24/2020 148 100 - 199 mg/dL Final   LDL Chol Calc (NIH)  Date Value Ref Range Status  07/24/2020 76 0 - 99 mg/dL Final   HDL  Date Value Ref Range Status  07/24/2020 39 (L) >39 mg/dL Final   Triglycerides  Date Value Ref Range Status  07/24/2020 195 (H) 0 - 149 mg/dL Final         Passed - ALT in normal range and within 360 days    ALT  Date Value Ref Range Status  01/14/2023 9 0 - 32 IU/L Final         Passed - AST in normal range and within 360 days    AST  Date Value Ref Range Status  01/14/2023 18 0 - 40 IU/L Final         Passed - Cr in normal range and within 360 days    Creatinine, Ser  Date Value Ref Range Status  01/14/2023 0.87 0.57 - 1.00 mg/dL Final         Passed - HGB in normal range and within 360 days    Hemoglobin  Date Value Ref Range Status  01/14/2023 11.3 11.1 - 15.9 g/dL Final         Passed - HCT in normal range and within 360 days    Hematocrit  Date Value Ref Range Status  01/14/2023 34.2 34.0 - 46.6 % Final         Passed - PLT in normal range and within 360 days    Platelets  Date Value Ref Range Status  01/14/2023 220 150 - 450 x10E3/uL Final         Passed - WBC in normal range and within 360 days    WBC  Date Value Ref Range Status  01/14/2023 6.2 3.4 - 10.8 x10E3/uL Final  11/26/2021 8.1 4.0 - 10.5 K/uL Final         Passed - eGFR is 30 or above and within 360 days    GFR calc Af Amer  Date Value Ref Range Status  02/09/2020 125 >59 mL/min/1.73 Final    Comment:    **In accordance with recommendations from  the NKF-ASN Task force,**   [facility] is in the process of updating its eGFR calculation to the   2021 CKD-EPI creatinine equation that estimates kidney function   without a race variable.    GFR, Estimated  Date Value Ref Range Status  11/26/2021 >60 >60 mL/min Final    Comment:    (NOTE) Calculated using the CKD-EPI Creatinine Equation (2021)    eGFR  Date Value Ref Range Status  01/14/2023 83 >59 mL/min/1.73 Final         Passed - Valid encounter within last 12 months    Recent Outpatient Visits           6 months ago Primary hypertension   [facility] [place]. [facility] Collins Dean, NP   1 year  ago Primary hypertension   [facility] [place]. [facility] Collins Dean, NP   1 year ago Primary hypertension   [facility] [place]. [facility] Collins Dean, NP   1 year ago Essential hypertension   [facility] [place] Collins Dean, NP   2 years ago Essential hypertension    [facility] [place]. [facility] Collins Dean, [place]

## 2023-08-06 ENCOUNTER — Telehealth: Payer: PRIVATE HEALTH INSURANCE | Admitting: Nurse Practitioner

## 2023-08-06 DIAGNOSIS — J0100 Acute maxillary sinusitis, unspecified: Secondary | ICD-10-CM

## 2023-08-06 MED ORDER — AMOXICILLIN-POT CLAVULANATE 875-125 MG PO TABS: 1.0000 | ORAL_TABLET | Freq: Two times a day (BID) | ORAL | 0 refills | Status: AC

## 2023-08-06 NOTE — Progress Notes (Signed)
 E-Visit for Sinus Problems  We are sorry that you are not feeling well.  Here is how we plan to help!  Based on what you have shared with me it looks like you have sinusitis.  Sinusitis is inflammation and infection in the sinus cavities of the head.  Based on your presentation I believe you most likely have Acute Bacterial Sinusitis.  This is an infection caused by bacteria and is treated with antibiotics. I have prescribed Augmentin 875mg /125mg  one tablet twice daily with food, for 7 days. You may use an oral decongestant such as Mucinex D or if you have glaucoma or high blood pressure use plain Mucinex. Saline nasal spray help and can safely be used as often as needed for congestion.  If you develop worsening sinus pain, fever or notice severe headache and vision changes, or if symptoms are not better after completion of antibiotic, please schedule an appointment with a health care provider.    Sinus infections are not as easily transmitted as other respiratory infection, however we still recommend that you avoid close contact with loved ones, especially the very young and elderly.  Remember to wash your hands thoroughly throughout the day as this is the number one way to prevent the spread of infection!  Home Care: Only take medications as instructed by your medical team. Complete the entire course of an antibiotic. Do not take these medications with alcohol. A steam or ultrasonic humidifier can help congestion.  You can place a towel over your head and breathe in the steam from hot water coming from a faucet. Avoid close contacts especially the very young and the elderly. Cover your mouth when you cough or sneeze. Always remember to wash your hands.  Get Help Right Away If: You develop worsening fever or sinus pain. You develop a severe head ache or visual changes. Your symptoms persist after you have completed your treatment plan.  Make sure you Understand these instructions. Will watch  your condition. Will get help right away if you are not doing well or get worse.  Thank you for choosing an e-visit.  Your e-visit answers were reviewed by a board certified advanced clinical practitioner to complete your personal care plan. Depending upon the condition, your plan could have included both over the counter or prescription medications.  Please review your pharmacy choice. Make sure the pharmacy is open so you can pick up prescription now. If there is a problem, you may contact your provider through [employer] and have the prescription routed to another pharmacy.  Your safety is important to [place]. If you have drug allergies check your prescription carefully.   For the next 24 hours you can use MyChart to ask questions about today's visit, request a non-urgent call back, or ask for a work or school excuse. You will get an email in the next two days asking about your experience. I hope that your e-visit has been valuable and will speed your recovery.   I spent approximately 5 minutes reviewing the patient's history, current symptoms and coordinating their care today.

## 2023-08-07 ENCOUNTER — Other Ambulatory Visit: Payer: Self-pay

## 2023-08-12 ENCOUNTER — Encounter: Payer: Self-pay | Admitting: Nurse Practitioner

## 2023-08-20 ENCOUNTER — Other Ambulatory Visit: Payer: Self-pay

## 2023-08-20 ENCOUNTER — Encounter: Payer: Self-pay | Admitting: Nurse Practitioner

## 2023-08-20 DIAGNOSIS — I10 Essential (primary) hypertension: Secondary | ICD-10-CM

## 2023-08-20 MED ORDER — CARVEDILOL 25 MG PO TABS: 25.0000 mg | ORAL_TABLET | Freq: Two times a day (BID) | ORAL | 0 refills | Status: DC

## 2023-08-25 ENCOUNTER — Telehealth: Payer: PRIVATE HEALTH INSURANCE | Admitting: Physician Assistant

## 2023-08-25 DIAGNOSIS — J069 Acute upper respiratory infection, unspecified: Secondary | ICD-10-CM

## 2023-08-25 MED ORDER — AZELASTINE HCL 0.1 % NA SOLN: 1.0000 | Freq: Two times a day (BID) | NASAL | 0 refills | Status: AC

## 2023-08-25 MED ORDER — BENZONATATE 100 MG PO CAPS
100.0000 mg | ORAL_CAPSULE | Freq: Three times a day (TID) | ORAL | 0 refills | Status: DC | PRN
Start: 2023-08-25 — End: 2023-09-05

## 2023-08-25 NOTE — Progress Notes (Signed)
 E-Visit for Upper Respiratory Infection   We are sorry you are not feeling well.  Here is how we plan to help!  Based on what you have shared with me, it looks like you may have a viral upper respiratory infection.  Upper respiratory infections are caused by a large number of viruses; however, rhinovirus is the most common cause.   Symptoms vary from person to person, with common symptoms including sore throat, cough, fatigue or lack of energy and feeling of general discomfort.  A low-grade fever of up to 100.4 may present, but is often uncommon.  Symptoms vary however, and are closely related to a person's age or underlying illnesses.  The most common symptoms associated with an upper respiratory infection are nasal discharge or congestion, cough, sneezing, headache and pressure in the ears and face.  These symptoms usually persist for about 3 to 10 days, but can last up to 2 weeks.  It is important to know that upper respiratory infections do not cause serious illness or complications in most cases.    Upper respiratory infections can be transmitted from person to person, with the most common method of transmission being a person's hands.  The virus is able to live on the skin and can infect other persons for up to 2 hours after direct contact.  Also, these can be transmitted when someone coughs or sneezes; thus, it is important to cover the mouth to reduce this risk.  To keep the spread of the illness at bay, good hand hygiene is very important.  This is an infection that is most likely caused by a virus. There are no specific treatments other than to help you with the symptoms until the infection runs its course.  We are sorry you are not feeling well.  Here is how we plan to help!   For nasal congestion, you may use an oral decongestants such as Mucinex  D or if you have glaucoma or high blood pressure use plain Mucinex .  Saline nasal spray or nasal drops can help and can safely be used as often as  needed for congestion.  For your congestion, I have prescribed Azelastine  nasal spray two sprays in each nostril twice a day, use with Flonase .  If you do not have a history of heart disease, hypertension, diabetes or thyroid  disease, prostate/bladder issues or glaucoma, you may also use Sudafed to treat nasal congestion.  It is highly recommended that you consult with a pharmacist or your primary care physician to ensure this medication is safe for you to take.     If you have a cough, you may use cough suppressants such as Delsym and Robitussin.  If you have glaucoma or high blood pressure, you can also use Coricidin HBP.   For cough I have prescribed for you A prescription cough medication called Tessalon  Perles 100 mg. You may take 1-2 capsules every 8 hours as needed for cough  If you have a sore or scratchy throat, use a saltwater gargle-  to  teaspoon of salt dissolved in a 4-ounce to 8-ounce glass of warm water.  Gargle the solution for approximately 15-30 seconds and then spit.  It is important not to swallow the solution.  You can also use throat lozenges/cough drops and Chloraseptic spray to help with throat pain or discomfort.  Warm or cold liquids can also be helpful in relieving throat pain.  For headache, pain or general discomfort, you can use Ibuprofen  or Tylenol  as directed.  Some authorities believe that zinc sprays or the use of Echinacea may shorten the course of your symptoms.   HOME CARE Only take medications as instructed by your medical team. Be sure to drink plenty of fluids. Water is fine as well as fruit juices, sodas and electrolyte beverages. You may want to stay away from caffeine or alcohol. If you are nauseated, try taking small sips of liquids. How do you know if you are getting enough fluid? Your urine should be a pale yellow or almost colorless. Get rest. Taking a steamy shower or using a humidifier may help nasal congestion and ease sore throat pain. You can  place a towel over your head and breathe in the steam from hot water coming from a faucet. Using a saline nasal spray works much the same way. Cough drops, hard candies and sore throat lozenges may ease your cough. Avoid close contacts especially the very young and the elderly Cover your mouth if you cough or sneeze Always remember to wash your hands.   GET HELP RIGHT AWAY IF: You develop worsening fever. If your symptoms do not improve within 10 days You develop yellow or green discharge from your nose over 3 days. You have coughing fits You develop a severe head ache or visual changes. You develop shortness of breath, difficulty breathing or start having chest pain Your symptoms persist after you have completed your treatment plan  MAKE SURE YOU  Understand these instructions. Will watch your condition. Will get help right away if you are not doing well or get worse.  Thank you for choosing an e-visit.  Your e-visit answers were reviewed by a board certified advanced clinical practitioner to complete your personal care plan. Depending upon the condition, your plan could have included both over the counter or prescription medications.  Please review your pharmacy choice. Make sure the pharmacy is open so you can pick up prescription now. If there is a problem, you may contact your provider through [employer] and have the prescription routed to another pharmacy.  Your safety is important to us . If you have drug allergies check your prescription carefully.   For the next 24 hours you can use MyChart to ask questions about today's visit, request a non-urgent call back, or ask for a work or school excuse. You will get an email in the next two days asking about your experience. I hope that your e-visit has been valuable and will speed your recovery.     I have spent 5 minutes in review of e-visit questionnaire, review and updating patient chart, medical decision making and  response to patient.   Angelia Kelp, PA-C

## 2023-08-28 ENCOUNTER — Other Ambulatory Visit: Payer: Self-pay | Admitting: Nurse Practitioner

## 2023-08-28 ENCOUNTER — Encounter: Payer: Self-pay | Admitting: Nurse Practitioner

## 2023-08-28 DIAGNOSIS — J069 Acute upper respiratory infection, unspecified: Secondary | ICD-10-CM

## 2023-08-28 DIAGNOSIS — J101 Influenza due to other identified influenza virus with other respiratory manifestations: Secondary | ICD-10-CM

## 2023-08-28 MED ORDER — PROMETHAZINE-DM 6.25-15 MG/5ML PO SYRP: 5.0000 mL | ORAL_SOLUTION | Freq: Four times a day (QID) | ORAL | 0 refills | Status: DC | PRN

## 2023-09-05 ENCOUNTER — Encounter: Payer: Self-pay | Admitting: Nurse Practitioner

## 2023-09-05 ENCOUNTER — Ambulatory Visit: Payer: PRIVATE HEALTH INSURANCE | Attending: Nurse Practitioner | Admitting: Nurse Practitioner

## 2023-09-05 ENCOUNTER — Other Ambulatory Visit: Payer: Self-pay

## 2023-09-05 ENCOUNTER — Other Ambulatory Visit: Payer: Self-pay | Admitting: Nurse Practitioner

## 2023-09-05 VITALS — BP 154/94 | HR 82 | Resp 19 | Ht 65.0 in | Wt 183.0 lb

## 2023-09-05 DIAGNOSIS — Z1211 Encounter for screening for malignant neoplasm of colon: Secondary | ICD-10-CM

## 2023-09-05 DIAGNOSIS — F32A Depression, unspecified: Secondary | ICD-10-CM

## 2023-09-05 DIAGNOSIS — E7800 Pure hypercholesterolemia, unspecified: Secondary | ICD-10-CM

## 2023-09-05 DIAGNOSIS — J329 Chronic sinusitis, unspecified: Secondary | ICD-10-CM

## 2023-09-05 DIAGNOSIS — B9789 Other viral agents as the cause of diseases classified elsewhere: Secondary | ICD-10-CM

## 2023-09-05 DIAGNOSIS — I10 Essential (primary) hypertension: Secondary | ICD-10-CM

## 2023-09-05 DIAGNOSIS — F419 Anxiety disorder, unspecified: Secondary | ICD-10-CM

## 2023-09-05 DIAGNOSIS — M546 Pain in thoracic spine: Secondary | ICD-10-CM | POA: Diagnosis not present

## 2023-09-05 DIAGNOSIS — F418 Other specified anxiety disorders: Secondary | ICD-10-CM

## 2023-09-05 DIAGNOSIS — Z1231 Encounter for screening mammogram for malignant neoplasm of breast: Secondary | ICD-10-CM

## 2023-09-05 DIAGNOSIS — G8929 Other chronic pain: Secondary | ICD-10-CM

## 2023-09-05 DIAGNOSIS — J019 Acute sinusitis, unspecified: Secondary | ICD-10-CM | POA: Diagnosis not present

## 2023-09-05 DIAGNOSIS — E783 Hyperchylomicronemia: Secondary | ICD-10-CM

## 2023-09-05 MED ORDER — CYCLOBENZAPRINE HCL 5 MG PO TABS
5.0000 mg | ORAL_TABLET | Freq: Three times a day (TID) | ORAL | 1 refills | Status: DC | PRN
  Filled 2023-09-05: qty 30, 10d supply, fill #0
  Filled 2024-04-08: qty 30, 10d supply, fill #1

## 2023-09-05 MED ORDER — GEMFIBROZIL 600 MG PO TABS
600.0000 mg | ORAL_TABLET | Freq: Two times a day (BID) | ORAL | 0 refills | Status: DC
  Filled 2023-09-05 – 2023-10-31 (×3): qty 180, 90d supply, fill #0

## 2023-09-05 MED ORDER — CARVEDILOL 25 MG PO TABS
25.0000 mg | ORAL_TABLET | Freq: Two times a day (BID) | ORAL | 1 refills | Status: DC
  Filled 2023-09-05 – 2023-10-31 (×2): qty 180, 90d supply, fill #0
  Filled 2024-01-27: qty 180, 90d supply, fill #1

## 2023-09-05 MED ORDER — HYDROXYZINE HCL 25 MG PO TABS
12.5000 mg | ORAL_TABLET | Freq: Three times a day (TID) | ORAL | 3 refills | Status: AC | PRN
  Filled 2023-09-05: qty 60, 20d supply, fill #0
  Filled 2024-01-27: qty 60, 20d supply, fill #1
  Filled 2024-04-08: qty 60, 20d supply, fill #2

## 2023-09-05 MED ORDER — PREDNISONE 20 MG PO TABS
20.0000 mg | ORAL_TABLET | Freq: Every day | ORAL | 0 refills | Status: AC
  Filled 2023-09-05: qty 7, 7d supply, fill #0

## 2023-09-05 MED ORDER — LOSARTAN POTASSIUM 100 MG PO TABS
100.0000 mg | ORAL_TABLET | Freq: Every day | ORAL | 1 refills | Status: DC
  Filled 2023-09-05: qty 90, 90d supply, fill #0
  Filled 2023-11-29: qty 90, 90d supply, fill #1

## 2023-09-05 MED ORDER — LOSARTAN POTASSIUM 100 MG PO TABS
100.0000 mg | ORAL_TABLET | Freq: Two times a day (BID) | ORAL | 1 refills | Status: DC
  Filled 2023-09-05: qty 90, 45d supply, fill #0

## 2023-09-05 NOTE — Progress Notes (Signed)
 Assessment & Plan:   Rebecca "Paola Bohr" was seen today for hypertension.  Diagnoses and all orders for this visit:  Primary hypertension -     CMP14+EGFR -     carvedilol  (COREG ) 25 MG tablet; Take 1 tablet (25 mg total) by mouth 2 (two) times daily with a meal. -     losartan  (COZAAR ) 100 MG tablet; Take 1 tablet (100 mg total) by mouth daily. Continue all antihypertensives as prescribed.  Reminded to bring in blood pressure log for follow  up appointment.  RECOMMENDATIONS: DASH/Mediterranean Diets are healthier choices for HTN.    Viral sinusitis -     predniSONE  (DELTASONE ) 20 MG tablet; Take 1 tablet (20 mg total) by mouth daily with breakfast for 7 days.  Hypercholesterolemia -     Lipid panel -     gemfibrozil  (LOPID ) 600 MG tablet; Take 1 tablet (600 mg total) by mouth 2 (two) times daily before a meal. For cholesterol INSTRUCTIONS: Work on a low fat, heart healthy diet and participate in regular aerobic exercise program by working out at least 150 minutes per week; 5 days a week-30 minutes per day. Avoid red meat/beef/steak,  fried foods. junk foods, sodas, sugary drinks, unhealthy snacking, alcohol and smoking.  Drink at least 80 oz of water per day and monitor your carbohydrate intake daily.    Chronic right-sided thoracic back pain Stable. Denies bowel or bladder incontinence -     cyclobenzaprine  (FLEXERIL ) 5 MG tablet; Take 1 tablet (5 mg total) by mouth 3 (three) times daily as needed for muscle spasms.  Anxiety and depression Mood is stable -     hydrOXYzine  (ATARAX ) 25 MG tablet; Take 0.5-1 tablets (12.5-25 mg total) by mouth 3 (three) times daily as needed. For anxiety  Breast cancer screening by mammogram -     MM 3D SCREENING MAMMOGRAM BILATERAL BREAST; Future  Colon cancer screening -     Ambulatory referral to Gastroenterology    Patient has been counseled on age-appropriate routine health concerns for screening and prevention. These are reviewed and  up-to-date. Referrals have been placed accordingly. Immunizations are up-to-date or declined.    Subjective:   Chief Complaint  Patient presents with   Hypertension    Rebecca Joseph 48 y.o. female presents to office today for follow up to HTN  She has a past medical history of Anemia (2018), Anxiety, COVID-19 (11/24/2020), GERD, Gout, Headache, iron deficiency anemia, Hyperlipidemia, Hypertension, Overactive bladder (2023), Uterine leiomyoma (2023), and Vitamin D  deficiency (2022).    Patient has been counseled on age-appropriate routine health concerns for screening and prevention. These are reviewed and up-to-date. Referrals have been placed accordingly. Immunizations are up-to-date or declined.     MAMMOGRAM: Overdue. REFERRAL PLACED Colon Cancer Screening: Overdue. REFERRAL PLACED PAP Smear: UTD   HTN Blood pressure not at goal today. She endorses checking her blood pressure at home today and recalls SBP 100. She is currently prescribed losartan  100 mg daily, carvedilol  25 mg BID.  BP Readings from Last 3 Encounters:  09/05/23 (!) 154/94  01/14/23 130/85  07/31/22 128/82    She is still experiencing significant sinus drainage and congestion after being treated for a sinus infection with abx in April   LDL at goal  Lab Results  Component Value Date   CHOL 116 09/05/2023   CHOL 148 07/24/2020   CHOL 129 04/27/2019   Lab Results  Component Value Date   HDL 32 (L) 09/05/2023   HDL 39 (  L) 07/24/2020   HDL 47 04/27/2019   Lab Results  Component Value Date   LDLCALC 54 09/05/2023   LDLCALC 76 07/24/2020   LDLCALC 69 04/27/2019   Lab Results  Component Value Date   TRIG 180 (H) 09/05/2023   TRIG 195 (H) 07/24/2020   TRIG 58 04/27/2019   Lab Results  Component Value Date   CHOLHDL 3.6 09/05/2023   CHOLHDL 3.8 07/24/2020   CHOLHDL 2.7 04/27/2019   No results found for: "LDLDIRECT"  Review of Systems  Constitutional:  Negative for fever,  malaise/fatigue and weight loss.  HENT:  Positive for congestion and sinus pain. Negative for nosebleeds.   Eyes: Negative.  Negative for blurred vision, double vision and photophobia.  Respiratory: Negative.  Negative for cough and shortness of breath.   Cardiovascular: Negative.  Negative for chest pain, palpitations and leg swelling.  Gastrointestinal: Negative.  Negative for heartburn, nausea and vomiting.  Musculoskeletal: Negative.  Negative for myalgias.  Neurological: Negative.  Negative for dizziness, focal weakness, seizures and headaches.  Psychiatric/Behavioral: Negative.  Negative for suicidal ideas.     Past Medical History:  Diagnosis Date   Anemia 2018   iron deficiency, 2021 Hgb 6.3, most recent CBC as of 11/14/21,  Hgb 11.4 on 04/25/21 in Epic, iron infusions in 2021   Anxiety    Follows with Winda Hastings, NP, LOV 11/13/21.   COVID-19 11/24/2020   treated with anti-viral mediciation   GERD (gastroesophageal reflux disease)    occasional Tums or Rolaids   Gout    hx of gout   Headache    occasional   Hx of iron deficiency anemia    2018 & 2021, Hgb on 04/25/21 11.4   Hyperlipidemia    As of 11/14/21, pt is currently taking Lopid .   Hypertension    Pt follows with Nazarene Bunning, NP @ [facility], LOV (video) 11/13/21.   Overactive bladder 2023   w/ urinary incontinence   Uterine leiomyoma 2023   w/ menorrhagia   Vitamin D  deficiency 2022   hx of, no longer takes supplements as of 11/21/21    Past Surgical History:  Procedure Laterality Date   CESAREAN SECTION  2004   HYSTERECTOMY ABDOMINAL WITH SALPINGECTOMY Bilateral 11/28/2021   Procedure: TOTAL ABDOMINAL HYSTERECTOMY WITH BILATERAL SALPINGECTOMY;  Surgeon: Gretchen Leavell, MD;  Location: [facility] [place];  Service: Gynecology;  Laterality: Bilateral;   IR RADIOLOGIST EVAL & MGMT  07/12/2020   for fibroids, pt did not have procedure done   TUBAL LIGATION  2004    Family History   Problem Relation Age of Onset   Hypertension Mother    Diabetes Father    Hypertension Father    Heart disease Father        CHF   Gout Father    Breast cancer Maternal Aunt    Cancer Maternal Aunt    Breast cancer Cousin    Cancer Cousin        Maternal Cousin died from breast cancer   Colon cancer Neg Hx    Rectal cancer Neg Hx    Stomach cancer Neg Hx     Social History Reviewed with no changes to be made today.   Outpatient Medications Prior to Visit  Medication Sig Dispense Refill   azelastine  (ASTELIN ) 0.1 % nasal spray Place 1 spray into both nostrils 2 (two) times daily. Use in each nostril as directed 30 mL 0   fluticasone  (FLONASE ) 50 MCG/ACT  nasal spray Place 2 sprays into both nostrils daily. 16 g 0   promethazine -dextromethorphan (PROMETHAZINE -DM) 6.25-15 MG/5ML syrup Take 5 mLs by mouth 4 (four) times daily as needed. 240 mL 0   benzonatate  (TESSALON ) 100 MG capsule Take 1-2 capsules (100-200 mg total) by mouth 3 (three) times daily as needed. 30 capsule 0   carvedilol  (COREG ) 25 MG tablet Take 1 tablet (25 mg total) by mouth 2 (two) times daily with a meal. 180 tablet 0   cyclobenzaprine  (FLEXERIL ) 5 MG tablet Take 1 tablet (5 mg total) by mouth 3 (three) times daily as needed for muscle spasms. 30 tablet 1   gemfibrozil  (LOPID ) 600 MG tablet Take 1 tablet (600 mg total) by mouth 2 (two) times daily before a meal. For cholesterol 180 tablet 0   hydrOXYzine  (ATARAX ) 25 MG tablet Take 0.5-1 tablets (12.5-25 mg total) by mouth 3 (three) times daily as needed. For anxiety 60 tablet 3   losartan  (COZAAR ) 100 MG tablet Take 0.5 tablets (50 mg total) by mouth 2 (two) times daily. Please schedule PCP appointment for more refills. 30 tablet 0   No facility-administered medications prior to visit.    Allergies  Allergen Reactions   Naproxen Shortness Of Breath and Other (See Comments)   Amlodipine  Swelling    BLE EDEMA   Hctz [Hydrochlorothiazide ] Other (See Comments)     GOUT       Objective:    BP (!) 154/94 (BP Location: Left Arm, Patient Position: Sitting, Cuff Size: Normal)   Pulse 82   Resp 19   Ht 5\' 5"  (1.651 m)   Wt 183 lb (83 kg)   LMP 11/28/2021 (Approximate)   SpO2 98%   BMI 30.45 kg/m  Wt Readings from Last 3 Encounters:  09/05/23 183 lb (83 kg)  01/14/23 181 lb (82.1 kg)  09/17/22 190 lb (86.2 kg)    Physical Exam Vitals and nursing note reviewed.  Constitutional:      Appearance: She is well-developed.  HENT:     Head: Normocephalic and atraumatic.     Nose:     Right Turbinates: Enlarged, swollen and pale.     Left Turbinates: Enlarged, swollen and pale.  Cardiovascular:     Rate and Rhythm: Normal rate and regular rhythm.     Heart sounds: Normal heart sounds. No murmur heard.    No friction rub. No gallop.  Pulmonary:     Effort: Pulmonary effort is normal. No tachypnea or respiratory distress.     Breath sounds: Normal breath sounds. No decreased breath sounds, wheezing, rhonchi or rales.  Chest:     Chest wall: No tenderness.  Abdominal:     General: Bowel sounds are normal.     Palpations: Abdomen is soft.  Musculoskeletal:        General: Normal range of motion.     Cervical back: Normal range of motion.  Skin:    General: Skin is warm and dry.  Neurological:     Mental Status: She is alert and oriented to person, place, and time.     Coordination: Coordination normal.  Psychiatric:        Behavior: Behavior normal. Behavior is cooperative.        Thought Content: Thought content normal.        Judgment: Judgment normal.          Patient has been counseled extensively about nutrition and exercise as well as the importance of adherence with medications and regular follow-up.  The patient was given clear instructions to go to ER or return to medical center if symptoms don't improve, worsen or new problems develop. The patient verbalized understanding.   Follow-up: Return in about 3 months (around  12/06/2023).   Collins Dean, FNP-BC [facility] [place], [place] [phone]   09/06/2023, 10:35 AM

## 2023-09-06 ENCOUNTER — Ambulatory Visit: Payer: Self-pay | Admitting: Nurse Practitioner

## 2023-09-06 ENCOUNTER — Encounter: Payer: Self-pay | Admitting: Nurse Practitioner

## 2023-09-06 LAB — CMP14+EGFR
ALT: 8 IU/L (ref 0–32)
AST: 14 IU/L (ref 0–40)
Albumin: 4.1 g/dL (ref 3.9–4.9)
Alkaline Phosphatase: 146 IU/L — ABNORMAL HIGH (ref 44–121)
BUN/Creatinine Ratio: 21 (ref 9–23)
BUN: 16 mg/dL (ref 6–24)
Bilirubin Total: 0.6 mg/dL (ref 0.0–1.2)
CO2: 20 mmol/L (ref 20–29)
Calcium: 9.7 mg/dL (ref 8.7–10.2)
Chloride: 104 mmol/L (ref 96–106)
Creatinine, Ser: 0.77 mg/dL (ref 0.57–1.00)
Globulin, Total: 3.2 g/dL (ref 1.5–4.5)
Glucose: 102 mg/dL — ABNORMAL HIGH (ref 70–99)
Potassium: 4.2 mmol/L (ref 3.5–5.2)
Sodium: 139 mmol/L (ref 134–144)
Total Protein: 7.3 g/dL (ref 6.0–8.5)
eGFR: 95 mL/min/{1.73_m2} (ref >59)

## 2023-09-06 LAB — LIPID PANEL
Chol/HDL Ratio: 3.6 ratio (ref 0.0–4.4)
Cholesterol, Total: 116 mg/dL (ref 100–199)
HDL: 32 mg/dL — ABNORMAL LOW (ref >39)
LDL Chol Calc (NIH): 54 mg/dL (ref 0–99)
Triglycerides: 180 mg/dL — ABNORMAL HIGH (ref 0–149)
VLDL Cholesterol Cal: 30 mg/dL (ref 5–40)

## 2023-09-25 ENCOUNTER — Other Ambulatory Visit: Payer: Self-pay

## 2023-10-07 ENCOUNTER — Encounter: Payer: Self-pay | Admitting: Nurse Practitioner

## 2023-10-26 ENCOUNTER — Telehealth: Payer: PRIVATE HEALTH INSURANCE | Admitting: Physician Assistant

## 2023-10-26 DIAGNOSIS — L03012 Cellulitis of left finger: Secondary | ICD-10-CM | POA: Diagnosis not present

## 2023-10-27 ENCOUNTER — Other Ambulatory Visit: Payer: Self-pay

## 2023-10-27 MED ORDER — CEPHALEXIN 500 MG PO CAPS
500.0000 mg | ORAL_CAPSULE | Freq: Four times a day (QID) | ORAL | 0 refills | Status: AC
  Filled 2023-10-27: qty 20, 5d supply, fill #0

## 2023-10-27 NOTE — Progress Notes (Signed)
 E-Visit for Cellulitis  We are sorry that you are not feeling well. Here is how we plan to help!  Based on what you shared with me it looks like you have cellulitis.  Cellulitis looks like areas of skin redness, swelling, and warmth; it develops as a result of bacteria entering under the skin. Little red spots and/or bleeding can be seen in skin, and tiny surface sacs containing fluid can occur. Fever can be present.   I have prescribed:  Keflex  500mg  take one by mouth four times a day for 5 days  HOME CARE:  Take your medications as ordered and take all of them, even if the skin irritation appears to be healing.   GET HELP RIGHT AWAY IF:  Symptoms that don't begin to go away within 48 hours. Severe redness persists or worsens If the area turns color, spreads or swells. If it blisters and opens, develops yellow-brown crust or bleeds. You develop a fever or chills. If the pain increases or becomes unbearable.  Are unable to keep fluids and food down.  MAKE SURE YOU   Understand these instructions. Will watch your condition. Will get help right away if you are not doing well or get worse.  Thank you for choosing an e-visit.  Your e-visit answers were reviewed by a board certified advanced clinical practitioner to complete your personal care plan. Depending upon the condition, your plan could have included both over the counter or prescription medications.  Please review your pharmacy choice. Make sure the pharmacy is open so you can pick up prescription now. If there is a problem, you may contact your provider through [employer] and have the prescription routed to another pharmacy.  Your safety is important to us . If you have drug allergies check your prescription carefully.   For the next 24 hours you can use MyChart to ask questions about today's visit, request a non-urgent call back, or ask for a work or school excuse. You will get an email in the next two days asking about  your experience. I hope that your e-visit has been valuable and will speed your recovery.   I have spent 5 minutes in review of e-visit questionnaire, review and updating patient chart, medical decision making and response to patient.   Delon CHRISTELLA Dickinson, PA-C

## 2023-10-31 ENCOUNTER — Other Ambulatory Visit: Payer: Self-pay

## 2023-11-03 ENCOUNTER — Other Ambulatory Visit: Payer: Self-pay

## 2023-11-05 ENCOUNTER — Encounter: Payer: Self-pay | Admitting: Nurse Practitioner

## 2023-11-07 ENCOUNTER — Other Ambulatory Visit: Payer: Self-pay | Admitting: Nurse Practitioner

## 2023-11-07 MED ORDER — NEOMYCIN-POLYMYXIN-DEXAMETH 3.5-10000-0.1 OP SUSP: 1.0000 [drp] | Freq: Four times a day (QID) | OPHTHALMIC | 0 refills | Status: AC

## 2023-11-10 ENCOUNTER — Ambulatory Visit: Payer: PRIVATE HEALTH INSURANCE

## 2023-11-13 ENCOUNTER — Ambulatory Visit: Payer: PRIVATE HEALTH INSURANCE

## 2023-11-21 ENCOUNTER — Encounter: Payer: Self-pay | Admitting: Nurse Practitioner

## 2023-12-01 ENCOUNTER — Ambulatory Visit
Admission: RE | Admit: 2023-12-01 | Discharge: 2023-12-01 | Disposition: A | Discharge status: Home / Self Care | Payer: PRIVATE HEALTH INSURANCE | Source: Ambulatory Visit | Attending: Nurse Practitioner | Admitting: Nurse Practitioner

## 2023-12-01 ENCOUNTER — Ambulatory Visit: Payer: PRIVATE HEALTH INSURANCE

## 2023-12-01 DIAGNOSIS — Z1231 Encounter for screening mammogram for malignant neoplasm of breast: Secondary | ICD-10-CM

## 2023-12-04 ENCOUNTER — Other Ambulatory Visit (HOSPITAL_COMMUNITY): Payer: Self-pay

## 2023-12-04 ENCOUNTER — Ambulatory Visit: Payer: Self-pay | Admitting: Nurse Practitioner

## 2023-12-05 ENCOUNTER — Other Ambulatory Visit: Payer: Self-pay

## 2024-01-05 ENCOUNTER — Telehealth: Payer: PRIVATE HEALTH INSURANCE | Admitting: Family Medicine

## 2024-01-05 DIAGNOSIS — R197 Diarrhea, unspecified: Secondary | ICD-10-CM

## 2024-01-05 NOTE — Progress Notes (Signed)

## 2024-01-10 ENCOUNTER — Encounter: Payer: Self-pay | Admitting: Nurse Practitioner

## 2024-01-27 ENCOUNTER — Other Ambulatory Visit: Payer: Self-pay | Admitting: Nurse Practitioner

## 2024-01-27 DIAGNOSIS — E783 Hyperchylomicronemia: Secondary | ICD-10-CM

## 2024-01-28 ENCOUNTER — Other Ambulatory Visit: Payer: Self-pay

## 2024-01-28 MED ORDER — GEMFIBROZIL 600 MG PO TABS
600.0000 mg | ORAL_TABLET | Freq: Two times a day (BID) | ORAL | 0 refills | Status: DC
  Filled 2024-01-28: qty 60, 30d supply, fill #0

## 2024-01-30 ENCOUNTER — Other Ambulatory Visit: Payer: Self-pay

## 2024-03-09 ENCOUNTER — Other Ambulatory Visit: Payer: Self-pay | Admitting: Nurse Practitioner

## 2024-03-09 ENCOUNTER — Other Ambulatory Visit: Payer: Self-pay | Admitting: Family Medicine

## 2024-03-09 DIAGNOSIS — I10 Essential (primary) hypertension: Secondary | ICD-10-CM

## 2024-03-09 DIAGNOSIS — E783 Hyperchylomicronemia: Secondary | ICD-10-CM

## 2024-03-11 ENCOUNTER — Other Ambulatory Visit: Payer: Self-pay

## 2024-03-11 ENCOUNTER — Other Ambulatory Visit: Payer: Self-pay | Admitting: Family Medicine

## 2024-03-11 ENCOUNTER — Other Ambulatory Visit: Payer: Self-pay | Admitting: Nurse Practitioner

## 2024-03-11 DIAGNOSIS — I10 Essential (primary) hypertension: Secondary | ICD-10-CM

## 2024-03-11 DIAGNOSIS — E783 Hyperchylomicronemia: Secondary | ICD-10-CM

## 2024-03-11 MED ORDER — LOSARTAN POTASSIUM 100 MG PO TABS
100.0000 mg | ORAL_TABLET | Freq: Every day | ORAL | 0 refills | Status: DC
  Filled 2024-03-11: qty 30, 30d supply, fill #0

## 2024-03-11 MED ORDER — GEMFIBROZIL 600 MG PO TABS
600.0000 mg | ORAL_TABLET | Freq: Two times a day (BID) | ORAL | 0 refills | Status: DC
  Filled 2024-03-11: qty 60, 30d supply, fill #0

## 2024-03-25 ENCOUNTER — Encounter: Payer: Self-pay | Admitting: Nurse Practitioner

## 2024-03-26 ENCOUNTER — Telehealth: Payer: PRIVATE HEALTH INSURANCE | Admitting: Physician Assistant

## 2024-03-26 ENCOUNTER — Other Ambulatory Visit: Payer: Self-pay

## 2024-03-26 DIAGNOSIS — J019 Acute sinusitis, unspecified: Secondary | ICD-10-CM | POA: Diagnosis not present

## 2024-03-26 DIAGNOSIS — B9689 Other specified bacterial agents as the cause of diseases classified elsewhere: Secondary | ICD-10-CM | POA: Diagnosis not present

## 2024-03-26 MED ORDER — AMOXICILLIN-POT CLAVULANATE 875-125 MG PO TABS
1.0000 | ORAL_TABLET | Freq: Two times a day (BID) | ORAL | 0 refills | Status: DC
  Filled 2024-03-26: qty 14, 7d supply, fill #0

## 2024-03-26 NOTE — Telephone Encounter (Signed)
VIDEO VISIT.

## 2024-03-26 NOTE — Progress Notes (Signed)

## 2024-03-29 ENCOUNTER — Ambulatory Visit: Payer: Self-pay | Admitting: Nurse Practitioner

## 2024-03-29 ENCOUNTER — Encounter: Payer: Self-pay | Admitting: Nurse Practitioner

## 2024-03-29 DIAGNOSIS — M25531 Pain in right wrist: Secondary | ICD-10-CM | POA: Diagnosis not present

## 2024-03-29 DIAGNOSIS — M79641 Pain in right hand: Secondary | ICD-10-CM

## 2024-03-29 MED ORDER — DICLOFENAC SODIUM 1 % EX GEL: 4.0000 g | Freq: Four times a day (QID) | CUTANEOUS | 1 refills | Status: AC

## 2024-03-29 NOTE — Progress Notes (Signed)
 " Virtual Visit Consent   Rebecca Joseph, you are scheduled for a virtual visit with a [facility] provider today. Just as with appointments in the office, your consent must be obtained to participate. Your consent will be active for this visit and any virtual visit you may have with one of our providers in the next 365 days. If you have a MyChart account, a copy of this consent can be sent to you electronically.  As this is a virtual visit, video technology does not allow for your provider to perform a traditional examination. This may limit your provider's ability to fully assess your condition. If your provider identifies any concerns that need to be evaluated in person or the need to arrange testing (such as labs, EKG, etc.), we will make arrangements to do so. Although advances in technology are sophisticated, we cannot ensure that it will always work on either your end or our end. If the connection with a video visit is poor, the visit may have to be switched to a telephone visit. With either a video or telephone visit, we are not always able to ensure that we have a secure connection.  By engaging in this virtual visit, you consent to the provision of healthcare and authorize for your insurance to be billed (if applicable) for the services provided during this visit. Depending on your insurance coverage, you may receive a charge related to this service.  I need to obtain your verbal consent now. Are you willing to proceed with your visit today? Rebecca Joseph has provided verbal consent on 03/29/2024 for a virtual visit (video or telephone). Haze LELON Servant, NP  Date: 03/29/2024 4:56 PM   Virtual Visit via Video Note   I, Haze LELON Servant, connected with  Rebecca Joseph  ([id number], Mar 11, 1976) on 03/29/2024 at  4:10 PM EST by a video-enabled telemedicine application and verified that I am speaking with the correct person using two identifiers.  Location: Patient:  Virtual Visit Location Patient: Home Provider: Virtual Visit Location Provider: Home Office   I discussed the limitations of evaluation and management by telemedicine and the availability of in person appointments. The patient expressed understanding and agreed to proceed.    History of Present Illness: Rebecca Joseph is a 48 y.o. who identifies as a female who was assigned female at birth, and is being seen today for right hand and wrist pain  Carpal Tunnel Syndrome: Patient presents for presents evaluation of pain in hands and possible carpal tunnel syndrome.  Onset of the symptoms was about a month ago. Current symptoms include pain involving the right wrist and right hand  with moderate severity and weakness involving both the hand and wrist. Inciting event/aggravating factors: repetitive activity: works at [employer] Patient's course of dk:hmjiljoob worsening. Evaluation to date: none.  Treatment to date: none. .  Carpal tunnel right wrist pain and radsating to right hand.  Repetitive use.  [employer]  Pain started on and off a month or so Pain  Problems:  Patient Active Problem List   Diagnosis Date Noted   Fibroid uterus 11/28/2021   Hypercholesterolemia 08/21/2021   Hypertensive disorder 08/21/2021   Uterine leiomyoma 08/21/2021   Incontinence in female 05/12/2020   Essential hypertension 02/09/2020   Submucous leiomyoma of uterus 11/01/2019   Menorrhagia 06/07/2019   Gout of right foot 06/03/2018   Adenomyosis 05/30/2017   ANEMIA 04/29/2008   Anemia 04/29/2008   OBESITY 04/27/2008   TOBACCO ABUSE 02/18/2008  Allergies: Allergies[1] Medications: Current Medications[2]  Observations/Objective: Patient is well-developed, well-nourished in no acute distress.  Resting comfortably at home.  Head is normocephalic, atraumatic.  No labored breathing.  Speech is clear and coherent with logical content.  Patient is alert and oriented at baseline.    Assessment and Plan: 1.  Right wrist pain (Primary) - diclofenac  Sodium (VOLTAREN ) 1 % GEL; Apply 4 g topically 4 (four) times daily.  Dispense: 200 g; Refill: 1 - Ambulatory referral to Hand Surgery  2. Right hand pain - diclofenac  Sodium (VOLTAREN ) 1 % GEL; Apply 4 g topically 4 (four) times daily.  Dispense: 200 g; Refill: 1 - Ambulatory referral to Hand Surgery  Recommend right wrist splint for 4-6 weeks Limited with NSAIDs as she has allergy to naproxen  Follow Up Instructions: I discussed the assessment and treatment plan with the patient. The patient was provided an opportunity to ask questions and all were answered. The patient agreed with the plan and demonstrated an understanding of the instructions.  A copy of instructions were sent to the patient via MyChart unless otherwise noted below.    The patient was advised to call back or seek an in-person evaluation if the symptoms worsen or if the condition fails to improve as anticipated.    Haze LELON Servant, NP     [1]  Allergies Allergen Reactions   Naproxen Shortness Of Breath and Other (See Comments)   Amlodipine  Swelling    BLE EDEMA   Hctz [Hydrochlorothiazide ] Other (See Comments)    GOUT  [2]  Current Outpatient Medications:    diclofenac  Sodium (VOLTAREN ) 1 % GEL, Apply 4 g topically 4 (four) times daily., Disp: 200 g, Rfl: 1   amoxicillin -clavulanate (AUGMENTIN ) 875-125 MG tablet, Take 1 tablet by mouth 2 (two) times daily., Disp: 14 tablet, Rfl: 0   azelastine  (ASTELIN ) 0.1 % nasal spray, Place 1 spray into both nostrils 2 (two) times daily. Use in each nostril as directed, Disp: 30 mL, Rfl: 0   carvedilol  (COREG ) 25 MG tablet, Take 1 tablet (25 mg total) by mouth 2 (two) times daily with a meal., Disp: 180 tablet, Rfl: 1   cyclobenzaprine  (FLEXERIL ) 5 MG tablet, Take 1 tablet (5 mg total) by mouth 3 (three) times daily as needed for muscle spasms., Disp: 30 tablet, Rfl: 1   fluticasone  (FLONASE ) 50 MCG/ACT nasal spray, Place 2 sprays into  both nostrils daily., Disp: 16 g, Rfl: 0   gemfibrozil  (LOPID ) 600 MG tablet, Take 1 tablet (600 mg total) by mouth 2 (two) times daily before a meal. For cholesterol, Disp: 60 tablet, Rfl: 0   hydrOXYzine  (ATARAX ) 25 MG tablet, Take 0.5-1 tablets (12.5-25 mg total) by mouth 3 (three) times daily as needed. For anxiety, Disp: 60 tablet, Rfl: 3   losartan  (COZAAR ) 100 MG tablet, Take 1 tablet (100 mg total) by mouth daily., Disp: 30 tablet, Rfl: 0   neomycin -polymyxin b-dexamethasone  (MAXITROL) 3.5-10000-0.1 SUSP, Place 1 drop into the left eye every 6 (six) hours., Disp: 5 mL, Rfl: 0  "

## 2024-04-08 ENCOUNTER — Other Ambulatory Visit: Payer: Self-pay | Admitting: Family Medicine

## 2024-04-08 DIAGNOSIS — E783 Hyperchylomicronemia: Secondary | ICD-10-CM

## 2024-04-08 DIAGNOSIS — I10 Essential (primary) hypertension: Secondary | ICD-10-CM

## 2024-04-09 ENCOUNTER — Other Ambulatory Visit: Payer: Self-pay

## 2024-04-09 MED ORDER — LOSARTAN POTASSIUM 100 MG PO TABS
100.0000 mg | ORAL_TABLET | Freq: Every day | ORAL | 0 refills | Status: DC
  Filled 2024-04-09: qty 30, 30d supply, fill #0

## 2024-04-09 MED ORDER — GEMFIBROZIL 600 MG PO TABS
600.0000 mg | ORAL_TABLET | Freq: Two times a day (BID) | ORAL | 0 refills | Status: DC
  Filled 2024-04-09: qty 60, 30d supply, fill #0

## 2024-04-21 ENCOUNTER — Telehealth: Payer: Self-pay | Admitting: Nurse Practitioner

## 2024-04-21 NOTE — Telephone Encounter (Signed)
 Contacted pt left vm to confirmed appt (per vr)

## 2024-04-23 ENCOUNTER — Encounter: Payer: Self-pay | Admitting: Nurse Practitioner

## 2024-04-23 ENCOUNTER — Ambulatory Visit: Attending: Nurse Practitioner | Admitting: Nurse Practitioner

## 2024-04-23 ENCOUNTER — Other Ambulatory Visit: Payer: Self-pay

## 2024-04-23 VITALS — BP 125/84 | HR 76 | Ht 65.0 in | Wt 177.6 lb

## 2024-04-23 DIAGNOSIS — E783 Hyperchylomicronemia: Secondary | ICD-10-CM

## 2024-04-23 DIAGNOSIS — I10 Essential (primary) hypertension: Secondary | ICD-10-CM

## 2024-04-23 DIAGNOSIS — R7989 Other specified abnormal findings of blood chemistry: Secondary | ICD-10-CM

## 2024-04-23 DIAGNOSIS — Z23 Encounter for immunization: Secondary | ICD-10-CM

## 2024-04-23 DIAGNOSIS — G8929 Other chronic pain: Secondary | ICD-10-CM

## 2024-04-23 MED ORDER — CARVEDILOL 25 MG PO TABS
25.0000 mg | ORAL_TABLET | Freq: Two times a day (BID) | ORAL | 1 refills | Status: AC
  Filled 2024-04-23: qty 180, 90d supply, fill #0

## 2024-04-23 MED ORDER — CYCLOBENZAPRINE HCL 5 MG PO TABS
5.0000 mg | ORAL_TABLET | Freq: Three times a day (TID) | ORAL | 1 refills | Status: AC | PRN
  Filled 2024-04-23: qty 30, 10d supply, fill #0

## 2024-04-23 MED ORDER — GEMFIBROZIL 600 MG PO TABS
600.0000 mg | ORAL_TABLET | Freq: Two times a day (BID) | ORAL | 1 refills | Status: AC
  Filled 2024-04-23: qty 180, 90d supply, fill #0

## 2024-04-23 MED ORDER — VALSARTAN 80 MG PO TABS
80.0000 mg | ORAL_TABLET | Freq: Every day | ORAL | 3 refills | Status: AC
  Filled 2024-04-23: qty 90, 90d supply, fill #0

## 2024-04-23 NOTE — Progress Notes (Unsigned)
 "  Assessment & Plan:  Rebecca Joseph was seen today for hypertension.  Diagnoses and all orders for this visit:  Primary hypertension -     carvedilol  (COREG ) 25 MG tablet; Take 1 tablet (25 mg total) by mouth 2 (two) times daily with a meal. -     valsartan  (DIOVAN ) 80 MG tablet; Take 1 tablet (80 mg total) by mouth daily. -     CMP14+EGFR  Chronic right-sided thoracic back pain -     cyclobenzaprine  (FLEXERIL ) 5 MG tablet; Take 1 tablet (5 mg total) by mouth 3 (three) times daily as needed for muscle spasms.  Mixed hyperglyceridemia -     gemfibrozil  (LOPID ) 600 MG tablet; Take 1 tablet (600 mg total) by mouth 2 (two) times daily before a meal. -     Lipid panel  Need for hepatitis B vaccination -     Heplisav-B  (HepB-CPG) Vaccine  Abnormal CBC -     CBC with Differential    Patient has been counseled on age-appropriate routine health concerns for screening and prevention. These are reviewed and up-to-date. Referrals have been placed accordingly. Immunizations are up-to-date or declined.    Subjective:   Chief Complaint  Patient presents with   Hypertension    Rebecca Joseph 49 y.o. female presents to office today f,   BP Readings from Last 3 Encounters:  04/23/24 125/84  09/05/23 (!) 154/94  01/14/23 130/85     BP at home. 109/60  ROS  Past Medical History:  Diagnosis Date   Anemia 2018   iron deficiency, 2021 Hgb 6.3, most recent CBC as of 11/14/21,  Hgb 11.4 on 04/25/21 in Epic, iron infusions in 2021   Anxiety    Follows with Haze Servant, NP, LOV 11/13/21.   COVID-19 11/24/2020   treated with anti-viral mediciation   GERD (gastroesophageal reflux disease)    occasional Tums or Rolaids   Gout    hx of gout   Headache    occasional   Hx of iron deficiency anemia    2018 & 2021, Hgb on 04/25/21 11.4   Hyperlipidemia    As of 11/14/21, pt is currently taking Lopid .   Hypertension    Pt follows with Giorgio Chabot, NP @ [facility], LOV (video) 11/13/21.   Overactive bladder 2023   w/ urinary incontinence   Uterine leiomyoma 2023   w/ menorrhagia   Vitamin D  deficiency 2022   hx of, no longer takes supplements as of 11/21/21    Past Surgical History:  Procedure Laterality Date   CESAREAN SECTION  2004   HYSTERECTOMY ABDOMINAL WITH SALPINGECTOMY Bilateral 11/28/2021   Procedure: TOTAL ABDOMINAL HYSTERECTOMY WITH BILATERAL SALPINGECTOMY;  Surgeon: Lequita Evalene LABOR, MD;  Location: [facility];  Service: Gynecology;  Laterality: Bilateral;   IR RADIOLOGIST EVAL & MGMT  07/12/2020   for fibroids, pt did not have procedure done   TUBAL LIGATION  2004    Family History  Problem Relation Age of Onset   Hypertension Mother    Diabetes Father    Hypertension Father    Heart disease Father        CHF   Gout Father    Breast cancer Maternal Aunt    Cancer Maternal Aunt    Breast cancer Cousin    Cancer Cousin        Maternal Cousin died from breast cancer   Colon cancer Neg Hx    Rectal cancer Neg Hx  Stomach cancer Neg Hx     Social History Reviewed with no changes to be made today.   Outpatient Medications Prior to Visit  Medication Sig Dispense Refill   azelastine  (ASTELIN ) 0.1 % nasal spray Place 1 spray into both nostrils 2 (two) times daily. Use in each nostril as directed 30 mL 0   diclofenac  Sodium (VOLTAREN ) 1 % GEL Apply 4 g topically 4 (four) times daily. 200 g 1   fluticasone  (FLONASE ) 50 MCG/ACT nasal spray Place 2 sprays into both nostrils daily. 16 g 0   hydrOXYzine  (ATARAX ) 25 MG tablet Take 0.5-1 tablets (12.5-25 mg total) by mouth 3 (three) times daily as needed. For anxiety 60 tablet 3   carvedilol  (COREG ) 25 MG tablet Take 1 tablet (25 mg total) by mouth 2 (two) times daily with a meal. 180 tablet 1   cyclobenzaprine  (FLEXERIL ) 5 MG tablet Take 1 tablet (5 mg total) by mouth 3 (three) times daily as needed for muscle spasms. 30  tablet 1   gemfibrozil  (LOPID ) 600 MG tablet Take 1 tablet (600 mg total) by mouth 2 (two) times daily before a meal. For cholesterol.Must have office visit for refills 60 tablet 0   losartan  (COZAAR ) 100 MG tablet Take 1 tablet (100 mg total) by mouth daily. 30 tablet 0   neomycin -polymyxin b-dexamethasone  (MAXITROL) 3.5-10000-0.1 SUSP Place 1 drop into the left eye every 6 (six) hours. (Patient not taking: Reported on 04/23/2024) 5 mL 0   amoxicillin -clavulanate (AUGMENTIN ) 875-125 MG tablet Take 1 tablet by mouth 2 (two) times daily. (Patient not taking: Reported on 04/23/2024) 14 tablet 0   No facility-administered medications prior to visit.    Allergies[1]     Objective:    BP 125/84 (BP Location: Left Arm, Patient Position: Sitting, Cuff Size: Normal)   Pulse 76   Ht 5' 5 (1.651 m)   Wt 177 lb 9.6 oz (80.6 kg)   LMP 11/28/2021   SpO2 100%   BMI 29.55 kg/m  Wt Readings from Last 3 Encounters:  04/23/24 177 lb 9.6 oz (80.6 kg)  09/05/23 183 lb (83 kg)  01/14/23 181 lb (82.1 kg)    Physical Exam       Patient has been counseled extensively about nutrition and exercise as well as the importance of adherence with medications and regular follow-up. The patient was given clear instructions to go to ER or return to medical center if symptoms don't improve, worsen or new problems develop. The patient verbalized understanding.   Follow-up: Return in about 3 months (around 07/22/2024).   Haze LELON Servant, FNP-BC [facility], [place] [phone]   04/23/2024, 12:07 PM      [1] Allergies Allergen Reactions   Naproxen Shortness Of Breath and Other (See Comments)   Amlodipine  Swelling    BLE EDEMA   Hctz [Hydrochlorothiazide ] Other (See Comments)    GOUT  "

## 2024-04-23 NOTE — Patient Instructions (Signed)
 [facility] [address] [place], [place] [address] ?PH# [phone] ?

## 2024-04-23 NOTE — Progress Notes (Unsigned)
 Itchy throat started one week ago.

## 2024-04-24 ENCOUNTER — Ambulatory Visit: Payer: Self-pay | Admitting: Nurse Practitioner

## 2024-04-24 LAB — CBC WITH DIFFERENTIAL/PLATELET
Basophils Absolute: 0 x10E3/uL (ref 0.0–0.2)
Basos: 0 %
EOS (ABSOLUTE): 0.1 x10E3/uL (ref 0.0–0.4)
Eos: 2 %
Hematocrit: 38.7 % (ref 34.0–46.6)
Hemoglobin: 12.2 g/dL (ref 11.1–15.9)
Immature Grans (Abs): 0 x10E3/uL (ref 0.0–0.1)
Immature Granulocytes: 0 %
Lymphocytes Absolute: 1.1 x10E3/uL (ref 0.7–3.1)
Lymphs: 20 %
MCH: 27.8 pg (ref 26.6–33.0)
MCHC: 31.5 g/dL (ref 31.5–35.7)
MCV: 88 fL (ref 79–97)
Monocytes Absolute: 0.4 x10E3/uL (ref 0.1–0.9)
Monocytes: 7 %
Neutrophils Absolute: 3.8 x10E3/uL (ref 1.4–7.0)
Neutrophils: 71 %
Platelets: 272 x10E3/uL (ref 150–450)
RBC: 4.39 x10E6/uL (ref 3.77–5.28)
RDW: 13.9 % (ref 11.7–15.4)
WBC: 5.5 x10E3/uL (ref 3.4–10.8)

## 2024-04-24 LAB — CMP14+EGFR
ALT: 10 IU/L (ref 0–32)
AST: 16 IU/L (ref 0–40)
Albumin: 4.2 g/dL (ref 3.9–4.9)
Alkaline Phosphatase: 118 IU/L — ABNORMAL HIGH (ref 41–116)
BUN/Creatinine Ratio: 21 (ref 9–23)
BUN: 14 mg/dL (ref 6–24)
Bilirubin Total: 0.6 mg/dL (ref 0.0–1.2)
CO2: 21 mmol/L (ref 20–29)
Calcium: 9.5 mg/dL (ref 8.7–10.2)
Chloride: 104 mmol/L (ref 96–106)
Creatinine, Ser: 0.67 mg/dL (ref 0.57–1.00)
Globulin, Total: 3 g/dL (ref 1.5–4.5)
Glucose: 96 mg/dL (ref 70–99)
Potassium: 4.7 mmol/L (ref 3.5–5.2)
Sodium: 138 mmol/L (ref 134–144)
Total Protein: 7.2 g/dL (ref 6.0–8.5)
eGFR: 108 mL/min/1.73

## 2024-04-24 LAB — LIPID PANEL
Chol/HDL Ratio: 2.8 ratio (ref 0.0–4.4)
Cholesterol, Total: 126 mg/dL (ref 100–199)
HDL: 45 mg/dL
LDL Chol Calc (NIH): 65 mg/dL (ref 0–99)
Triglycerides: 81 mg/dL (ref 0–149)
VLDL Cholesterol Cal: 16 mg/dL (ref 5–40)

## 2024-04-27 ENCOUNTER — Ambulatory Visit: Admitting: Orthopedic Surgery

## 2024-04-27 NOTE — Progress Notes (Unsigned)
 "  Rebecca Joseph - 49 y.o. female MRN [id number]  Date of birth: [birth year]  Office Visit Note: Visit Date: 04/27/2024 PCP: Theotis Haze ORN, NP Referred by: Theotis Haze ORN, NP  Subjective: No chief complaint on file.  HPI: Rebecca Joseph is a pleasant 49 y.o. female who presents today for ***  Pertinent ROS were reviewed with the patient and found to be negative unless otherwise specified above in HPI.   Visit Reason: Duration of symptoms: Hand dominance: {RIGHT/LEFT:20294} Occupation: Diabetic: {yes/no:20286} Smoking: {yes/no:20286} Heart/Lung History: Blood Thinners:   Prior Testing/EMG: Injections (Date): Treatments: Prior Surgery:    Assessment & Plan: Visit Diagnoses: No diagnosis found.  Plan: ***  Follow-up: No follow-ups on file.   Meds & Orders: No orders of the defined types were placed in this encounter.  No orders of the defined types were placed in this encounter.    Procedures: No procedures performed      Clinical History: No specialty comments available.  She reports that she quit smoking about 31 years ago. Her smoking use included cigarettes. She started smoking about 33 years ago. She has a 0.3 pack-year smoking history. She has never used smokeless tobacco. No results for input(s): HGBA1C, LABURIC in the last 8760 hours.  Objective:   Vital Signs: LMP 11/28/2021   Physical Exam  Gen: Well-appearing, in no acute distress; non-toxic CV: Regular Rate. Well-perfused. Warm.  Resp: Breathing unlabored on room air; no wheezing. Psych: Fluid speech in conversation; appropriate affect; normal thought process  Ortho Exam - ***   Imaging: No results found.  Past Medical/Family/Surgical/Social History: Medications & Allergies reviewed per EMR, new medications updated. Patient Active Problem List   Diagnosis Date Noted   Fibroid uterus 11/28/2021   Hypercholesterolemia 08/21/2021   Hypertensive disorder 08/21/2021    Uterine leiomyoma 08/21/2021   Incontinence in female 05/12/2020   Essential hypertension 02/09/2020   Submucous leiomyoma of uterus 11/01/2019   Menorrhagia 06/07/2019   Gout of right foot 06/03/2018   Adenomyosis 05/30/2017   ANEMIA 04/29/2008   Anemia 04/29/2008   OBESITY 04/27/2008   TOBACCO ABUSE 02/18/2008   Past Medical History:  Diagnosis Date   Anemia 2018   iron deficiency, 2021 Hgb 6.3, most recent CBC as of 11/14/21,  Hgb 11.4 on 04/25/21 in Epic, iron infusions in 2021   Anxiety    Follows with Haze Theotis, NP, LOV 11/13/21.   COVID-19 11/24/2020   treated with anti-viral mediciation   GERD (gastroesophageal reflux disease)    occasional Tums or Rolaids   Gout    hx of gout   Headache    occasional   Hx of iron deficiency anemia    2018 & 2021, Hgb on 04/25/21 11.4   Hyperlipidemia    As of 11/14/21, pt is currently taking Lopid .   Hypertension    Pt follows with Zelda Fleming, NP @ [facility], LOV (video) 11/13/21.   Overactive bladder 2023   w/ urinary incontinence   Uterine leiomyoma 2023   w/ menorrhagia   Vitamin D  deficiency 2022   hx of, no longer takes supplements as of 11/21/21   Family History  Problem Relation Age of Onset   Hypertension Mother    Diabetes Father    Hypertension Father    Heart disease Father        CHF   Gout Father    Breast cancer Maternal Aunt    Cancer Maternal Aunt    Breast cancer  Cousin    Cancer Cousin        Maternal Cousin died from breast cancer   Colon cancer Neg Hx    Rectal cancer Neg Hx    Stomach cancer Neg Hx    Past Surgical History:  Procedure Laterality Date   CESAREAN SECTION  2004   HYSTERECTOMY ABDOMINAL WITH SALPINGECTOMY Bilateral 11/28/2021   Procedure: TOTAL ABDOMINAL HYSTERECTOMY WITH BILATERAL SALPINGECTOMY;  Surgeon: Lequita Evalene LABOR, MD;  Location: [facility];  Service: Gynecology;  Laterality: Bilateral;   IR RADIOLOGIST EVAL & MGMT  07/12/2020   for  fibroids, pt did not have procedure done   TUBAL LIGATION  2004   Social History   Occupational History   Not on file  Tobacco Use   Smoking status: Former    Current packs/day: 0.00    Average packs/day: 0.1 packs/day for 2.0 years (0.3 ttl pk-yrs)    Types: Cigarettes    Start date: 30    Quit date: 1995    Years since quitting: 31.0   Smokeless tobacco: Never  Vaping Use   Vaping status: Never Used  Substance and Sexual Activity   Alcohol use: Yes    Comment: about 2 shots per day as of 11/21/21   Drug use: No   Sexual activity: Yes    Birth control/protection: Surgical    Comment: tubal ligation    Anshul Afton Alderton, M.D. [facility], Hand Surgery  "

## 2024-05-08 ENCOUNTER — Encounter: Payer: Self-pay | Admitting: Nurse Practitioner

## 2024-07-23 ENCOUNTER — Ambulatory Visit: Payer: Self-pay | Admitting: Nurse Practitioner
